# Patient Record
Sex: Female | Born: 1940 | Race: White | Hispanic: No | Smoking: Never smoker
Health system: Southern US, Community
[De-identification: ages and names within clinical notes are randomized; demographics above are authoritative.]

## PROBLEM LIST (undated history)

## (undated) DIAGNOSIS — M899 Disorder of bone, unspecified: Secondary | ICD-10-CM

## (undated) DIAGNOSIS — K219 Gastro-esophageal reflux disease without esophagitis: Secondary | ICD-10-CM

## (undated) DIAGNOSIS — A318 Other mycobacterial infections: Secondary | ICD-10-CM

## (undated) DIAGNOSIS — E039 Hypothyroidism, unspecified: Secondary | ICD-10-CM

## (undated) DIAGNOSIS — D131 Benign neoplasm of stomach: Secondary | ICD-10-CM

## (undated) DIAGNOSIS — M949 Disorder of cartilage, unspecified: Secondary | ICD-10-CM

## (undated) DIAGNOSIS — Z860100 Personal history of colon polyps, unspecified: Secondary | ICD-10-CM

## (undated) DIAGNOSIS — F411 Generalized anxiety disorder: Secondary | ICD-10-CM

## (undated) DIAGNOSIS — J479 Bronchiectasis, uncomplicated: Secondary | ICD-10-CM

## (undated) DIAGNOSIS — R51 Headache: Secondary | ICD-10-CM

## (undated) DIAGNOSIS — E785 Hyperlipidemia, unspecified: Secondary | ICD-10-CM

## (undated) DIAGNOSIS — M1990 Unspecified osteoarthritis, unspecified site: Secondary | ICD-10-CM

## (undated) DIAGNOSIS — M109 Gout, unspecified: Secondary | ICD-10-CM

## (undated) DIAGNOSIS — R519 Headache, unspecified: Secondary | ICD-10-CM

## (undated) DIAGNOSIS — J309 Allergic rhinitis, unspecified: Secondary | ICD-10-CM

## (undated) DIAGNOSIS — K589 Irritable bowel syndrome without diarrhea: Secondary | ICD-10-CM

## (undated) HISTORY — DX: Personal history of colonic polyps: Z86.010

## (undated) HISTORY — PX: UPPER GASTROINTESTINAL ENDOSCOPY: SHX188

## (undated) HISTORY — DX: Headache, unspecified: R51.9

## (undated) HISTORY — PX: CARPAL TUNNEL RELEASE: SHX101

## (undated) HISTORY — PX: COLONOSCOPY: SHX174

## (undated) HISTORY — DX: Gout, unspecified: M10.9

## (undated) HISTORY — DX: Headache: R51

## (undated) HISTORY — DX: Other mycobacterial infections: A31.8

## (undated) HISTORY — DX: Allergic rhinitis, unspecified: J30.9

## (undated) HISTORY — PX: BLADDER SUSPENSION: SHX72

## (undated) HISTORY — DX: Disorder of cartilage, unspecified: M94.9

## (undated) HISTORY — DX: Benign neoplasm of stomach: D13.1

## (undated) HISTORY — DX: Generalized anxiety disorder: F41.1

## (undated) HISTORY — DX: Irritable bowel syndrome, unspecified: K58.9

## (undated) HISTORY — PX: ABDOMINAL HYSTERECTOMY: SHX81

## (undated) HISTORY — DX: Hypothyroidism, unspecified: E03.9

## (undated) HISTORY — DX: Gastro-esophageal reflux disease without esophagitis: K21.9

## (undated) HISTORY — PX: THYROIDECTOMY: SHX17

## (undated) HISTORY — DX: Bronchiectasis, uncomplicated: J47.9

## (undated) HISTORY — DX: Personal history of colon polyps, unspecified: Z86.0100

## (undated) HISTORY — DX: Unspecified osteoarthritis, unspecified site: M19.90

## (undated) HISTORY — DX: Disorder of bone, unspecified: M89.9

## (undated) HISTORY — DX: Hyperlipidemia, unspecified: E78.5

---

## 2004-05-28 ENCOUNTER — Ambulatory Visit: Payer: Self-pay | Admitting: Pulmonary Disease

## 2004-07-02 ENCOUNTER — Ambulatory Visit: Payer: Self-pay | Admitting: Pulmonary Disease

## 2004-10-01 ENCOUNTER — Ambulatory Visit: Payer: Self-pay | Admitting: Pulmonary Disease

## 2004-10-10 ENCOUNTER — Ambulatory Visit: Payer: Self-pay | Admitting: Internal Medicine

## 2005-03-12 ENCOUNTER — Ambulatory Visit: Payer: Self-pay | Admitting: Critical Care Medicine

## 2005-04-07 ENCOUNTER — Ambulatory Visit: Payer: Self-pay | Admitting: Internal Medicine

## 2005-05-04 ENCOUNTER — Ambulatory Visit: Payer: Self-pay | Admitting: Gastroenterology

## 2005-05-21 ENCOUNTER — Ambulatory Visit: Payer: Self-pay | Admitting: Gastroenterology

## 2005-12-11 ENCOUNTER — Ambulatory Visit: Payer: Self-pay | Admitting: Internal Medicine

## 2006-01-08 ENCOUNTER — Ambulatory Visit: Payer: Self-pay | Admitting: Internal Medicine

## 2006-04-16 ENCOUNTER — Ambulatory Visit: Payer: Self-pay | Admitting: Internal Medicine

## 2006-04-16 LAB — CONVERTED CEMR LAB
AST: 35 units/L (ref 0–37)
VLDL: 13 mg/dL (ref 0–40)

## 2006-04-19 ENCOUNTER — Ambulatory Visit: Payer: Self-pay | Admitting: Internal Medicine

## 2006-07-15 ENCOUNTER — Ambulatory Visit: Payer: Self-pay | Admitting: Internal Medicine

## 2006-07-15 LAB — CONVERTED CEMR LAB
AST: 27 units/L (ref 0–37)
Albumin: 3.9 g/dL (ref 3.5–5.2)
Cholesterol: 196 mg/dL (ref 0–200)
Total CHOL/HDL Ratio: 3
VLDL: 15 mg/dL (ref 0–40)

## 2006-09-28 ENCOUNTER — Ambulatory Visit: Payer: Self-pay | Admitting: Internal Medicine

## 2007-07-11 ENCOUNTER — Encounter: Payer: Self-pay | Admitting: Internal Medicine

## 2007-07-11 DIAGNOSIS — M8580 Other specified disorders of bone density and structure, unspecified site: Secondary | ICD-10-CM

## 2007-07-11 DIAGNOSIS — Z860100 Personal history of colon polyps, unspecified: Secondary | ICD-10-CM | POA: Insufficient documentation

## 2007-07-11 DIAGNOSIS — E039 Hypothyroidism, unspecified: Secondary | ICD-10-CM

## 2007-07-11 DIAGNOSIS — E785 Hyperlipidemia, unspecified: Secondary | ICD-10-CM

## 2007-07-11 DIAGNOSIS — A318 Other mycobacterial infections: Secondary | ICD-10-CM | POA: Insufficient documentation

## 2007-09-22 ENCOUNTER — Encounter: Admission: RE | Admit: 2007-09-22 | Discharge: 2007-09-22 | Payer: Self-pay | Admitting: Family Medicine

## 2007-12-05 ENCOUNTER — Encounter: Payer: Self-pay | Admitting: Gastroenterology

## 2008-01-25 ENCOUNTER — Ambulatory Visit: Payer: Self-pay | Admitting: Internal Medicine

## 2008-01-25 ENCOUNTER — Telehealth: Payer: Self-pay | Admitting: Internal Medicine

## 2008-01-25 DIAGNOSIS — R042 Hemoptysis: Secondary | ICD-10-CM | POA: Insufficient documentation

## 2008-01-25 DIAGNOSIS — J309 Allergic rhinitis, unspecified: Secondary | ICD-10-CM | POA: Insufficient documentation

## 2008-01-25 LAB — CONVERTED CEMR LAB
BUN: 17 mg/dL (ref 6–23)
Basophils Relative: 0.4 % (ref 0.0–3.0)
CO2: 33 meq/L — ABNORMAL HIGH (ref 19–32)
Calcium: 9.6 mg/dL (ref 8.4–10.5)
Chloride: 107 meq/L (ref 96–112)
Creatinine, Ser: 0.7 mg/dL (ref 0.4–1.2)
Eosinophils Absolute: 0 10*3/uL (ref 0.0–0.7)
Eosinophils Relative: 0.7 % (ref 0.0–5.0)
GFR calc Af Amer: 107 mL/min
Glucose, Bld: 96 mg/dL (ref 70–99)
INR: 1 (ref 0.8–1.0)
MCV: 95.5 fL (ref 78.0–100.0)
Monocytes Absolute: 0.5 10*3/uL (ref 0.1–1.0)
Monocytes Relative: 7.4 % (ref 3.0–12.0)
Neutrophils Relative %: 58.5 % (ref 43.0–77.0)
Platelets: 180 10*3/uL (ref 150–400)
Prothrombin Time: 11.9 s (ref 10.9–13.3)
RBC: 4.18 M/uL (ref 3.87–5.11)
RDW: 12.4 % (ref 11.5–14.6)
WBC: 6.3 10*3/uL (ref 4.5–10.5)
aPTT: 29.3 s (ref 21.7–29.8)

## 2008-01-26 ENCOUNTER — Ambulatory Visit: Payer: Self-pay | Admitting: Cardiology

## 2008-02-16 ENCOUNTER — Ambulatory Visit: Payer: Self-pay | Admitting: Internal Medicine

## 2008-02-16 DIAGNOSIS — J479 Bronchiectasis, uncomplicated: Secondary | ICD-10-CM

## 2008-07-10 ENCOUNTER — Encounter: Payer: Self-pay | Admitting: Gastroenterology

## 2008-07-16 ENCOUNTER — Encounter: Payer: Self-pay | Admitting: Gastroenterology

## 2008-07-16 ENCOUNTER — Encounter: Admission: RE | Admit: 2008-07-16 | Discharge: 2008-07-16 | Payer: Self-pay | Admitting: Family Medicine

## 2008-07-23 ENCOUNTER — Telehealth: Payer: Self-pay | Admitting: Gastroenterology

## 2008-07-24 ENCOUNTER — Ambulatory Visit: Payer: Self-pay | Admitting: Gastroenterology

## 2008-07-24 DIAGNOSIS — M109 Gout, unspecified: Secondary | ICD-10-CM | POA: Insufficient documentation

## 2008-07-24 DIAGNOSIS — K648 Other hemorrhoids: Secondary | ICD-10-CM | POA: Insufficient documentation

## 2008-07-24 DIAGNOSIS — R1084 Generalized abdominal pain: Secondary | ICD-10-CM | POA: Insufficient documentation

## 2008-07-24 DIAGNOSIS — R109 Unspecified abdominal pain: Secondary | ICD-10-CM | POA: Insufficient documentation

## 2008-07-25 ENCOUNTER — Encounter: Payer: Self-pay | Admitting: Physician Assistant

## 2008-07-25 ENCOUNTER — Ambulatory Visit: Payer: Self-pay | Admitting: Cardiology

## 2008-07-25 LAB — CONVERTED CEMR LAB
ALT: 23 units/L (ref 0–35)
AST: 27 units/L (ref 0–37)
Alkaline Phosphatase: 86 units/L (ref 39–117)
BUN: 15 mg/dL (ref 6–23)
Basophils Absolute: 0 10*3/uL (ref 0.0–0.1)
CO2: 32 meq/L (ref 19–32)
Calcium: 9.9 mg/dL (ref 8.4–10.5)
Chloride: 103 meq/L (ref 96–112)
GFR calc Af Amer: 80 mL/min
GFR calc non Af Amer: 66 mL/min
Glucose, Bld: 101 mg/dL — ABNORMAL HIGH (ref 70–99)
HCT: 39.7 % (ref 36.0–46.0)
Hemoglobin: 13.5 g/dL (ref 12.0–15.0)
Leukocytes, UA: NEGATIVE
Monocytes Relative: 7 % (ref 3.0–12.0)
Mucus, UA: NEGATIVE
Potassium: 4.5 meq/L (ref 3.5–5.1)
RDW: 12.2 % (ref 11.5–14.6)
Sodium: 142 meq/L (ref 135–145)
Specific Gravity, Urine: 1.015 (ref 1.000–1.035)
Total Bilirubin: 0.8 mg/dL (ref 0.3–1.2)
Total Protein, Urine: NEGATIVE mg/dL

## 2008-07-26 ENCOUNTER — Telehealth: Payer: Self-pay | Admitting: Physician Assistant

## 2008-08-14 ENCOUNTER — Ambulatory Visit: Payer: Self-pay | Admitting: Internal Medicine

## 2008-08-15 ENCOUNTER — Ambulatory Visit: Payer: Self-pay | Admitting: Gastroenterology

## 2008-10-03 ENCOUNTER — Encounter: Admission: RE | Admit: 2008-10-03 | Discharge: 2008-10-03 | Payer: Self-pay | Admitting: Family Medicine

## 2008-10-03 ENCOUNTER — Telehealth (INDEPENDENT_AMBULATORY_CARE_PROVIDER_SITE_OTHER): Payer: Self-pay | Admitting: *Deleted

## 2008-10-04 ENCOUNTER — Ambulatory Visit: Payer: Self-pay | Admitting: Internal Medicine

## 2008-10-25 ENCOUNTER — Telehealth (INDEPENDENT_AMBULATORY_CARE_PROVIDER_SITE_OTHER): Payer: Self-pay | Admitting: *Deleted

## 2008-12-27 ENCOUNTER — Ambulatory Visit: Payer: Self-pay | Admitting: Internal Medicine

## 2008-12-29 ENCOUNTER — Encounter: Payer: Self-pay | Admitting: Internal Medicine

## 2009-01-07 ENCOUNTER — Telehealth (INDEPENDENT_AMBULATORY_CARE_PROVIDER_SITE_OTHER): Payer: Self-pay | Admitting: *Deleted

## 2009-01-09 ENCOUNTER — Ambulatory Visit: Payer: Self-pay | Admitting: Internal Medicine

## 2009-01-10 ENCOUNTER — Encounter: Payer: Self-pay | Admitting: Internal Medicine

## 2009-01-18 ENCOUNTER — Ambulatory Visit: Payer: Self-pay | Admitting: Internal Medicine

## 2009-01-30 ENCOUNTER — Telehealth: Payer: Self-pay | Admitting: Internal Medicine

## 2009-02-01 ENCOUNTER — Telehealth: Payer: Self-pay | Admitting: Internal Medicine

## 2009-02-20 ENCOUNTER — Telehealth: Payer: Self-pay | Admitting: Gastroenterology

## 2009-02-21 ENCOUNTER — Ambulatory Visit: Payer: Self-pay | Admitting: Internal Medicine

## 2009-02-21 DIAGNOSIS — R142 Eructation: Secondary | ICD-10-CM

## 2009-02-21 DIAGNOSIS — R143 Flatulence: Secondary | ICD-10-CM

## 2009-02-21 DIAGNOSIS — K219 Gastro-esophageal reflux disease without esophagitis: Secondary | ICD-10-CM

## 2009-02-21 DIAGNOSIS — R141 Gas pain: Secondary | ICD-10-CM

## 2009-02-21 DIAGNOSIS — R197 Diarrhea, unspecified: Secondary | ICD-10-CM

## 2009-02-22 ENCOUNTER — Encounter: Payer: Self-pay | Admitting: Physician Assistant

## 2009-02-26 ENCOUNTER — Telehealth (INDEPENDENT_AMBULATORY_CARE_PROVIDER_SITE_OTHER): Payer: Self-pay | Admitting: *Deleted

## 2009-03-01 ENCOUNTER — Telehealth (INDEPENDENT_AMBULATORY_CARE_PROVIDER_SITE_OTHER): Payer: Self-pay | Admitting: Physician Assistant

## 2009-03-01 ENCOUNTER — Encounter (INDEPENDENT_AMBULATORY_CARE_PROVIDER_SITE_OTHER): Payer: Self-pay | Admitting: *Deleted

## 2009-03-01 ENCOUNTER — Emergency Department (HOSPITAL_COMMUNITY): Admission: EM | Admit: 2009-03-01 | Discharge: 2009-03-01 | Payer: Self-pay | Admitting: Emergency Medicine

## 2009-03-04 ENCOUNTER — Ambulatory Visit: Payer: Self-pay | Admitting: Gastroenterology

## 2009-03-11 ENCOUNTER — Ambulatory Visit (HOSPITAL_COMMUNITY): Admission: RE | Admit: 2009-03-11 | Discharge: 2009-03-11 | Payer: Self-pay | Admitting: Gastroenterology

## 2009-03-14 ENCOUNTER — Ambulatory Visit: Payer: Self-pay | Admitting: Cardiology

## 2009-03-14 DIAGNOSIS — R072 Precordial pain: Secondary | ICD-10-CM | POA: Insufficient documentation

## 2009-03-20 ENCOUNTER — Telehealth (INDEPENDENT_AMBULATORY_CARE_PROVIDER_SITE_OTHER): Payer: Self-pay | Admitting: *Deleted

## 2009-03-21 ENCOUNTER — Encounter (HOSPITAL_COMMUNITY): Admission: RE | Admit: 2009-03-21 | Discharge: 2009-05-14 | Payer: Self-pay | Admitting: Cardiology

## 2009-03-21 ENCOUNTER — Ambulatory Visit: Payer: Self-pay | Admitting: Internal Medicine

## 2009-03-21 ENCOUNTER — Ambulatory Visit: Payer: Self-pay | Admitting: Cardiology

## 2009-03-21 ENCOUNTER — Ambulatory Visit: Payer: Self-pay

## 2009-03-27 ENCOUNTER — Ambulatory Visit: Payer: Self-pay | Admitting: Internal Medicine

## 2009-04-01 ENCOUNTER — Encounter: Payer: Self-pay | Admitting: Cardiology

## 2009-04-01 LAB — CONVERTED CEMR LAB
Albumin: 4 g/dL (ref 3.5–5.2)
Bilirubin, Direct: 0.1 mg/dL (ref 0.0–0.3)
Direct LDL: 150.8 mg/dL
Total Bilirubin: 0.7 mg/dL (ref 0.3–1.2)
Total CHOL/HDL Ratio: 4
Total Protein: 7.8 g/dL (ref 6.0–8.3)
VLDL: 16.4 mg/dL (ref 0.0–40.0)

## 2009-05-27 ENCOUNTER — Ambulatory Visit: Payer: Self-pay | Admitting: Cardiology

## 2009-06-04 LAB — CONVERTED CEMR LAB
Albumin: 3.7 g/dL (ref 3.5–5.2)
Alkaline Phosphatase: 84 units/L (ref 39–117)
Cholesterol: 149 mg/dL (ref 0–200)
HDL: 61.4 mg/dL (ref >39.00)
LDL Cholesterol: 77 mg/dL (ref 0–99)
VLDL: 10.6 mg/dL (ref 0.0–40.0)

## 2009-07-11 ENCOUNTER — Ambulatory Visit: Payer: Self-pay | Admitting: Gastroenterology

## 2009-08-02 ENCOUNTER — Ambulatory Visit: Payer: Self-pay | Admitting: Internal Medicine

## 2009-08-06 ENCOUNTER — Telehealth: Payer: Self-pay | Admitting: Physician Assistant

## 2009-08-07 ENCOUNTER — Ambulatory Visit: Payer: Self-pay | Admitting: Gastroenterology

## 2009-08-07 DIAGNOSIS — K589 Irritable bowel syndrome without diarrhea: Secondary | ICD-10-CM

## 2009-08-07 DIAGNOSIS — F411 Generalized anxiety disorder: Secondary | ICD-10-CM | POA: Insufficient documentation

## 2009-09-02 ENCOUNTER — Telehealth: Payer: Self-pay | Admitting: Internal Medicine

## 2009-09-03 ENCOUNTER — Ambulatory Visit: Payer: Self-pay | Admitting: Internal Medicine

## 2009-09-16 ENCOUNTER — Telehealth (INDEPENDENT_AMBULATORY_CARE_PROVIDER_SITE_OTHER): Payer: Self-pay | Admitting: *Deleted

## 2009-10-31 ENCOUNTER — Encounter: Admission: RE | Admit: 2009-10-31 | Discharge: 2009-10-31 | Payer: Self-pay | Admitting: Internal Medicine

## 2009-10-31 LAB — HM MAMMOGRAPHY: HM Mammogram: NEGATIVE

## 2009-12-17 ENCOUNTER — Ambulatory Visit: Payer: Self-pay | Admitting: Internal Medicine

## 2009-12-24 ENCOUNTER — Telehealth: Payer: Self-pay | Admitting: Internal Medicine

## 2009-12-30 ENCOUNTER — Telehealth (INDEPENDENT_AMBULATORY_CARE_PROVIDER_SITE_OTHER): Payer: Self-pay | Admitting: *Deleted

## 2010-01-14 ENCOUNTER — Ambulatory Visit: Payer: Self-pay | Admitting: Internal Medicine

## 2010-01-14 LAB — CONVERTED CEMR LAB: Cholesterol: 195 mg/dL (ref 0–200)

## 2010-01-21 ENCOUNTER — Telehealth (INDEPENDENT_AMBULATORY_CARE_PROVIDER_SITE_OTHER): Payer: Self-pay | Admitting: *Deleted

## 2010-01-21 ENCOUNTER — Ambulatory Visit: Payer: Self-pay | Admitting: Internal Medicine

## 2010-01-21 DIAGNOSIS — R042 Hemoptysis: Secondary | ICD-10-CM | POA: Insufficient documentation

## 2010-01-21 LAB — CONVERTED CEMR LAB
Basophils Relative: 0.3 % (ref 0.0–3.0)
Eosinophils Absolute: 0 10*3/uL (ref 0.0–0.7)
Eosinophils Relative: 0.5 % (ref 0.0–5.0)
Lymphocytes Relative: 22.1 % (ref 12.0–46.0)
MCV: 94.9 fL (ref 78.0–100.0)
Monocytes Absolute: 0.7 10*3/uL (ref 0.1–1.0)
Monocytes Relative: 7.4 % (ref 3.0–12.0)
Neutro Abs: 6.6 10*3/uL (ref 1.4–7.7)
Platelets: 177 10*3/uL (ref 150.0–400.0)
RBC: 3.92 M/uL (ref 3.87–5.11)
WBC: 9.5 10*3/uL (ref 4.5–10.5)

## 2010-02-03 ENCOUNTER — Ambulatory Visit: Payer: Self-pay | Admitting: Internal Medicine

## 2010-05-07 ENCOUNTER — Telehealth: Payer: Self-pay | Admitting: Internal Medicine

## 2010-05-21 ENCOUNTER — Ambulatory Visit
Admission: RE | Admit: 2010-05-21 | Discharge: 2010-05-21 | Payer: Self-pay | Source: Home / Self Care | Attending: Internal Medicine | Admitting: Internal Medicine

## 2010-05-21 ENCOUNTER — Telehealth: Payer: Self-pay | Admitting: Internal Medicine

## 2010-06-08 ENCOUNTER — Encounter: Payer: Self-pay | Admitting: Internal Medicine

## 2010-06-10 ENCOUNTER — Encounter: Payer: Self-pay | Admitting: Internal Medicine

## 2010-06-17 NOTE — Assessment & Plan Note (Signed)
Summary: 4 MO ROV /NWS   Vital Signs:  Patient profile:   70 year old female Height:      63 inches (160.02 cm) Weight:      117.4 pounds (53.36 kg) O2 Sat:      96 % on Room air Temp:     97.8 degrees F (36.56 degrees C) oral Pulse rate:   77 / minute BP sitting:   102 / 62  (left arm) Cuff size:   regular  Vitals Entered By: Orlan Leavens RMA (January 14, 2010 10:20 AM)  O2 Flow:  Room air CC: 4 month follow-up Is Patient Diabetic? No Pain Assessment Patient in pain? no        Primary Care Kamariyah Timberlake:  Newt Lukes MD  CC:  4 month follow-up.  History of Present Illness:  here for 4 month f/u  review chronic med issues and medications: GERD - follows with GI ([facility] for [facility]) - a/w generalized chroinc abd pain bentyl no longer used due to not being filled by insurance d/t "high risk" med category  hypothyroid -  reports compliance with ongoing medical treatment and no changes in medication dose or frequency. denies adverse side effects related to current therapy.  no weight or bowel changes  dyslipidemia - reports compliance with ongoing medical treatment and no changes in medication dose or frequency. denies adverse side effects related to current therapy.  no myalgias or new GI pain  chronic low BP - sbp 90-100 usual  anxiety - uses Valium on as needed basis   Preventive Screening-Counseling & Management  Alcohol-Tobacco     Smoking Status: never  Clinical Review Panels:  Prevention   Last Mammogram:  ASSESSMENT: Negative - BI-RADS 1^MM DIGITAL SCREENING (10/31/2009)   Last Colonoscopy:  Normal (05/21/2005)  Immunizations   Last Tetanus Booster:  Td (03/27/2009)   Last Flu Vaccine:  Historical (03/25/2009)   Last Pneumovax:  Pneumovax (08/14/2008)  Lipid Management   Cholesterol:  149 (05/27/2009)   LDL (bad choesterol):  77 (05/27/2009)   HDL (good cholesterol):  61.40 (05/27/2009)   Triglycerides:  67 (04/16/2006)  CBC   WBC:  5.6  (07/24/2008)   RBC:  4.20 (07/24/2008)   Hgb:  13.5 (07/24/2008)   Hct:  39.7 (07/24/2008)   Platelets:  175 (07/24/2008)   MCV  94.7 (07/24/2008)   MCHC  34.0 (07/24/2008)   RDW  12.2 (07/24/2008)   PMN:  65.7 (07/24/2008)   Lymphs:  26.3 (07/24/2008)   Monos:  7.0 (07/24/2008)   Eosinophils:  0.7 (07/24/2008)   Basophil:  0.3 (07/24/2008)  Complete Metabolic Panel   Glucose:  101 (07/24/2008)   Sodium:  142 (07/24/2008)   Potassium:  4.5 (07/24/2008)   Chloride:  103 (07/24/2008)   CO2:  32 (07/24/2008)   BUN:  15 (07/24/2008)   Creatinine:  0.9 (07/24/2008)   Albumin:  3.7 (05/27/2009)   Total Protein:  7.8 (05/27/2009)   Calcium:  9.9 (07/24/2008)   Total Bili:  0.8 (05/27/2009)   Alk Phos:  84 (05/27/2009)   SGPT (ALT):  18 (05/27/2009)   SGOT (AST):  27 (05/27/2009)   Current Medications (verified): 1)  Axid 150 Mg Caps (Nizatidine) .... Take One By Mouth Two Times A Day 2)  Simvastatin 40 Mg Tabs (Simvastatin) .... Take One Tablet By Mouth Daily At Bedtime 3)  Multivitamins   Tabs (Multiple Vitamin) .... Take 1 Tablet By Mouth Once A Day 4)  Omega-3 1000 Mg  Caps (Omega-3 Fatty  Acids) .... Take 1 Tablet By Mouth Once A Day 5)  Calcium 600 600 Mg  Tabs (Calcium Carbonate) .... Take 1 Tablet By Mouth Once A Day 6)  Synthroid 50 Mcg Tabs (Levothyroxine Sodium) .... Take 1 Tab Every Other Day (Alternating With On The Other Days) 7)  Robinul-Forte 2 Mg Tabs (Glycopyrrolate) .... Take 1 Daily For Spams and Abd Discomfort 8)  Benzonatate 100 Mg Caps (Benzonatate) .Marland Kitchen.. 1 or 2 Four Times A Day As Needed Cough 9)  Nitrogylcerin .... As Needed 10)  Gas-X Extra Strength 125 Mg Caps (Simethicone) .... Take One By Mouth Before Meals As Needed 11)  Synthroid 75 Mcg Tabs (Levothyroxine Sodium) .... Take 1 Tab Every Other Day (Alternating With On The Other Days) 12)  Triamcinolone Acetonide 0.1 % Crea (Triamcinolone Acetonide) .... Apply 1-2 Times A Day 13)  Diazepam 5  Mg Tabs (Diazepam) .... Take 1 By Mouth Once Daily As Needed  Allergies (verified): 1)  ! Avelox 2)  ! Ethambutol Hcl (Ethambutol Hcl)  Past History:  Past medical, surgical, family and social histories (including risk factors) reviewed, and no changes noted (except as noted below).  Past Medical History: Pneumonia 2004, 2009 - pneumovax done GERD colon polyps, 1999 /NORMAL COLON 2007 BRONCHIECTASIS- M.avium 2004, hemoptysis 2009 hypothyroid Allergic Rhinitis HYPERLIPIDEMIA  MD roster: [facility]  Past Surgical History: Reviewed history from 08/15/2008 and no changes required. Thyroidectomy for Hurthle Cell tumor Hysterectomy S/P BLADDER SUSPENSION Carpal Tunnel Release  Family History: Reviewed history from 03/14/2009 and no changes required. Mother - cervical cancer age 51 No FH of Colon Cancer: father died of a heart attack at age 47  Social History: Reviewed history from 07/24/2008 and no changes required. 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  Alcohol Use - no Daily Caffeine Use Illicit Drug Use - no  Review of Systems  The patient denies fever, chest pain, syncope, and peripheral edema.    Physical Exam  General:  alert, well-developed, well-nourished, and cooperative to examination.   nontoxic Lungs:  normal respiratory effort, no intercostal retractions or use of accessory muscles; normal breath sounds bilaterally - no crackles and no wheezes.    Heart:  normal rate, regular rhythm, no murmur, and no rub. BLE without edema.    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  Problem # 2:  HYPERLIPIDEMIA (ICD-272.4)  Her updated medication list for this problem includes:    Simvastatin 40 Mg Tabs (Simvastatin) .Marland Kitchen... Take one tablet by mouth daily at bedtime  Orders: TLB-Lipid Panel (80061-LIPID)  Labs Reviewed: SGOT: 27 (05/27/2009)   SGPT: 18 (05/27/2009)   HDL:61.40 (05/27/2009), 52.20 (03/21/2009)  LDL:77 (05/27/2009), 116 (95/62/1308)  Chol:149 (05/27/2009), 214 (03/21/2009)  Trig:53.0 (05/27/2009), 82.0 (03/21/2009)  Problem # 3:  HYPOTHYROIDISM (ICD-244.9)  Her updated medication list for this problem includes:    Synthroid 50 Mcg Tabs (Levothyroxine sodium) .Marland Kitchen... Take 1 tab every other day (alternating with on the other days)    Synthroid 75 Mcg Tabs (  Levothyroxine sodium) .Marland Kitchen... Take 1 tab every other day (alternating with on the other days)  Orders: TLB-TSH (Thyroid Stimulating Hormone) (84443-TSH)  Labs Reviewed: TSH: 1.57 (03/21/2009)    Chol: 149 (05/27/2009)   HDL: 61.40 (05/27/2009)   LDL: 77 (05/27/2009)   TG: 53.0 (05/27/2009)  Problem # 4:  GERD (ICD-530.81)  The following medications were removed from the medication list:    Bentyl 10 Mg Caps (Dicyclomine hcl) .Marland Kitchen... Take 1 tab in the evening Her updated medication list for this problem includes:    Axid 150 Mg Caps (Nizatidine) .Marland Kitchen... Take one by mouth two times a day    Robinul-forte 2 Mg Tabs (Glycopyrrolate) .Marland Kitchen... Take 1 daily for spams and abd discomfort  Labs Reviewed: Hgb: 13.5 (07/24/2008)   Hct: 39.7 (07/24/2008)  Problem # 5:  BRONCHIECTASIS (ICD-494.0)  recent exacerbation of bronchiectasis/ bronchitis. intol of biaxin - control cough symptoms as ongoing - afeb and nontox  Complete Medication List: 1)  Axid 150 Mg Caps (Nizatidine) .... Take one by mouth two times a day 2)  Simvastatin 40 Mg Tabs (Simvastatin) .... Take one tablet by mouth daily at  bedtime 3)  Multivitamins Tabs (Multiple vitamin) .... Take 1 tablet by mouth once a day 4)  Omega-3 1000 Mg Caps (Omega-3 fatty acids) .... Take 1 tablet by mouth once a day 5)  Calcium 600 600 Mg Tabs (Calcium carbonate) .... Take 1 tablet by mouth once a day 6)  Robinul-forte 2 Mg Tabs (Glycopyrrolate) .... Take 1 daily for spams and abd discomfort 7)  Benzonatate 100 Mg Caps (Benzonatate) .Marland Kitchen.. 1 or 2 four times a day as needed cough 8)  Nitrogylcerin  .... As needed 9)  Gas-x Extra Strength 125 Mg Caps (Simethicone) .... Take one by mouth before meals as needed 10)  Synthroid 50 Mcg Tabs (Levothyroxine sodium) .... Take 1 tab every other day (alternating with on the other days) 11)  Synthroid 75 Mcg Tabs (Levothyroxine sodium) .... Take 1 tab every other day (alternating with on the other days) 12)  Triamcinolone Acetonide 0.1 % Crea (Triamcinolone acetonide) .... Apply 1-2 times a day 13)  Diazepam 5 Mg Tabs (Diazepam) .... Take 1 by mouth once daily as needed  Patient Instructions: 1)  it was good to see you today.  2)  medications reviewed  - no substitution for bentyl needed at this time 3)  test(s) ordered today - your results will be posted on the phone tree for review in 48-72 hours from the time of test completion; call [phone] and enter your 9 digit MRN (listed above on this page, just below your name); if any changes need to be made or there are abnormal results, you will be contacted directly. 4)  Please schedule a follow-up appointment in 4-6 months, sooner if problems.   Appended Document: 4 MO ROV /NWS - refill Medications Added FLUCONAZOLE 100 MG TABS (FLUCONAZOLE) 1 by mouth once daily as needed for yeast symptoms          Clinical Lists Changes  Medications: Added new medication of FLUCONAZOLE 100 MG TABS (FLUCONAZOLE) 1 by mouth once daily as needed for yeast symptoms - Signed Rx of FLUCONAZOLE 100 MG TABS (FLUCONAZOLE) 1 by mouth once daily as  needed for yeast symptoms;  #3 x 1;  Signed;  Entered by: Newt Lukes MD;  Authorized by: Newt Lukes MD;  Method used: [facility] [phone]*, [address],  [place], [place]  [address], Ph: [phone] or [phone], Fax: [phone]    Prescriptions: FLUCONAZOLE 100 MG TABS (FLUCONAZOLE) 1 by mouth once daily as needed for yeast symptoms  #3 x 1   Entered and Authorized by:   Newt Lukes MD   Signed by:   Newt Lukes MD on 01/14/2010   Method used:   [facility]        [facility]  [place] [address] ([facility]ail[facility]       [address]       [place], [place]  [address]       Ph: [phone] or [phone]       Fax: [phone]   RxID:   [id number]

## 2010-06-17 NOTE — Progress Notes (Signed)
Summary: medication issues  Phone Note Call from Patient Call back at Home Phone [phone]   Caller: Patient Call For: Meily Glowacki Summary of Call: Pt states that she can't tolerate the clarithromycin, it's causing her to have leg and stomach cramps since 8/7, also she says she no longer taking dicyclomine, states it's too high risk.//[facility] Initial call taken by: Darletta Moll,  December 24, 2009 1:40 PM  Follow-up for Phone Call        Pt c/o stomach cramps, left leg and left arm cramping, with some diarrhea starting Sunday. Pt denies nausea and vomiting. Pt states cough is less with small amounts of light yellow to clear mucus. Pt wants to know if CY wants her to d/c the Clarithromycin? Pt has not taken today's dose. Please advise. Thanks. Zackery Barefoot CMA  December 24, 2009 2:04 PM    Allergies (verified):  1)  ! Avelox 2)  ! Ethambutol Hcl (Ethambutol Hcl)   Additional Follow-up for Phone Call Additional follow up Details #1::        Per CDY- ok to stop clarithromycin.Reynaldo Minium CMA  December 24, 2009 4:56 PM   Pt informed of above recs. Zackery Barefoot CMA  December 24, 2009 5:00 PM

## 2010-06-17 NOTE — Progress Notes (Signed)
Summary: sick/cough  Phone Note Call from Patient Call back at Home Phone [phone]   Caller: Marilyn Blake Call For: Marilyn Blake Reason for Call: Talk to Nurse Complaint: Cough/Sore throat, Nausea/Vomiting/Diarrhea Summary of Call: coughing up a lot of plegm(brownish), not feeling well, temp 103, going on a few days, taking tylenol, not keeping it away.  Please respond.  Feeling nauseated, no vomiting yet. Initial call taken by: Eugene Gavia,  Sep 16, 2009 11:52 AM  Follow-up for Phone Call        pt c/o productive cough with green phlegm, fever of 103, fever with tylenol is 101.8. she also c/o nausea. she states she was around her grandson this weekend and he had similar symptoms. Pt has had symptoms x 2 days.  Pt has some tessalon perles for the cough at home, but is req. abx or whatever CY recs. [facility]. Pelase advise. Carron Curie CMA  Sep 16, 2009 12:33 PM allergies: avelox, ethambutol hcl   Additional Follow-up for Phone Call Additional follow up Details #1::        Offer Z pak  Additional Follow-up by: Waymon Budge MD,  Sep 16, 2009 1:07 PM    Additional Follow-up for Phone Call Additional follow up Details #2::    Spoke with pt and advised we will send rx for zpack. [facility]. Follow-up by: Vernie Murders,  Sep 16, 2009 2:01 PM  New/Updated Medications: ZITHROMAX Z-PAK 250 MG TABS (AZITHROMYCIN) take as directed Prescriptions: ZITHROMAX Z-PAK 250 MG TABS (AZITHROMYCIN) take as directed  #1 x 0   Entered by:   Vernie Murders   Authorized by:   Waymon Budge MD   Signed by:   Vernie Murders on 09/16/2009   Method used:   [facility]        [facility]  [place] [address] [facility]       [address]       [place], [place]  [address]       Ph: [phone] or [phone]       Fax: [phone]   RxID:   [id number]

## 2010-06-17 NOTE — Progress Notes (Signed)
Summary: Rx refill request/ diazepam  Phone Note Call from Patient Call back at Home Phone [phone]   Caller: Patient Summary of Call: pt  called requesting refills of Diazepam. Pt says that she has been getting refills from previous MD. please advise Initial call taken by: Margaret Pyle, CMA,  September 02, 2009 11:08 AM  Follow-up for Phone Call        [facility]. refill diazepam 5 mg take 1 once daily as needed # 30. Last filled 05/31/09. Is this ok to refill? Follow-up by: Orlan Leavens,  September 02, 2009 11:14 AM  Additional Follow-up for Phone Call Additional follow up Details #1::        ok to fill as requested, #30, 1 refill - thanks Additional Follow-up by: Newt Lukes MD,  September 02, 2009 12:06 PM    Additional Follow-up for Phone Call Additional follow up Details #2::    [facility] Kim/pharmacist x's 2.  Follow-up by: Orlan Leavens,  September 02, 2009 12:52 PM  New/Updated Medications: DIAZEPAM 5 MG TABS (DIAZEPAM) take 1 by mouth once daily as needed Prescriptions: DIAZEPAM 5 MG TABS (DIAZEPAM) take 1 by mouth once daily as needed  #30 x 1   Entered by:   Orlan Leavens   Authorized by:   Newt Lukes MD   Signed by:   Orlan Leavens on 09/02/2009   Method used:   Telephoned to ...       [facility]  [place] [address] ([facility]ail[facility]       [address]       [place], [place]  [address]       Ph: [phone] or [phone]       Fax: [phone]   RxID:   [id number]

## 2010-06-17 NOTE — Progress Notes (Signed)
Summary: Talk to [facility]   Phone Note Call from Patient Call back at Home Phone [phone]   Call For: Mike Gip, [place] Summary of Call: Still having stomach issues-wanders what tests she had done. Initial call taken by: Leanor Kail [facility],  August 06, 2009 4:22 PM  Follow-up for Phone Call        I explained to the pt when she saw Havyn Ramo PA in Oct 2010 she ordered an [place] and it was normal.  Pt is having burning and discomfort.  I mentioned she can use the Hyoscyamine that Dr. Arlyce Dice perscribed in Feb 2011.   She asked to see Roanne Haye. Made her an appt for tom 08-07-09 at 11AM. Follow-up by: Joselyn Glassman,  August 06, 2009 4:47 PM

## 2010-06-17 NOTE — Assessment & Plan Note (Signed)
Summary: MED REFILL-NEED TO BE SEEN PER PT BEFORE REFILL--STC   Vital Signs:  Patient profile:   70 year old female Height:      63 inches (160.02 cm) Weight:      120.12 pounds (54.60 kg) O2 Sat:      98 % on Room air Temp:     98.1 degrees F (36.72 degrees C) oral Pulse rate:   73 / minute BP sitting:   112 / 64  (left arm) Cuff size:   regular  Vitals Entered By: Orlan Leavens (September 03, 2009 11:05 AM)  O2 Flow:  Room air CC: follow-up visit Is Patient Diabetic? No Pain Assessment Patient in pain? no        Primary Care Provider:  Newt Lukes MD  CC:  follow-up visit.  History of Present Illness: here for 4 month f/u wants to review medications  GERD - follows with GI ([facility] for [facility]) - a/w generalized chroinc abd pain  hypothyroid -  reports compliance with ongoing medical treatment and no changes in medication dose or frequency. denies adverse side effects related to current therapy.   dyslipidemia - reports compliance with ongoing medical treatment and no changes in medication dose or frequency. denies adverse side effects related to current therapy.   chronic low BP - sbp 90-100 usual  anxiety - uses Valium on as needed basis  Clinical Review Panels:  Immunizations   Last Tetanus Booster:  Td (03/27/2009)   Last Flu Vaccine:  Historical (03/25/2009)   Last Pneumovax:  Pneumovax (08/14/2008)  Lipid Management   Cholesterol:  149 (05/27/2009)   LDL (bad choesterol):  77 (05/27/2009)   HDL (good cholesterol):  61.40 (05/27/2009)   Triglycerides:  67 (04/16/2006)  CBC   WBC:  5.6 (07/24/2008)   RBC:  4.20 (07/24/2008)   Hgb:  13.5 (07/24/2008)   Hct:  39.7 (07/24/2008)   Platelets:  175 (07/24/2008)   MCV  94.7 (07/24/2008)   MCHC  34.0 (07/24/2008)   RDW  12.2 (07/24/2008)   PMN:  65.7 (07/24/2008)   Lymphs:  26.3 (07/24/2008)   Monos:  7.0 (07/24/2008)   Eosinophils:  0.7 (07/24/2008)   Basophil:  0.3 (07/24/2008)  Complete  Metabolic Panel   Glucose:  101 (07/24/2008)   Sodium:  142 (07/24/2008)   Potassium:  4.5 (07/24/2008)   Chloride:  103 (07/24/2008)   CO2:  32 (07/24/2008)   BUN:  15 (07/24/2008)   Creatinine:  0.9 (07/24/2008)   Albumin:  3.7 (05/27/2009)   Total Protein:  7.8 (05/27/2009)   Calcium:  9.9 (07/24/2008)   Total Bili:  0.8 (05/27/2009)   Alk Phos:  84 (05/27/2009)   SGPT (ALT):  18 (05/27/2009)   SGOT (AST):  27 (05/27/2009)   Current Medications (verified): 1)  Axid 150 Mg Caps (Nizatidine) .... Take One By Mouth Two Times A Day 2)  Simvastatin 40 Mg Tabs (Simvastatin) .... Take One Tablet By Mouth Daily At Bedtime 3)  Multivitamins   Tabs (Multiple Vitamin) .... Take 1 Tablet By Mouth Once A Day 4)  Omega-3 1000 Mg  Caps (Omega-3 Fatty Acids) .... Take 1 Tablet By Mouth Once A Day 5)  Calcium 600 600 Mg  Tabs (Calcium Carbonate) .... Take 1 Tablet By Mouth Once A Day 6)  Synthroid 50 Mcg Tabs (Levothyroxine Sodium) .... Take 1 Tab Every Other Day (Alternating With On The Other Days) 7)  Robinul-Forte 2 Mg Tabs (Glycopyrrolate) .... Take 1 Daily For Spams and  Abd Discomfort 8)  Benzonatate 100 Mg Caps (Benzonatate) .Marland Kitchen.. 1 or 2 Four Times A Day As Needed Cough 9)  Nitrogylcerin .... As Needed 10)  Gas-X Extra Strength 125 Mg Caps (Simethicone) .... Take One By Mouth Before Meals As Needed 11)  Synthroid 75 Mcg Tabs (Levothyroxine Sodium) .... Take 1 Tab Every Other Day (Alternating With On The Other Days) 12)  Triamcinolone Acetonide 0.1 % Crea (Triamcinolone Acetonide) .... Apply 1-2 Times A Day 13)  Florastor 250 Mg Caps (Saccharomyces Boulardii) .... Take 1 Tab Twice Daily X 30 Days. 14)  Bentyl 10 Mg Caps (Dicyclomine Hcl) .... Take 1 Tab in The Evening 15)  Miralax  Powd (Polyethylene Glycol 3350) .... Use Daily As Needed For Constipation 16)  Diazepam 5 Mg Tabs (Diazepam) .... Take 1 By Mouth Once Daily As Needed  Allergies (verified): 1)  ! Avelox 2)  !  Ethambutol Hcl (Ethambutol Hcl)  Past History:  Past Medical History: Pneumonia 2004, 2009 - pneumovax done GERD colon polyps, 1999 /NORMAL COLON 2007 BRONCHIECTASIS- M.avium 2004, hemoptysis 2009 hypothyroidism Allergic Rhinitis HYPERLIPIDEMIA  MD rooster: [facility]  Review of Systems  The patient denies anorexia, fever, weight loss, chest pain, and syncope.    Physical Exam  General:  alert, well-developed, well-nourished, and cooperative to examination.    Lungs:  normal respiratory effort, no intercostal retractions or use of accessory muscles; normal breath sounds bilaterally - no crackles and no wheezes.    Heart:  normal rate, regular rhythm, no murmur, and no rub. BLE without edema.  Abdomen:  soft, non-tender, normal bowel sounds, no distention; no masses and no appreciable hepatomegaly or splenomegaly.     Impression & Recommendations:  Problem # 1:  HYPOTHYROIDISM (ICD-244.9)  Her updated medication list for this problem includes:    Synthroid 50 Mcg Tabs (Levothyroxine sodium) .Marland Kitchen... Take 1 tab every other day (alternating with on the other days)    Synthroid 75 Mcg Tabs (Levothyroxine sodium) .Marland Kitchen... Take 1 tab every other day (alternating with on the other days)  Labs Reviewed: TSH: 1.57 (03/21/2009)    Chol: 149 (05/27/2009)   HDL: 61.40 (05/27/2009)   LDL: 77 (05/27/2009)   TG: 53.0 (05/27/2009)  Problem # 2:  HYPERLIPIDEMIA (ICD-272.4)  Her updated medication list for this problem includes:    Simvastatin 40 Mg Tabs (Simvastatin) .Marland Kitchen... 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  Problem # 4:  ANXIETY (ICD-300.00) will cont valium as needed - The following medications were removed from the medication list:    Valium 5 Mg Tabs (Diazepam) .Marland Kitchen... Take 1/2  tablet by mouth once  every 6 hours as needed Her updated medication list for this problem includes:    Diazepam 5 Mg Tabs (Diazepam) .Marland Kitchen... Take 1 by mouth once daily as needed  Complete Medication List: 1)  Axid  150 Mg Caps (Nizatidine) .... Take one by mouth two times a day 2)  Simvastatin 40 Mg Tabs (Simvastatin) .... Take one tablet by mouth daily at bedtime 3)  Multivitamins Tabs (Multiple vitamin) .... Take 1 tablet by mouth once a day 4)  Omega-3 1000 Mg Caps (Omega-3 fatty acids) .... Take 1 tablet by mouth once a day 5)  Calcium 600 600 Mg Tabs (Calcium carbonate) .... Take 1 tablet by mouth once a day 6)  Synthroid 50 Mcg Tabs (Levothyroxine sodium) .... Take 1 tab every other day (alternating with on the other days) 7)  Robinul-forte 2 Mg Tabs (Glycopyrrolate) .... Take 1 daily for spams and abd discomfort 8)  Benzonatate 100 Mg Caps (Benzonatate) .Marland Kitchen.. 1 or 2 four times a day as needed cough 9)  Nitrogylcerin  .... As needed 10)  Gas-x Extra Strength 125 Mg Caps (Simethicone) .... Take one by mouth before meals as needed 11)  Synthroid 75 Mcg Tabs (Levothyroxine sodium) .... Take 1 tab every other day (alternating with on the other days) 12)  Triamcinolone Acetonide 0.1 % Crea (Triamcinolone acetonide) .... Apply 1-2 times a day 13)  Florastor 250 Mg Caps (Saccharomyces boulardii) .... Take 1 tab twice daily x 30 days. 14)  Bentyl 10 Mg Caps (Dicyclomine hcl) .... Take 1 tab in  the evening 15)  Miralax Powd (Polyethylene glycol 3350) .... Use daily as needed for constipation 16)  Diazepam 5 Mg Tabs (Diazepam) .... Take 1 by mouth once daily as needed  Patient Instructions: 1)  it was good to see you today.  2)  medications reviewed and printed out copy of medications with my name prescribed have been given to you 3)  Please schedule a follow-up appointment in 3-4 months to recheck cholesterol and thyroid labs, sooner if problems.  come in AM fasting for labwork Prescriptions: SYNTHROID 75 MCG TABS (LEVOTHYROXINE SODIUM) take 1 tab every other day (alternating with on the other days)  #30 x 5   Entered and Authorized by:   Newt Lukes MD   Signed by:   Newt Lukes MD on 09/03/2009   Method used:   Print then Give to Patient   RxID:   [id number] SYNTHROID 50 MCG TABS (LEVOTHYROXINE SODIUM) take 1 tab every other day (alternating with on the other days)  #30 x 5   Entered and Authorized by:   Newt Lukes MD   Signed by:   Newt Lukes MD on 09/03/2009   Method used:   Print then Give to Patient   RxID:   [id number] SIMVASTATIN 40 MG TABS (SIMVASTATIN) Take one tablet by mouth daily at bedtime  #30 x 6   Entered and Authorized by:   Newt Lukes MD   Signed by:   Newt Lukes MD on 09/03/2009   Method used:   Print then Give to Patient   RxID:   [id number]

## 2010-06-17 NOTE — Progress Notes (Signed)
Summary: hemoptysis- labs and cxr ordered   Phone Note Call from Patient Call back at Home Phone [phone]   Caller: Patient Call For: young Summary of Call: pt states that she coughed up blood yesterday. 1st time it was a brownish color- second and 3rd times- bright red- about the size of a quarter ea. time. this am she could up "only a speck". please advise. denies fever. no chills- no N or V. says she has been coughing since may/ blood only began yesterday.  Initial call taken by: Tivis Ringer, CNA,  January 21, 2010 10:32 AM  Follow-up for Phone Call        called spoke with patient, who states that she began having hemoptysis yesterday x3, the 1st with brownish mucus and the subsequent 2nd and 3rd times a bright red.  pt states that today she is coughing up some bright red blood mixed w/ green mucus, and wheezing.  pt denies SOB, f/c/s.  [facility] [place]  ALLERGIES: avelox, ethambutol.  last ov w/ CDY 8.2.11 Follow-up by: Boone Master CNA/MA,  January 21, 2010 12:23 PM  Additional Follow-up for Phone Call Additional follow up Details #1::        She has an appointment in a week- keep that unless she gets worse and needs to call sooner. I would like to get a CXR(786.30) now if she can get in, along with CBC(786.30). I have put in the orders.  Please Send Rx for augmentin 875mg , 1 two times a day ,  # 14  Additional Follow-up by: Waymon Budge MD,  January 21, 2010 1:27 PM  New Problems: HEMOPTYSIS UNSPECIFIED (ICD-786.30)   Additional Follow-up for Phone Call Additional follow up Details #2::    Spoke with pt and advised of recs per CDY.  She will come today for labs and cxr and will keep ov for 01/29/10 art 10:15 am.  She will call sooner if needed.  Rx for abx sent to pharm Follow-up by: Vernie Murders,  January 21, 2010 1:54 PM  New Problems: HEMOPTYSIS UNSPECIFIED (ICD-786.30) New/Updated Medications: AUGMENTIN 875-125 MG TABS (AMOXICILLIN-POT  CLAVULANATE) 1 by mouth two times a day until gone Prescriptions: AUGMENTIN 875-125 MG TABS (AMOXICILLIN-POT CLAVULANATE) 1 by mouth two times a day until gone  #14 x 0   Entered by:   Vernie Murders   Authorized by:   Waymon Budge MD   Signed by:   Vernie Murders on 01/21/2010   Method used:   [facility]        [facility]  [place] [address] [facility]       [address]       [place], [place]  [address]       Ph: [phone] or [phone]       Fax: [phone]   RxID:   [id number]

## 2010-06-17 NOTE — Assessment & Plan Note (Signed)
Summary: PERSISTANT COUGH///kp   Copy to:  n/a Primary Provider/Referring Provider:  Newt Lukes MD  CC:  Persistant cough-green phlegm; 2-3 times a day and worse at night..  History of Present Illness: 01/18/09- MAIC bronchitis (dx'd 2004), allergic rhinitis Has felt very well. Not out in heat much,with no recent exacerbations. Has not coughed much at all, not even morning cough .. Benzonatate helped. We gave omnicef then switched to Cipro which did well. CXR- last showed old scarring with suspicious infiltrate in R midlung. That was when she was acurely ill with productive cough. Sputm was Smear NEG for MAIC/AFB and POS for H.flu. PFT- minimal slowing small airways. She exercises on Nordic trac and denies getting dyspneic.  August 02, 2009- MAIC bronchitis hx, allergic rhinitis Over the last month has had increased postnasl drip, starting with an interval of bloody nasal discharge.No significant headache or pressure discomfort. Denies a flu syndrome or current significant cough or wheeze. We reviewed her last CT in 2009 showing bronchiectasis and nodules of MAIC then. CXR 10/1/ showed only RUL scarring..She continues exercising on Nordic track. Denies fever, sweats, nodes.  December 17, 2009- MAIC bronchitis hx, allergic rhinitis At first of May she had rapid onset of a febrile illness that responded nicely to Z pak. Now for several weeks she has noted a cough with green sputum, hoarseness.No fever, but some headache which is unusual. Tessalon helps some. Denies headache, sneeze or discolored nasal discharge.   Preventive Screening-Counseling & Management  Alcohol-Tobacco     Smoking Status: never  Current Medications (verified): 1)  Axid 150 Mg Caps (Nizatidine) .... Take One By Mouth Two Times A Day 2)  Simvastatin 40 Mg Tabs (Simvastatin) .... Take One Tablet By Mouth Daily At Bedtime 3)  Multivitamins   Tabs (Multiple Vitamin) .... Take 1 Tablet By Mouth Once A Day 4)  Omega-3  1000 Mg  Caps (Omega-3 Fatty Acids) .... Take 1 Tablet By Mouth Once A Day 5)  Calcium 600 600 Mg  Tabs (Calcium Carbonate) .... Take 1 Tablet By Mouth Once A Day 6)  Synthroid 50 Mcg Tabs (Levothyroxine Sodium) .... Take 1 Tab Every Other Day (Alternating With On The Other Days) 7)  Robinul-Forte 2 Mg Tabs (Glycopyrrolate) .... Take 1 Daily For Spams and Abd Discomfort 8)  Benzonatate 100 Mg Caps (Benzonatate) .Marland Kitchen.. 1 or 2 Four Times A Day As Needed Cough 9)  Nitrogylcerin .... As Needed 10)  Gas-X Extra Strength 125 Mg Caps (Simethicone) .... Take One By Mouth Before Meals As Needed 11)  Synthroid 75 Mcg Tabs (Levothyroxine Sodium) .... Take 1 Tab Every Other Day (Alternating With On The Other Days) 12)  Triamcinolone Acetonide 0.1 % Crea (Triamcinolone Acetonide) .... Apply 1-2 Times A Day 13)  Bentyl 10 Mg Caps (Dicyclomine Hcl) .... Take 1 Tab in The Evening 14)  Diazepam 5 Mg Tabs (Diazepam) .... Take 1 By Mouth Once Daily As Needed  Allergies (verified): 1)  ! Avelox 2)  ! Ethambutol Hcl (Ethambutol Hcl)  Past History:  Past Medical History: Last updated: 09/03/2009 Pneumonia 2004, 2009 - pneumovax done GERD colon polyps, 1999 /NORMAL COLON 2007 BRONCHIECTASIS- M.avium 2004, hemoptysis 2009 hypothyroidism Allergic Rhinitis HYPERLIPIDEMIA  MD rooster: [facility]  Past Surgical History: Last updated: 08/15/2008 Thyroidectomy for Hurthle Cell tumor Hysterectomy S/P BLADDER SUSPENSION Carpal Tunnel Release  Family History: Last updated: 03/14/2009 Mother - cervical cancer age 48 No FH of Colon Cancer: father died of a heart attack  at age 36  Social History: Last updated: 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  Alcohol Use - no Daily Caffeine Use Illicit Drug Use - no  Risk Factors: Smoking Status: never (12/17/2009)  Review of Systems      See HPI       The patient complains of productive cough,  non-productive cough, and nasal congestion/difficulty breathing through nose.  The patient denies shortness of breath with activity, shortness of breath at rest, coughing up blood, chest pain, irregular heartbeats, acid heartburn, indigestion, loss of appetite, weight change, abdominal pain, difficulty swallowing, sore throat, tooth/dental problems, headaches, and sneezing.    Vital Signs:  Patient profile:   70 year old female Height:      63 inches Weight:      117.38 pounds BMI:     20.87 O2 Sat:      94 % on Room air Pulse rate:   81 / minute BP sitting:   100 / 62  (left arm) Cuff size:   regular  Vitals Entered By: Reynaldo Minium CMA (December 17, 2009 2:13 PM)  O2 Flow:  Room air CC: Persistant cough-green phlegm; 2-3 times a day and worse at night.   Physical Exam  Additional Exam:  General: A/Ox3; pleasant and cooperative, NAD, slender, comfortable appearing SKIN: no rash, lesions NODES: no lymphadenopathy HEENT: Herreid/AT, EOM- WNL, Conjuctivae- clear, PERRLA, TM-WNL, Nose- clear, Throat- clear and wnl, no erythema or drainage, Mallampati  II NECK: Supple w/ fair ROM, JVD- none, normal carotid impulses w/o bruits Thyroid- , no stridor. CHEST:Very slightly coarse breath sounds, recurrent dry cough HEART: RRR, no m/g/r heard ABDOMEN: Soft  ZOX:WRUE, nl pulses, no edema  NEURO: Grossly intact to observation      Impression & Recommendations:  Problem # 1:  ALLERGIC RHINITIS (ICD-477.9) She is having a bronchits, but not describing much upper repiratory discomfort now. Watch for change as we move into Fall.  Problem # 2:  BRONCHIECTASIS (ICD-494.0) Exacerbation of bronchiectasis/ bronchitis. We will cover suspected bacterial component this time with light dose biaxin. There has not been evidence of recurrent MAIC since she was treated in 2004.  Medications Added to Medication List This Visit: 1)  Clarithromycin 500 Mg Tabs (Clarithromycin) .Marland Kitchen.. 1 two times a day after  meals  Other Orders: Prescription Created Electronically [phone]) Est. Patient Level IV (81191)  Patient Instructions: 1)  Please schedule a follow-up appointment in 4 months. 2)  Scripts for generic biaxin and for benzonatate sent to your drug store Prescriptions: BENZONATATE 100 MG CAPS (BENZONATATE) 1 or 2 four times a day as needed cough  #30 x prn   Entered and Authorized by:   Waymon Budge MD   Signed by:   Waymon Budge MD on 12/17/2009   Method used:   [facility]  [place] [address] [facility]       [address]       [place], [place]  [address]       Ph: [phone] or [phone]       Fax: [phone]   RxID:   [id number] CLARITHROMYCIN 500 MG TABS (CLARITHROMYCIN) 1 two times a day after meals  #20 x 0   Entered and Authorized by:   Waymon Budge MD   Signed by:   Waymon Budge MD on 12/17/2009   Method used:   [facility]        [facility]  [place] [address] (retail)       [address]       [place], [place]  [address]       Ph: [phone] or [phone]       Fax: [phone]   RxID:   [id number]

## 2010-06-17 NOTE — Assessment & Plan Note (Signed)
Summary: rov 6 months///kp   Copy to:  n/a Primary Provider/Referring Provider:  Newt Lukes MD  CC:  6 month follow up  and finished augmentin RX on 01/26/10 for blood tinged sputum.  History of Present Illness:  August 02, 2009- MAIC bronchitis hx, allergic rhinitis Over the last month has had increased postnasl drip, starting with an interval of bloody nasal discharge.No significant headache or pressure discomfort. Denies a flu syndrome or current significant cough or wheeze. We reviewed her last CT in 2009 showing bronchiectasis and nodules of MAIC then. CXR 10/1/ showed only RUL scarring..She continues exercising on Nordic track. Denies fever, sweats, nodes.  December 17, 2009- MAIC bronchitis hx, allergic rhinitis At first of May she had rapid onset of a febrile illness that responded nicely to Z pak. Now for several weeks she has noted a cough with green sputum, hoarseness.No fever, but some headache which is unusual. Tessalon helps some. Denies headache, sneeze or discolored nasal discharge.  February 03, 2010- Hx MAIC bronchitis, bronchiectasis, allergic rhinitis We gave biaxin in August but she stopped that because of GI upset. Then she called with some hemoptysis September 1. CXR showed patch infiltrates. We sent augmentin on Sept 6 when WBC was 9500. Chest now feels fine with no more blood. Denies night sweat or fever and feeling well. She had been having some recent headache that she treated successfully with zyrtec.      Preventive Screening-Counseling & Management  Alcohol-Tobacco     Smoking Status: never  Current Medications (verified): 1)  Axid 150 Mg Caps (Nizatidine) .... Take One By Mouth Two Times A Day 2)  Simvastatin 40 Mg Tabs (Simvastatin) .... Take One Tablet By Mouth Daily At Bedtime 3)  Multivitamins   Tabs (Multiple Vitamin) .... Take 1 Tablet By Mouth Once A Day 4)  Omega-3 1000 Mg  Caps (Omega-3 Fatty Acids) .... Take 1 Tablet By Mouth Once A  Day 5)  Calcium 600 600 Mg  Tabs (Calcium Carbonate) .... Take 1 Tablet By Mouth Once A Day 6)  Benzonatate 100 Mg Caps (Benzonatate) .Marland Kitchen.. 1 or 2 Four Times A Day As Needed Cough 7)  Nitrogylcerin .... As Needed 8)  Gas-X Extra Strength 125 Mg Caps (Simethicone) .... Take One By Mouth Before Meals As Needed 9)  Synthroid 50 Mcg Tabs (Levothyroxine Sodium) .... Take 1 Tab Every Other Day (Alternating With On The Other Days) 10)  Synthroid 75 Mcg Tabs (Levothyroxine Sodium) .... Take 1 Tab Every Other Day (Alternating With On The Other Days) 11)  Triamcinolone Acetonide 0.1 % Crea (Triamcinolone Acetonide) .... Apply 1-2 Times A Day 12)  Diazepam 5 Mg Tabs (Diazepam) .... Take 1 By Mouth Once Daily As Needed 13)  Fluconazole 100 Mg Tabs (Fluconazole) .Marland Kitchen.. 1 By Mouth Once Daily As Needed For Yeast Symptoms  Allergies (verified): 1)  ! Avelox 2)  ! Ethambutol Hcl (Ethambutol Hcl)  Past History:  Past Medical History: Last updated: 01/14/2010 Pneumonia 2004, 2009 - pneumovax done GERD colon polyps, 1999 /NORMAL COLON 2007 BRONCHIECTASIS- M.avium 2004, hemoptysis 2009 hypothyroid Allergic Rhinitis HYPERLIPIDEMIA  MD roster: pulm-[facility] cards-brodie  Past Surgical History: Last updated: 08/15/2008 Thyroidectomy for Hurthle Cell tumor Hysterectomy S/P BLADDER SUSPENSION Carpal Tunnel Release  Family History: Last updated: 03/14/2009 Mother - cervical cancer age 85 No FH of Colon Cancer: father died of a heart attack at age 86  Social History: Last updated: 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  Alcohol Use -  no Daily Caffeine Use Illicit Drug Use - no  Risk Factors: Smoking Status: never (02/03/2010)  Review of Systems      See HPI       The patient complains of headaches.  The patient denies shortness of breath with activity, shortness of breath at rest, productive cough, non-productive cough, coughing up blood, chest  pain, irregular heartbeats, acid heartburn, indigestion, loss of appetite, weight change, abdominal pain, difficulty swallowing, sore throat, tooth/dental problems, nasal congestion/difficulty breathing through nose, and sneezing.    Vital Signs:  Patient profile:   70 year old female Height:      63 inches Weight:      120 pounds BMI:     21.33 O2 Sat:      95 % on Room air Pulse rate:   77 / minute BP sitting:   110 / 78  (left arm)  Vitals Entered By: Renold Genta RCP, LPN (February 03, 2010 3:41 PM)  O2 Flow:  Room air CC: 6 month follow up , finished augmentin RX on 01/26/10 for blood tinged sputum Comments Medications reviewed with patient Renold Genta RCP, LPN  February 03, 2010 3:43 PM    Physical Exam  Additional Exam:  General: A/Ox3; pleasant and cooperative, NAD, slender, comfortable appearing SKIN: no rash, lesions NODES: no lymphadenopathy HEENT: [place]/AT, EOM- WNL, Conjuctivae- clear, PERRLA, TM-WNL, Nose- clear, Throat- clear and wnl, no erythema or drainage, Mallampati  II NECK: Supple w/ fair ROM, JVD- none, normal carotid impulses w/o bruits Thyroid- , no stridor. CHEST:clear to P&A HEART: RRR, no m/g/r heard ABDOMEN: Soft  ZOX:WRUE, nl pulses, no edema  NEURO: Grossly intact to observation      CXR  Procedure date:  9/  Findings:      DG CHEST 2 VIEW - [id number]   Clinical Data: History of coughing, chest pain, and hemoptysis.   CHEST - 2 VIEW   Comparison: 03/01/2009 12/27/2008.  CT 01/26/2008.   Findings: Patchy infiltrative densities are seen within the right middle lobe and lingula.  They were not evident on the previous study of 03/01/2009 but similar opacities are seen on previous study of 12/27/2008. On previous CT bronchiectatic and nodular changes were seen in the right middle lobe and in the lingula.   No pleural effusion is seen.  There is chronic apical pleural thickening right greater than left.  Cardiac density is  normal size.  There is an overall mild hyperinflation configuration. There is osteopenic appearance of the bones.  Changes of degenerative disc disease and degenerative spondylosis are seen.   IMPRESSION: Patchy infiltrative densities are present within the right middle lobe and lingula.  They had cleared on the study of 03/01/2009 but the current appearance is similar to that of the study of 12/27/2008.   Previous CT has demonstrated bronchiectasis and infiltrates in this area.  There is certainly atelectasis on the current study in the areas of the lingula and right middle lobe with infiltrate but infection cannot be excluded.  CT of the chest would better characterize the pulmonary findings and could be performed if clinically indicated.   Read By:  Crawford Givens,  M.D.   Impression & Recommendations:  Problem # 1:  HEMOPTYSIS UNSPECIFIED (ICD-786.30)  Hemoptysis was associated with infiltrates- possibly blood. This resolved with augmentin, indicating a probable hemorrhagic bronchits. We are repeating a CXR to f/u now on the infiltrates seen before. She has had previous hemoptysis due to her bronchiectasis. The following medications were removed from the  medication list:    Augmentin 875-125 Mg Tabs (Amoxicillin-pot clavulanate) .Marland Kitchen... 1 by mouth two times a day until gone Her updated medication list for this problem includes:    Multivitamins Tabs (Multiple vitamin) .Marland Kitchen... Take 1 tablet by mouth once a day    Calcium 600 600 Mg Tabs (Calcium carbonate) .Marland Kitchen... Take 1 tablet by mouth once a day  Problem # 2:  ALLERGIC RHINITIS (ICD-477.9)  Discussed options and I will suggest a nonsedating antihistamine.  Other Orders: Est. Patient Level IV (65784) T-2 View CXR (71020TC) Flu Vaccine 37yrs + MEDICARE PATIENTS (O9629) Administration Flu vaccine - MCR (B2841)  Patient Instructions: 1)  Please schedule a follow-up appointment in 4 months. 2)  A chest x-ray has been recommended.   Your imaging study may require preauthorization.  3)  Flu vax 4)  Consider a less sedating antihistamine like allegra or claritin if needed for allergy   CXR  Procedure date:  9/  Findings:      DG CHEST 2 VIEW - [id number]   Clinical Data: History of coughing, chest pain, and hemoptysis.   CHEST - 2 VIEW   Comparison: 03/01/2009 12/27/2008.  CT 01/26/2008.   Findings: Patchy infiltrative densities are seen within the right middle lobe and lingula.  They were not evident on the previous study of 03/01/2009 but similar opacities are seen on previous study of 12/27/2008. On previous CT bronchiectatic and nodular changes were seen in the right middle lobe and in the lingula.   No pleural effusion is seen.  There is chronic apical pleural thickening right greater than left.  Cardiac density is normal size.  There is an overall mild hyperinflation configuration. There is osteopenic appearance of the bones.  Changes of degenerative disc disease and degenerative spondylosis are seen.   IMPRESSION: Patchy infiltrative densities are present within the right middle lobe and lingula.  They had cleared on the study of 03/01/2009 but the current appearance is similar to that of the study of 12/27/2008.   Previous CT has demonstrated bronchiectasis and infiltrates in this area.  There is certainly atelectasis on the current study in the areas of the lingula and right middle lobe with infiltrate but infection cannot be excluded.  CT of the chest would better characterize the pulmonary findings and could be performed if clinically indicated.   Read By:  Crawford Givens,  M.D.    Flu Vaccine Consent Questions     Do you have a history of severe allergic reactions to this vaccine? no    Any prior history of allergic reactions to egg and/or gelatin? no    Do you have a sensitivity to the preservative Thimersol? no    Do you have a past history of Guillan-Barre Syndrome? no    Do you  currently have an acute febrile illness? no    Have you ever had a severe reaction to latex? no    Vaccine information given and explained to patient? yes    Are you currently pregnant? no    Lot Number:[id number]   Exp Date:11/15/2010   Site Given  Left Deltoid IMedflu  Boone Master CNA/MA  February 03, 2010 5:19 PM

## 2010-06-17 NOTE — Assessment & Plan Note (Signed)
Summary: FOLLOW UP/YF    History of Present Illness Visit Type: Follow-up Visit Primary GI MD: Melvia Heaps MD Primary Provider: Newt Lukes MD Requesting Provider: n/a Chief Complaint: Cramping and burning in lower abdomen mostly at night History of Present Illness:   Ms. Hassinger has returned for followup of her abdominal complaints.  At this point she is complaining of mild crampy lower abdominal pain which occurs especially meetings.  She moves her bowels every 2-3 days.  She takes omeprazole on a daily and occasionally has upper chest burning.  This is relieved with antacids.   GI Review of Systems    Reports abdominal pain, acid reflux, belching, bloating, and  dysphagia with solids.     Location of  Abdominal pain: lower abdomen.    Denies chest pain, dysphagia with liquids, heartburn, loss of appetite, nausea, vomiting, vomiting blood, weight loss, and  weight gain.      Reports change in bowel habits and  constipation.     Denies anal fissure, black tarry stools, diarrhea, diverticulosis, fecal incontinence, heme positive stool, hemorrhoids, irritable bowel syndrome, jaundice, light color stool, liver problems, rectal bleeding, and  rectal pain.    Current Medications (verified): 1)  Axid 150 Mg Caps (Nizatidine) .... Take One By Mouth Two Times A Day 2)  Simvastatin 40 Mg Tabs (Simvastatin) .... Take One Tablet By Mouth Daily At Bedtime 3)  Multivitamins   Tabs (Multiple Vitamin) .... Take 1 Tablet By Mouth Once A Day 4)  Omega-3 1000 Mg  Caps (Omega-3 Fatty Acids) .... Take 1 Tablet By Mouth Once A Day 5)  Calcium 600 600 Mg  Tabs (Calcium Carbonate) .... Take 1 Tablet By Mouth Once A Day 6)  Valium 5 Mg  Tabs (Diazepam) .... Take 1/2  Tablet By Mouth Once  Every 6 Hours As Needed 7)  Synthroid 50 Mcg Tabs (Levothyroxine Sodium) .... Take 1 Tab Every Other Day (Alternating With On The Other Days) 8)  Robinul-Forte 2 Mg Tabs (Glycopyrrolate) .... Take 1 Daily For  Spams and Abd Discomfort 9)  Benzonatate 100 Mg Caps (Benzonatate) .Marland Kitchen.. 1 or 2 Four Times A Day As Needed Cough 10)  Omeprazole 20 Mg Cpdr (Omeprazole) .Marland Kitchen.. 1 Tab Once Daily 11)  Align  Caps (Probiotic Product) .Marland Kitchen.. 1 By Mouth Once Daily 12)  Nitrogylcerin .... As Needed 13)  Gas-X Extra Strength 125 Mg Caps (Simethicone) .... Take One By Mouth Before Meals As Needed 14)  Synthroid 75 Mcg Tabs (Levothyroxine Sodium) .... Take 1 Tab Every Other Day (Alternating With On The Other Days) 15)  Triamcinolone Acetonide 0.1 % Crea (Triamcinolone Acetonide) .... Apply 1-2 Times A Day  Allergies (verified): 1)  ! Avelox 2)  ! Ethambutol Hcl (Ethambutol Hcl)  Past History:  Past Medical History: Reviewed history from 03/27/2009 and no changes required. Pneumonia 2004, 2009 - pneumovax + GERD colon polyps, 1999 /NORMAL COLON 2007 BRONCHIECTASIS- M.avium 2004, hemoptysis 2009 hypothyroidism Allergic Rhinitis HYPERLIPIDEMIA  MD rooster: pulm-[facility] cards-brodie  Past Surgical History: Reviewed history from 08/15/2008 and no changes required. Thyroidectomy for Hurthle Cell tumor Hysterectomy S/P BLADDER SUSPENSION Carpal Tunnel Release  Family History: Reviewed history from 03/14/2009 and no changes required. Mother - cervical cancer age 45 No FH of Colon Cancer: father died of a heart attack at age 66  Social History: Reviewed history from 07/24/2008 and no changes required. 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  Alcohol Use - no Daily Caffeine Use Illicit Drug Use -  no  Review of Systems       The patient complains of allergy/sinus, arthritis/joint pain, back pain, cough, fatigue, sleeping problems, and urination - excessive.  The patient denies anxiety-new, blood in urine, breast changes/lumps, change in vision, confusion, coughing up blood, depression-new, fainting, fever, headaches-new, hearing problems, heart murmur, heart rhythm changes,  itching, menstrual pain, muscle pains/cramps, night sweats, nosebleeds, pregnancy symptoms, shortness of breath, skin rash, sore throat, swelling of feet/legs, swollen lymph glands, thirst - excessive , urination - excessive , urination changes/pain, urine leakage, vision changes, and voice change.    Vital Signs:  Patient profile:   70 year old female Height:      63 inches Weight:      121.13 pounds BMI:     21.53 Pulse rate:   80 / minute Pulse rhythm:   regular BP sitting:   98 / 60  (left arm) Cuff size:   regular  Vitals Entered By: June McMurray CMA Duncan Dull) (July 11, 2009 11:51 AM)   Impression & Recommendations:  Problem # 1:  ABDOMINAL PAIN, GENERALIZED (ICD-789.07) Pain is nonspecific.  Recommendations #1 I instructed the patient to take hyoscyamine in the evenings and to be more aggressive and using this medicine she has discomfort  Problem # 2:  GERD (ICD-530.81) Plan to continue omeprazole

## 2010-06-17 NOTE — Progress Notes (Signed)
Summary: yeast infection  Phone Note Call from Patient   Caller: Patient Call For: young Summary of Call: pt have yeast infection from antibiotic Initial call taken by: Rickard Patience,  December 30, 2009 3:32 PM  Follow-up for Phone Call        Called, spoke with pt.  Pt states she was on abx lsat week but it was d/c'd d/t side effects.  Now c/o yeast infection.  Using cream with little relief.  [facility].  Dr. Maple Hudson is off this week--Dr. Vassie Loll "doc of the day" pls advise.  Thanks! Follow-up by: Gweneth Dimitri RN,  December 30, 2009 3:43 PM  Additional Follow-up for Phone Call Additional follow up Details #1::        diflucan 100 mg by mouth x 1 Additional Follow-up by: Comer Locket. Vassie Loll MD,  December 30, 2009 4:12 PM    Additional Follow-up for Phone Call Additional follow up Details #2::    Called, spoke with pt.  Pt informed to take diflucan po x 1 per RA.  Aware diflucan rx sent to [facility].  She verbalized understanding of instructions.  Gweneth Dimitri RN  December 30, 2009 4:17 PM   New/Updated Medications: DIFLUCAN 100 MG TABS (FLUCONAZOLE) take one tablet by mouth x1 dose Prescriptions: DIFLUCAN 100 MG TABS (FLUCONAZOLE) take one tablet by mouth x1 dose  #1 x 0   Entered by:   Gweneth Dimitri RN   Authorized by:   Comer Locket. Vassie Loll MD   Signed by:   Gweneth Dimitri RN on 12/30/2009   Method used:   [facility]        [facility]  [place] [address] [facility]ail[facility]       [address]       [place], [place]  [address]       Ph: [phone] or [phone]       Fax: [phone]   RxID:   [id number]

## 2010-06-17 NOTE — Assessment & Plan Note (Signed)
Summary: 6 months/apc   Copy to:  n/a Primary Provider/Referring Provider:  Newt Lukes MD  CC:  6 month follow up visit-Slight SOB/wheezing past week.Marilyn Blake  History of Present Illness: Oct 04, 2008-Presents for an acute office visit. Complains of prod cough with green phlegm, nausea with vomiting x1 with green mucus, sore throat, weakness. OTC not helping. Denies chest pain,  orthopnea, hemoptysis, fever, n/v/d, edema, headache,recent travel Feels low energy, decreaed appetiite.   12/27/08- MAIC bronchiectasis ( dx'd 2004) Still a lot of cough, after meals and in church. She took [place] in May. Sputum most days is discolored, but not bloody. Not toxic now, but cough is frustrating. Denies dyspnea doing exercycle daily. Denies head congestion and doesn't notice active reflux.  01/18/09- MAIC bron chitis (dx'd 2004), allergic rhinitis Has felt very well. Not out in heat much,with no recent exacerbations. Has not coughed much at all, not even morning cough .. Benzonatate helped. We gave omnicef then switched to Cipro which did well. CXR- last showed old scarring with suspicious infiltrate in R midlung. That was when she was acurely ill with productive cough. Sputm was Smear NEG for MAIC/AFB and POS for H.flu. PFT- minimal slowing small airways. She exercises on Nordic trac and denies getting dyspneic.  August 02, 2009- MAIC bronchitis hx, allergic rhinitis Over the last month has had increased postnasl drip, starting with an interval of bloody nasal discharge.No significant headache or pressure discomfort. Denies a flu syndrome or current significant cough or wheeze. We reviewed her last CT in 2009 showing bronchiectasis and nodules of MAIC then. CXR 10/1/ showed only RUL scarring..She continues exercising on Nordic track. Denies fever, sweats, nodes.   Current Medications (verified): 1)  Axid 150 Mg Caps (Nizatidine) .... Take One By Mouth Two Times A Day 2)  Simvastatin 40 Mg Tabs  (Simvastatin) .... Take One Tablet By Mouth Daily At Bedtime 3)  Multivitamins   Tabs (Multiple Vitamin) .... Take 1 Tablet By Mouth Once A Day 4)  Omega-3 1000 Mg  Caps (Omega-3 Fatty Acids) .... Take 1 Tablet By Mouth Once A Day 5)  Calcium 600 600 Mg  Tabs (Calcium Carbonate) .... Take 1 Tablet By Mouth Once A Day 6)  Valium 5 Mg  Tabs (Diazepam) .... Take 1/2  Tablet By Mouth Once  Every 6 Hours As Needed 7)  Synthroid 50 Mcg Tabs (Levothyroxine Sodium) .... Take 1 Tab Every Other Day (Alternating With On The Other Days) 8)  Robinul-Forte 2 Mg Tabs (Glycopyrrolate) .... Take 1 Daily For Spams and Abd Discomfort 9)  Benzonatate 100 Mg Caps (Benzonatate) .Marilyn Blake.. 1 or 2 Four Times A Day As Needed Cough 10)  Phillips Colon Health  Caps (Probiotic Product) .... Take 1 By Mouth Once Daily 11)  Nitrogylcerin .... As Needed 12)  Gas-X Extra Strength 125 Mg Caps (Simethicone) .... Take One By Mouth Before Meals As Needed 13)  Synthroid 75 Mcg Tabs (Levothyroxine Sodium) .... Take 1 Tab Every Other Day (Alternating With On The Other Days) 14)  Triamcinolone Acetonide 0.1 % Crea (Triamcinolone Acetonide) .... Apply 1-2 Times A Day  Allergies (verified): 1)  ! Avelox 2)  ! Ethambutol Hcl (Ethambutol Hcl)  Past History:  Past Medical History: Last updated: 03/27/2009 Pneumonia 2004, 2009 - pneumovax + GERD colon polyps, 1999 /NORMAL COLON 2007 BRONCHIECTASIS- M.avium 2004, hemoptysis 2009 hypothyroidism Allergic Rhinitis HYPERLIPIDEMIA  MD rooster: pulm-[facility] cards-[facility]die  Past Surgical History: Last updated: 08/15/2008 Thyroidectomy for Hurthle Cell tumor Hysterectomy  S/P BLADDER SUSPENSION Carpal Tunnel Release  Family History: Last updated: 03/14/2009 Mother - cervical cancer age 54 No FH of Colon Cancer: father died of a heart attack at age 60  Social History: Last updated: 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  Alcohol Use  - no Daily Caffeine Use Illicit Drug Use - no  Risk Factors: Smoking Status: never (07/24/2008)  Review of Systems      See HPI  The patient denies anorexia, fever, weight loss, weight gain, vision loss, decreased hearing, hoarseness, chest pain, syncope, dyspnea on exertion, peripheral edema, prolonged cough, headaches, hemoptysis, and severe indigestion/heartburn.    Vital Signs:  Patient profile:   70 year old female Height:      63 inches Weight:      122.13 pounds BMI:     21.71 O2 Sat:      97 % on Room air Pulse rate:   80 / minute BP sitting:   106 / 62  (right arm) Cuff size:   regular  Vitals Entered By: Reynaldo Minium CMA (August 02, 2009 10:51 AM)  O2 Flow:  Room air  Physical Exam  Additional Exam:  General: A/Ox3; pleasant and cooperative, NAD, slender, comfortable appearing SKIN: no rash, lesions NODES: no lymphadenopathy HEENT: [place]/AT, EOM- WNL, Conjuctivae- clear, PERRLA, TM-WNL, Nose- clear, Throat- clear and wnl, no erythema or drainage NECK: Supple w/ fair ROM, JVD- none, normal carotid impulses w/o bruits Thyroid- , no stridor. CHEST:Very slightly coarse breath sounds,  but without cough, rhonchi or wheeze HEART: RRR, no m/g/r heard ABDOMEN: Soft  ZOX:WRUE, nl pulses, no edema  NEURO: Grossly intact to observation      Impression & Recommendations:  Problem # 1:  BRONCHIECTASIS (ICD-494.0) Radiology has suggested MAIC. She is very asymptomatic. we will follow conservatively.  Problem # 2:  ALLERGIC RHINITIS (ICD-477.9)  Possible rhinosinusitis now lingering after a sinusitis this winter. She has had some GI upset but is willing to try amoxacillin.  Medications Added to Medication List This Visit: 1)  Phillips Colon Health Caps (Probiotic product) .... Take 1 by mouth once daily 2)  Amoxicillin 500 Mg Caps (Amoxicillin) .Marilyn Blake.. 1 three times a day x 7 days  Other Orders: Est. Patient Level III ([address])  Patient Instructions: 1)  Please  schedule a follow-up appointment in 6 months. 2)  try amoxacillin to help clear possible sinusitis. Script sent to your drug store. 3)  Continue nasal rinsing with saline while you are having trouble. Prescriptions: AMOXICILLIN 500 MG CAPS (AMOXICILLIN) 1 three times a day x 7 days  #21 x 0   Entered and Authorized by:   Waymon Budge MD   Signed by:   Waymon Budge MD on 08/02/2009   Method used:   [facility]  [place] [address]       [address]       [place], [place]  [address]       Ph: [phone] or [phone]       Fax: [phone]   RxID:   [id number]

## 2010-06-17 NOTE — Assessment & Plan Note (Signed)
Summary: F/U Cramping, burning ABD discomfort    History of Present Illness Visit Type: Follow-up Visit Primary GI MD: Melvia Heaps MD Primary Provider: Newt Lukes MD Requesting Provider: n/a Chief Complaint: burning & cramping in abdomen History of Present Illness:   70 YO FEMALE KNOWN TO DR. KAPLAN WITH HX OF IBS,GERD, BRONCHIECTASIS,,COLON POLYPS. SHE COMES IN WITH C/O  NIGHTIME CRAMPING,LOWER ABDOMINAL BURNING,BLOATING. HAS FELT BAD FOR THE PAST 3 DAYS, UNABLE TO SLEEP DUE TO DISCOMFORT.ALWAYS FEELS CONSTIPATED. SHE HAS BEEN ON ROBINUL,GAS X ,PEPTO AND ACTIVIA. NO FEVER, CHILLS, APPETITE OK,NO NAUSEA.SAYS ALL OF HER GI SXS AFTER TAKING PROLONGED COURSE OF ABX ABOUT 2 YEARS AGO-WAS ON X 14 MONTHS FOR PULMONARY PROBLEM. CUURENTLY ON AMOXICILLIN,FOR BRONCHITIS.   GI Review of Systems    Reports abdominal pain, acid reflux, and  bloating.     Location of  Abdominal pain: epigastric area.    Denies belching, chest pain, dysphagia with liquids, dysphagia with solids, heartburn, loss of appetite, nausea, vomiting, vomiting blood, weight loss, and  weight gain.      Reports constipation.     Denies anal fissure, black tarry stools, change in bowel habit, diarrhea, diverticulosis, fecal incontinence, heme positive stool, hemorrhoids, irritable bowel syndrome, jaundice, light color stool, liver problems, rectal bleeding, and  rectal pain.    Current Medications (verified): 1)  Axid 150 Mg Caps (Nizatidine) .... Take One By Mouth Two Times A Day 2)  Simvastatin 40 Mg Tabs (Simvastatin) .... Take One Tablet By Mouth Daily At Bedtime 3)  Multivitamins   Tabs (Multiple Vitamin) .... Take 1 Tablet By Mouth Once A Day 4)  Omega-3 1000 Mg  Caps (Omega-3 Fatty Acids) .... Take 1 Tablet By Mouth Once A Day 5)  Calcium 600 600 Mg  Tabs (Calcium Carbonate) .... Take 1 Tablet By Mouth Once A Day 6)  Valium 5 Mg  Tabs (Diazepam) .... Take 1/2  Tablet By Mouth Once  Every 6 Hours As Needed 7)   Synthroid 50 Mcg Tabs (Levothyroxine Sodium) .... Take 1 Tab Every Other Day (Alternating With On The Other Days) 8)  Robinul-Forte 2 Mg Tabs (Glycopyrrolate) .... Take 1 Daily For Spams and Abd Discomfort 9)  Benzonatate 100 Mg Caps (Benzonatate) .Marland Kitchen.. 1 or 2 Four Times A Day As Needed Cough 10)  Phillips Colon Health  Caps (Probiotic Product) .... Take 1 By Mouth Once Daily 11)  Nitrogylcerin .... As Needed 12)  Gas-X Extra Strength 125 Mg Caps (Simethicone) .... Take One By Mouth Before Meals As Needed 13)  Synthroid 75 Mcg Tabs (Levothyroxine Sodium) .... Take 1 Tab Every Other Day (Alternating With On The Other Days) 14)  Triamcinolone Acetonide 0.1 % Crea (Triamcinolone Acetonide) .... Apply 1-2 Times A Day 15)  Amoxicillin 500 Mg Caps (Amoxicillin) .Marland Kitchen.. 1 Three Times A Day X 7 Days  Allergies (verified): 1)  ! Avelox 2)  ! Ethambutol Hcl (Ethambutol Hcl)  Past History:  Past Medical History: Reviewed history from 03/27/2009 and no changes required. Pneumonia 2004, 2009 - pneumovax + GERD colon polyps, 1999 /NORMAL COLON 2007 BRONCHIECTASIS- M.avium 2004, hemoptysis 2009 hypothyroidism Allergic Rhinitis HYPERLIPIDEMIA  MD rooster: [facility]  Past Surgical History: Reviewed history from 08/15/2008 and no changes required. Thyroidectomy for Hurthle Cell tumor Hysterectomy S/P BLADDER SUSPENSION Carpal Tunnel Release  Family History: Reviewed history from 03/14/2009 and no changes required. Mother - cervical cancer age 7 No FH of Colon Cancer: father died of a heart attack at age  38  Social History: Reviewed history from 07/24/2008 and no changes required. 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  Alcohol Use - no Daily Caffeine Use Illicit Drug Use - no  Review of Systems       The patient complains of allergy/sinus, cough, fatigue, sleeping problems, and sore throat.  The patient denies anemia, anxiety-new,  arthritis/joint pain, back pain, blood in urine, breast changes/lumps, change in vision, confusion, coughing up blood, depression-new, fainting, fever, headaches-new, hearing problems, heart murmur, heart rhythm changes, itching, menstrual pain, muscle pains/cramps, night sweats, nosebleeds, pregnancy symptoms, shortness of breath, skin rash, swelling of feet/legs, swollen lymph glands, thirst - excessive , urination - excessive , urination changes/pain, urine leakage, vision changes, and voice change.         OTHERWISE AS IN HPI  Vital Signs:  Patient profile:   70 year old female Height:      63 inches Weight:      120.38 pounds BMI:     21.40 Pulse rate:   84 / minute Pulse rhythm:   regular BP sitting:   106 / 62  (left arm) Cuff size:   regular  Vitals Entered By: June McMurray CMA Duncan Dull) (August 07, 2009 11:17 AM)  Physical Exam  General:  Well developed, well nourished, no acute distress. Head:  Normocephalic and atraumatic. Eyes:  PERRLA, no icterus. Lungs:  Clear throughout to auscultation. Heart:  Regular rate and rhythm; no murmurs, rubs,  or bruits. Abdomen:  SOFT, NO FOCAL TENDERNESS, NO MASS OR HSM,BS+ Rectal:  NOT DONE Extremities:  No clubbing, cyanosis, edema or deformities noted. Neurologic:  Alert and  oriented x4;  grossly normal neurologically. Psych:  Alert and cooperative. Normal mood and affect.anxious.     Impression & Recommendations:  Problem # 1:  IRRITABLE BOWEL SYNDROME (ICD-564.1) Assessment Deteriorated 70 YO FEMALE WITH IBS WITH EXACERBATION OF SXS ON ANTIBIOTICS FOR BRONCHITIS  CONTINUE ROBINUL FORTE TWICE DAILY-MAY SWITCH TO BENTYL 10 MG 2-3 X DAILY WHEN RX RUNS OUT AS MAY BEE LESS EXPENSIVE ADD FLORASTOR TWICE DAILY X ONE MONTH ADVISED MIRALAX,4 DOSES TO PURGE BOWEL THEN AS NEEDED DAILY FOLLOW UP SR KAPLAN AS NEEDED.  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  2)  We have given you samples of FLorastor and Miralax. 3)  We want you to use Miralax 4-5 doses in several hours to purge the colon. You can put it in water or Gatorade. 4)  Then use Miralax 1 dose daily in water. 5)  Follow up with Dr. Arlyce Dice as needed.  6)  Please call us with a progress report. 7)  Copy sent to : Otho Najjar, MD Prescriptions: BENTYL 10 MG CAPS (DICYCLOMINE HCL) Take 1 tab in the evening  #30 x 4   Entered by:   Lowry Ram NCMA   Authorized by:   Sammuel Cooper PA-c   Signed by:   Lowry Ram NCMA on 08/07/2009   Method used:   [facility]        [facility] [address]       [address]       [place], [place]  [address]       Ph: [phone] or [phone]  Fax: [phone]   RxID:   [id number] FLORASTOR 250 MG CAPS (SACCHAROMYCES BOULARDII) Take 1 tab twice daily x 30 days.  #60 x 1   Entered by:   Lowry Ram NCMA   Authorized by:   Sammuel Cooper PA-c   Signed by:   Lowry Ram NCMA on 08/07/2009   Method used:   [facility]        [facility] [address] [facility]ail[facility]       [address]       [place], [place]  [address]       Ph: [phone] or [phone]       Fax: [phone]   RxID:   [id number]

## 2010-06-19 NOTE — Progress Notes (Signed)
Summary: [facility]  Phone Note From Pharmacy   Caller: [facility] [phone] Reason for Call: Needs renewal Summary of Call: Md recieved [facility] need refills on levothyroxine 50mg  & 75mg , also simvastatin. Md sign ok # 90 with 3 addtional refills. Faxed back to [facility] [phone]. Updated EMR Initial call taken by: Orlan Leavens RMA,  May 21, 2010 9:31 AM    Prescriptions: SIMVASTATIN 40 MG TABS (SIMVASTATIN) Take one tablet by mouth daily at bedtime  #90 x 3   Entered by:   Orlan Leavens RMA   Authorized by:   Newt Lukes MD   Signed by:   Orlan Leavens RMA on 05/21/2010   Method used:   Historical   RxID:   [id number] SYNTHROID 75 MCG TABS (LEVOTHYROXINE SODIUM) take 1 tab every other day (alternating with on the other days)  #90 x 3   Entered by:   Orlan Leavens RMA   Authorized by:   Newt Lukes MD   Signed by:   Orlan Leavens RMA on 05/21/2010   Method used:   Historical   RxID:   [id number] SYNTHROID 50 MCG TABS (LEVOTHYROXINE SODIUM) take 1 tab every other day (alternating with on the other days)  #90 x 3   Entered by:   Orlan Leavens RMA   Authorized by:   Newt Lukes MD   Signed by:   Orlan Leavens RMA on 05/21/2010   Method used:   Historical   RxID:   [id number]

## 2010-06-19 NOTE — Progress Notes (Signed)
Summary: rx refill req  Phone Note Refill Request Message from:  Fax from Pharmacy on May 07, 2010 11:09 AM  Refills Requested: Medication #1:  DIAZEPAM 5 MG TABS take 1 by mouth once daily as needed # 30   Last Refilled: [facility]/2009 [facility] Last ov 01/14/10 Is this ok to refill?  Next Appointment Scheduled: 05/21/10 Initial call taken by: Orlan Leavens RMA,  May 07, 2010 11:12 AM Caller: Patient Reason for Call: Refill Medication Summary of Call: Pt is requesting refill of Diazepam-please advise Initial call taken by: Brenton Grills CMA Duncan Dull),  May 07, 2010 10:43 AM  Follow-up for Phone Call        yes - ok to fill as prev rx'd - thanks Follow-up by: Newt Lukes MD,  May 07, 2010 12:01 PM  Additional Follow-up for Phone Call Additional follow up Details #1::        Rx called to [facility] pt informed via VM Additional Follow-up by: Brenton Grills CMA Duncan Dull),  May 07, 2010 1:49 PM    Prescriptions: DIAZEPAM 5 MG TABS (DIAZEPAM) take 1 by mouth once daily as needed  #30 x 1   Entered by:   Brenton Grills CMA (AAMA)   Authorized by:   Newt Lukes MD   Signed by:   Brenton Grills CMA (AAMA) on 05/07/2010   Method used:   Telephoned to ...       [facility] [address] ([facility]ail)       [address]       [place], [place]  [address]       Ph: [phone] or [phone]       Fax: [phone]   RxID:   [id number]

## 2010-06-19 NOTE — Assessment & Plan Note (Signed)
Summary: PER PT [name]   Vital Signs:  Patient profile:   70 year old female Height:      63 inches (160.02 cm) Weight:      117.4 pounds (53.36 kg) O2 Sat:      95 % on Room air Temp:     97.6 degrees F (36.44 degrees C) oral Pulse rate:   78 / minute BP sitting:   110 / 64  (left arm) Cuff size:   regular  Vitals Entered By: Marilyn Blake RMA (May 21, 2010 1:12 PM)  O2 Flow:  Room air CC: follow-up visit Is Patient Diabetic? No Pain Assessment Patient in pain? no        Primary Care Provider:  Newt Lukes MD  CC:  follow-up visit.  History of Present Illness: here for 4 month f/u  review chronic med issues and medications:  bronchiectasis - follows with pulm for same - reviewed flare fall 2011 - currently, breathing close to baseline and no hemopytsis - no SOB or fever, scant AM clear sputum and nasal discharge  GERD - follows with GI ([facility] for same) - a/w generalized chroinc abd pain bentyl stopped 12/2009 due to not being filled by insurance d/t "high risk" med category  hypothyroid -  reports compliance with ongoing medical treatment and no changes in medication dose or frequency. denies adverse side effects related to current therapy.  no weight or bowel changes  dyslipidemia - reports compliance with ongoing medical treatment and no changes in medication dose or frequency. denies adverse side effects related to current therapy.  no myalgias or new GI pain  chronic low BP - sbp 90-100 usual per pt - no presync/sync, no CP  anxiety - uses Valium on as needed basis   Clinical Review Panels:  Immunizations   Last Tetanus Booster:  Td (03/27/2009)   Last Flu Vaccine:  Fluvax 3+ (02/03/2010)   Last Pneumovax:  Pneumovax (08/14/2008)  Lipid Management   Cholesterol:  195 (01/14/2010)   LDL (bad choesterol):  109 (01/14/2010)   HDL (good cholesterol):  61.50 (01/14/2010)   Triglycerides:  67 (04/16/2006)  CBC   WBC:  9.5  (01/21/2010)   RBC:  3.92 (01/21/2010)   Hgb:  12.6 (01/21/2010)   Hct:  37.2 (01/21/2010)   Platelets:  177.0 (01/21/2010)   MCV  94.9 (01/21/2010)   MCHC  33.9 (01/21/2010)   RDW  13.9 (01/21/2010)   PMN:  69.7 (01/21/2010)   Lymphs:  22.1 (01/21/2010)   Monos:  7.4 (01/21/2010)   Eosinophils:  0.5 (01/21/2010)   Basophil:  0.3 (01/21/2010)  Complete Metabolic Panel   Glucose:  101 (07/24/2008)   Sodium:  142 (07/24/2008)   Potassium:  4.5 (07/24/2008)   Chloride:  103 (07/24/2008)   CO2:  32 (07/24/2008)   BUN:  15 (07/24/2008)   Creatinine:  0.9 (07/24/2008)   Albumin:  3.7 (05/27/2009)   Total Protein:  7.8 (05/27/2009)   Calcium:  9.9 (07/24/2008)   Total Bili:  0.8 (05/27/2009)   Alk Phos:  84 (05/27/2009)   SGPT (ALT):  18 (05/27/2009)   SGOT (AST):  27 (05/27/2009)   Current Medications (verified): 1)  Axid 150 Mg Caps (Nizatidine) .... Take One By Mouth Two Times A Day 2)  Simvastatin 40 Mg Tabs (Simvastatin) .... Take One Tablet By Mouth Daily At Bedtime 3)  Multivitamins   Tabs (Multiple Vitamin) .... Take 1 Tablet By Mouth Once A Day 4)  Omega-3 1000 Mg  Caps (Omega-3 Fatty Acids) .... Take 1 Tablet By Mouth Once A Day 5)  Calcium 600 600 Mg  Tabs (Calcium Carbonate) .... Take 1 Tablet By Mouth Once A Day 6)  Benzonatate 100 Mg Caps (Benzonatate) .Marland Kitchen.. 1 or 2 Four Times A Day As Needed Cough 7)  Nitrogylcerin .... As Needed 8)  Gas-X Extra Strength 125 Mg Caps (Simethicone) .... Take One By Mouth Before Meals As Needed 9)  Synthroid 50 Mcg Tabs (Levothyroxine Sodium) .... Take 1 Tab Every Other Day (Alternating With On The Other Days) 10)  Synthroid 75 Mcg Tabs (Levothyroxine Sodium) .... Take 1 Tab Every Other Day (Alternating With On The Other Days) 11)  Triamcinolone Acetonide 0.1 % Crea (Triamcinolone Acetonide) .... Apply 1-2 Times A Day 12)  Diazepam 5 Mg Tabs (Diazepam) .... Take 1 By Mouth Once Daily As Needed 13)  Fluconazole 100 Mg Tabs  (Fluconazole) .Marland Kitchen.. 1 By Mouth Once Daily As Needed For Yeast Symptoms  Allergies (verified): 1)  ! Avelox 2)  ! Ethambutol Hcl (Ethambutol Hcl)  Past History:  Past Medical History: Pneumonia 2004, 2009 - pneumovax done GERD  colon polyps, 1999 /NORMAL COLON 2007 BRONCHIECTASIS- M.avium 2004, hemoptysis 2009 hypothyroid Allergic Rhinitis HYPERLIPIDEMIA   MD roster: [facility]  Review of Systems  The patient denies weight gain, chest pain, abdominal pain, and melena.    Physical Exam  General:  alert, well-developed, well-nourished, and cooperative to examination.   Lungs:  normal respiratory effort, no intercostal retractions or use of accessory muscles; normal breath sounds bilaterally - no crackles and no wheezes.    Heart:  normal rate, regular rhythm, no murmur, and no rub. BLE without edema.  Psych:  Oriented X3, memory intact for recent and remote, normally interactive, good eye contact, not anxious appearing, not depressed appearing, and not agitated.      Impression & Recommendations:  Problem # 1:  HYPERLIPIDEMIA (ICD-272.4)  Her updated medication list for this problem includes:    Simvastatin 40 Mg Tabs (Simvastatin) .Marland Kitchen... Take one tablet by mouth daily at bedtime  Labs Reviewed: SGOT: 27 (05/27/2009)   SGPT: 18 (05/27/2009)   HDL:61.50 (01/14/2010), 61.40 (05/27/2009)  LDL:109 (01/14/2010), 77 (16/02/9603)  Chol:195 (01/14/2010), 149 (05/27/2009)  Trig:123.0 (01/14/2010), 53.0 (05/27/2009)  Problem # 2:  HYPOTHYROIDISM (ICD-244.9)  Her updated medication list for this problem includes:    Synthroid 50 Mcg Tabs (Levothyroxine sodium) .Marland Kitchen... Take 1 tab every other day (alternating with on the other days)    Synthroid 75 Mcg Tabs (Levothyroxine sodium) .Marland Kitchen... Take 1 tab every other day (alternating with on the other days)  Labs Reviewed: TSH: 1.50 (01/14/2010)    Chol: 195 (01/14/2010)   HDL: 61.50 (01/14/2010)    LDL: 109 (01/14/2010)   TG: 123.0 (01/14/2010)  Problem # 3:  IRRITABLE BOWEL SYNDROME (ICD-564.1) followup with GI as needed - considering change to [facility] or different md  mgmt as per GI - currently symptoms stable - prior OV reviewed: CONTINUE ROBINUL FORTE TWICE DAILY-MAY SWITCH TO BENTYL  ADD FLORASTOR TWICE DAILY X ONE MONTH ADVISED MIRALAX,4 DOSES TO PURGE BOWEL THEN AS NEEDED DAILY FOLLOW UP SR KAPLAN AS NEEDED.  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 Take one by mouth two times a day 2)  Simvastatin 40 Mg Tabs (Simvastatin) .... Take one tablet by mouth daily at bedtime 3)  Multivitamins Tabs (Multiple vitamin) .... Take 1 tablet by mouth once a day 4)  Omega-3 1000 Mg Caps (Omega-3 fatty acids) .... Take 1 tablet by mouth once a day 5)  Calcium 600 600 Mg Tabs (Calcium carbonate) .... Take 1 tablet by mouth once a day 6)  Benzonatate 100 Mg Caps (Benzonatate) .Marland Kitchen.. 1 or 2 four times a day as needed cough 7)  Nitrogylcerin  .... As needed 8)  Gas-x Extra Strength 125 Mg Caps (Simethicone) .... Take one by mouth before meals as needed 9)  Synthroid 50 Mcg Tabs (Levothyroxine sodium) .... Take 1 tab every other day (alternating with on the other days) 10)  Synthroid 75 Mcg Tabs (Levothyroxine sodium) .... Take 1 tab every other day (alternating with on the other days) 11)  Triamcinolone Acetonide 0.1 % Crea (Triamcinolone acetonide) .... Apply 1-2 times a day 12)  Diazepam 5 Mg Tabs (Diazepam) .... Take 1 by mouth once daily as needed 13)  Fluconazole 100 Mg Tabs (Fluconazole) .Marland Kitchen.. 1 by mouth once daily as needed for yeast symptoms  Patient Instructions: 1)  it was good to see you today.  2)  medications  reviewed  - no changes at this time 3)  Please schedule a follow-up appointment in 4-6 months to check cholesterol and thyroid, call sooner if problems.  4)  If you would like to change to different GI group ([facility]) or different GI doctor, call and let me know or talk to amy as we discussed 5)  refills to local pharm done today Prescriptions: TRIAMCINOLONE ACETONIDE 0.1 % CREA (TRIAMCINOLONE ACETONIDE) apply 1-2 times a day  #1 x 2   Entered and Authorized by:   Newt Lukes MD   Signed by:   Newt Lukes MD on 05/21/2010   Method used:   [facility]        [facility] [address] [facility]       [address]       [place], [place]  [address]       Ph: [phone] or [phone]       Fax: [phone]   RxID:   [id number] AXID 150 MG CAPS (NIZATIDINE) take one by mouth two times a day  #60 x 2   Entered and Authorized by:   Newt Lukes MD   Signed by:   Newt Lukes MD on 05/21/2010   Method used:   [facility]        [facility]  [place] [address]       [address]       [place], [place]  [address]       Ph: [phone] or [phone]       Fax: [phone]   RxID:   [id number]    Orders Added: 1)  Est. Patient Level IV [76160]

## 2010-06-25 NOTE — Letter (Signed)
Summary: [facility]   Imported By: Sherian Rein 06/18/2010 14:17:24  _____________________________________________________________________  External Attachment:    Type:   Image     Comment:   External Document

## 2010-07-23 ENCOUNTER — Encounter: Payer: Self-pay | Admitting: Internal Medicine

## 2010-07-29 NOTE — Letter (Addendum)
Summary: [facility]  [facility]   Imported By: Lennie Odor 07/25/2010 10:51:03  _____________________________________________________________________  External Attachment:    Type:   Image     Comment:   External Document

## 2010-08-04 ENCOUNTER — Encounter: Payer: Self-pay | Admitting: Internal Medicine

## 2010-08-04 ENCOUNTER — Other Ambulatory Visit: Payer: Self-pay | Admitting: Internal Medicine

## 2010-08-04 ENCOUNTER — Ambulatory Visit (INDEPENDENT_AMBULATORY_CARE_PROVIDER_SITE_OTHER)
Admission: RE | Admit: 2010-08-04 | Discharge: 2010-08-04 | Disposition: A | Discharge status: Home / Self Care | Payer: Medicare Other | Source: Ambulatory Visit | Attending: Internal Medicine | Admitting: Internal Medicine

## 2010-08-04 ENCOUNTER — Ambulatory Visit (INDEPENDENT_AMBULATORY_CARE_PROVIDER_SITE_OTHER): Payer: Medicare Other | Admitting: Internal Medicine

## 2010-08-04 DIAGNOSIS — A318 Other mycobacterial infections: Secondary | ICD-10-CM

## 2010-08-04 DIAGNOSIS — J479 Bronchiectasis, uncomplicated: Secondary | ICD-10-CM

## 2010-08-04 DIAGNOSIS — H109 Unspecified conjunctivitis: Secondary | ICD-10-CM

## 2010-08-04 DIAGNOSIS — J309 Allergic rhinitis, unspecified: Secondary | ICD-10-CM

## 2010-08-14 NOTE — Assessment & Plan Note (Signed)
Summary: ROV//SH   Copy to:  n/a Primary Provider/Referring Provider:  Newt Lukes MD  CC:  Follow up visit-increased allergies x 2 months; red eyes hurts to use Zisine eye drops..  History of Present Illness: December 17, 2009- MAIC bronchitis hx, allergic rhinitis At first of May she had rapid onset of a febrile illness that responded nicely to Z pak. Now for several weeks she has noted a cough with green sputum, hoarseness.No fever, but some headache which is unusual. Tessalon helps some. Denies headache, sneeze or discolored nasal discharge.  February 03, 2010- Hx MAIC bronchitis, bronchiectasis, allergic rhinitis We gave biaxin in August but she stopped that because of GI upset. Then she called with some hemoptysis September 1. CXR showed patch infiltrates. We sent augmentin on Sept 6 when WBC was 9500. Chest now feels fine with no more blood. Denies night sweat or fever and feeling well. She had been having some recent headache that she treated successfully with zyrtec.  August 04, 2010- Hx MAIC bronchitis, bronchiectasis, allergic rhinitis Nurse-CC: Follow up visit-increased allergies x 2 months; red eyes hurts to use Visine eye drops. Has used saline nasal rinse to abort sinus infection twice.  New c/o red, irritated eyes burning. Has tried several antihistamines and allergy eye drops. Routine opth exam ok in September. She asks if I would give script Tobradex eye drops that helped something similar years ago- brings old bottle.. Some cough,not bad, scant. having night some sweats. Denies chest pain, phlegm, nodes, blood. CXR- September, 2011 bilateral scarring, bronchiectasis mid zones bilaterally      Preventive Screening-Counseling & Management  Alcohol-Tobacco     Smoking Status: never  Current Medications (verified): 1)  Prilosec 20 Mg Cpdr (Omeprazole) .... Take 1 By Mouth Once Daily 2)  Simvastatin 40 Mg Tabs (Simvastatin) .... Take One Tablet By Mouth Daily At  Bedtime 3)  Multivitamins   Tabs (Multiple Vitamin) .... Take 1 Tablet By Mouth Once A Day 4)  Omega-3 1000 Mg  Caps (Omega-3 Fatty Acids) .... Take 1 Tablet By Mouth Once A Day 5)  Calcium 600 600 Mg  Tabs (Calcium Carbonate) .... Take 1 Tablet By Mouth Once A Day 6)  Benzonatate 100 Mg Caps (Benzonatate) .Marland Kitchen.. 1 or 2 Four Times A Day As Needed Cough 7)  Nitrogylcerin .... As Needed 8)  Gas-X Extra Strength 125 Mg Caps (Simethicone) .... Take One By Mouth Before Meals As Needed 9)  Synthroid 50 Mcg Tabs (Levothyroxine Sodium) .... Take 1 Tab Every Other Day (Alternating With On The Other Days) 10)  Synthroid 75 Mcg Tabs (Levothyroxine Sodium) .... Take 1 Tab Every Other Day (Alternating With On The Other Days) 11)  Triamcinolone Acetonide 0.1 % Crea (Triamcinolone Acetonide) .... Apply 1-2 Times A Day 12)  Diazepam 5 Mg Tabs (Diazepam) .... Take 1 By Mouth Once Daily As Needed 13)  Fluconazole 100 Mg Tabs (Fluconazole) .Marland Kitchen.. 1 By Mouth Once Daily As Needed For Yeast Symptoms 14)  Zyrtec Hives Relief 10 Mg Tabs (Cetirizine Hcl) .... Take 1 By Mouth Once Daily 15)  Benadryl 25 Mg Tabs (Diphenhydramine Hcl) .... Take 1 By Mouth Once Daily As Needed  Allergies (verified): 1)  ! Avelox 2)  ! Ethambutol Hcl (Ethambutol Hcl)  Past History:  Past Medical History: Last updated: 05/21/2010 Pneumonia 2004, 2009 - pneumovax done GERD  colon polyps, 1999 /NORMAL COLON 2007 BRONCHIECTASIS- M.avium 2004, hemoptysis 2009 hypothyroid Allergic Rhinitis HYPERLIPIDEMIA   MD roster: pulm-[facility]  Past Surgical History: Last updated: 08/15/2008 Thyroidectomy for Hurthle Cell tumor Hysterectomy S/P BLADDER SUSPENSION Carpal Tunnel Release  Family History: Last updated: 03/14/2009 Mother - cervical cancer age 21 No FH of Colon Cancer: father died of a heart attack at age 14  Social History: Last updated: 07/24/2008 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  Alcohol Use - no Daily Caffeine Use Illicit Drug Use - no  Risk Factors: Smoking Status: never (08/04/2010)  Review of Systems      See HPI       The patient complains of productive cough and nasal congestion/difficulty breathing through nose.  The patient denies shortness of breath with activity, shortness of breath at rest, non-productive cough, coughing up blood, chest pain, irregular heartbeats, acid heartburn, indigestion, loss of appetite, weight change, abdominal pain, difficulty swallowing, sore throat, tooth/dental problems, headaches, sneezing, itching, ear ache, anxiety, depression, hand/feet swelling, and rash.         night sweats, worse  Vital Signs:  Patient profile:   70 year old female Height:      63 inches Weight:      121.13 pounds BMI:     21.53 O2 Sat:      97 % on Room air Pulse rate:   69 / minute BP sitting:   110 / 58  (left arm) Cuff size:   regular  Vitals Entered By: Reynaldo Minium CMA (August 04, 2010 10:20 AM)  O2 Flow:  Room air CC: Follow up visit-increased allergies x 2 months; red eyes hurts to use Zisine eye drops.   Physical Exam  Additional Exam:  General: A/Ox3; pleasant and cooperative, NAD, slender, comfortable appearing SKIN: no rash, lesions NODES: no lymphadenopathy HEENT: [place]/AT, EOM- WNL, Conjuctivae- clear, minimally injected, PERRLA, TM-WNL, Nose- clear, Throat- clear and wnl, no erythema or drainage, Mallampati  II NECK: Supple w/ fair ROM, JVD- none, normal carotid impulses w/o bruits Thyroid- , no stridor. CHEST:clear to P&A HEART: RRR, no m/g/r heard ABDOMEN: Soft  EXB:MWUX, nl pulses, no edema  NEURO: Grossly intact to observation      Impression & Recommendations:  Problem # 1:  ALLERGIC RHINITIS (ICD-477.9) Rhinoconjunctivitis. we will give tobradex as she asks, but steroid component may be the therapeutically usefull drug.  Her updated medication list for this problem includes:    Zyrtec Hives  Relief 10 Mg Tabs (Cetirizine hcl) .Marland Kitchen... Take 1 by mouth once daily    Benadryl 25 Mg Tabs (Diphenhydramine hcl) .Marland Kitchen... Take 1 by mouth once daily as needed  Problem # 2:  BRONCHIECTASIS (ICD-494.0) Increased sweats. I don't hear much on exam but will update CXR for possible progression.   Problem # 3:  CONJUNCTIVITIS (ICD-372.30)  I don't see much, and this may be pollen induced. Since she as been trying approtiate meds, Iagreed to let her try the Tobradex she thinks worked before.   Orders: Est. Patient Level III (32440)  Medications Added to Medication List This Visit: 1)  Prilosec 20 Mg Cpdr (Omeprazole) .... Take 1 by mouth once daily 2)  Zyrtec Hives Relief 10 Mg Tabs (Cetirizine hcl) .... Take 1 by mouth once daily 3)  Benadryl 25 Mg Tabs (Diphenhydramine hcl) .... Take 1 by mouth once daily as needed 4)  Tobradex 0.3-0.1 % Susp (Tobramycin-dexamethasone) .Marland Kitchen.. 1 drop right eye, four times a day  Other Orders: T-2 View CXR ([id number])  Patient Instructions: 1)  Please schedule a follow-up appointment in 6 months. Please call sooner as needed. 2)  A  chest x-ray has been recommended.  Your imaging study may require preauthorization.  3)  script sent for tobradex eye drop Prescriptions: TOBRADEX 0.3-0.1 % SUSP (TOBRAMYCIN-DEXAMETHASONE) 1 drop right eye, four times a day  #1 vial x 0   Entered and Authorized by:   Waymon Budge MD   Signed by:   Waymon Budge MD on 08/04/2010   Method used:   [facility]        [facility] [address]       [address]       [place], [place]  [address]       Ph: [phone] or [phone]       Fax: [phone]   RxID:   [id number]

## 2010-08-21 LAB — COMPREHENSIVE METABOLIC PANEL
Albumin: 3.9 g/dL (ref 3.5–5.2)
BUN: 11 mg/dL (ref 6–23)
Calcium: 9.3 mg/dL (ref 8.4–10.5)
Chloride: 104 mEq/L (ref 96–112)
Creatinine, Ser: 0.7 mg/dL (ref 0.4–1.2)
Total Bilirubin: 0.3 mg/dL (ref 0.3–1.2)

## 2010-08-21 LAB — D-DIMER, QUANTITATIVE: D-Dimer, Quant: 0.25 ug/mL-FEU (ref 0.00–0.48)

## 2010-08-21 LAB — POCT CARDIAC MARKERS
CKMB, poc: 1.4 ng/mL (ref 1.0–8.0)
Myoglobin, poc: 29.9 ng/mL (ref 12–200)

## 2010-08-21 LAB — CBC
HCT: 39.1 % (ref 36.0–46.0)
Hemoglobin: 13.1 g/dL (ref 12.0–15.0)
Platelets: 200 10*3/uL (ref 150–400)
RDW: 13.6 % (ref 11.5–15.5)
WBC: 6.8 10*3/uL (ref 4.0–10.5)

## 2010-08-21 LAB — DIFFERENTIAL
Basophils Absolute: 0 10*3/uL (ref 0.0–0.1)
Lymphocytes Relative: 25 % (ref 12–46)
Monocytes Absolute: 0.6 10*3/uL (ref 0.1–1.0)
Neutro Abs: 4.5 10*3/uL (ref 1.7–7.7)

## 2010-09-25 ENCOUNTER — Encounter: Payer: Self-pay | Admitting: Internal Medicine

## 2010-09-30 ENCOUNTER — Encounter: Payer: Self-pay | Admitting: Internal Medicine

## 2010-11-11 ENCOUNTER — Encounter: Payer: Self-pay | Admitting: Internal Medicine

## 2010-11-14 ENCOUNTER — Other Ambulatory Visit: Payer: Self-pay | Admitting: Internal Medicine

## 2010-11-14 DIAGNOSIS — Z1231 Encounter for screening mammogram for malignant neoplasm of breast: Secondary | ICD-10-CM

## 2010-11-21 ENCOUNTER — Ambulatory Visit
Admission: RE | Admit: 2010-11-21 | Discharge: 2010-11-21 | Disposition: A | Discharge status: Home / Self Care | Payer: Medicare Other | Source: Ambulatory Visit | Attending: Internal Medicine | Admitting: Internal Medicine

## 2010-11-21 DIAGNOSIS — Z1231 Encounter for screening mammogram for malignant neoplasm of breast: Secondary | ICD-10-CM

## 2011-01-30 ENCOUNTER — Ambulatory Visit: Payer: Medicare Other | Admitting: Internal Medicine

## 2011-02-03 ENCOUNTER — Ambulatory Visit (INDEPENDENT_AMBULATORY_CARE_PROVIDER_SITE_OTHER): Payer: Medicare Other | Admitting: Internal Medicine

## 2011-02-03 ENCOUNTER — Other Ambulatory Visit: Payer: Self-pay | Admitting: Internal Medicine

## 2011-02-03 ENCOUNTER — Encounter: Payer: Self-pay | Admitting: Internal Medicine

## 2011-02-03 DIAGNOSIS — J479 Bronchiectasis, uncomplicated: Secondary | ICD-10-CM

## 2011-02-03 DIAGNOSIS — Z23 Encounter for immunization: Secondary | ICD-10-CM

## 2011-02-03 MED ORDER — DOXYCYCLINE HYCLATE 100 MG PO TABS: ORAL_TABLET | ORAL | Status: DC

## 2011-02-03 NOTE — Patient Instructions (Signed)
Antibiotic script for doxycycline sent   Flu vax  Extra fluids and rest next few days- please call as needed

## 2011-02-07 ENCOUNTER — Encounter: Payer: Self-pay | Admitting: Internal Medicine

## 2011-02-07 NOTE — Progress Notes (Signed)
  Subjective:    Patient ID: Marilyn Blake, female    DOB: [birth year], 70 y.o.   MRN: [id number]  HPI 02/03/2011-70 year old female never smoker followed for bronchiectasis, history of MAIC bronchitis, allergic rhinitis. Last here 08/04/2010 Chest x-ray in September of 2011 had shown bilateral scarring, bronchiectasis in the mid zones bilaterally CXR 08/04/2010-stable bronchiectasis, scarring, micro-nodularities in the small area of cavitary change in the right upper lobe consistent with chronic Mycobacterium infection. Acute visit-2 days of chills or lays temperature 100.8 yesterday, sore throat and nasal congestion. Cough is productive of some yellow sputum. No GI complaint.  Review of Systems Constitutional:   No-   weight loss, night sweats, , chills, fatigue, lassitude. HEENT:   No-  headaches, difficulty swallowing, tooth/dental problems,+ sore throat,       No-  sneezing, itching, ear ache,+ nasal congestion, post nasal drip,  CV:  No-   chest pain, orthopnea, PND, swelling in lower extremities, anasarca,dizziness, palpitations Resp: No-   shortness of breath with exertion or at rest.              No-   productive cough,  No non-productive cough,  No-  coughing up of blood.              + change in color of mucus.  No- wheezing.   Skin: No-   rash or lesions. GI:  No-   heartburn, indigestion, abdominal pain, nausea, vomiting, diarrhea,                 change in bowel habits, loss of appetite GU: No-   dysuria, change in color of urine, no urgency or frequency.  No- flank pain. MS:  No-   joint pain or swelling.  No- decreased range of motion.  No- back pain. Neuro- grossly normal to observation, Or:  Psych:  No- change in mood or affect. No depression or anxiety.  No memory loss.      Objective:   Physical Exam General- Alert, Oriented, Affect-appropriate, Distress- none acute Skin- rash-none, lesions- none, excoriation- none Lymphadenopathy- none Head- atraumatic  Eyes- Gross vision intact, PERRLA, conjunctivae clear secretions            Ears- Hearing, canals- normal            Nose- Clear, No-Septal dev, mucus, polyps, erosion, perforation             Throat- Mallampati II , mucosa clear- not red , drainage- none, tonsils- atrophic,+mild hoarseness Neck- flexible , trachea midline, no stridor , thyroid nl, carotid no bruit Chest - symmetrical excursion , unlabored           Heart/CV- RRR , no murmur , no gallop  , no rub, nl s1 s2                           - JVD- none , edema- none, stasis changes- none, varices- none           Lung- minimal rhonchi, wheeze- none, cough- none , dullness-none, rub- none           Chest wall-  Abd- tender-no, distended-no, bowel sounds-present, HSM- no Br/ Gen/ Rectal- Not done, not indicated Extrem- cyanosis- none, clubbing, none, atrophy- none, strength- nl Neuro- grossly intact to observation         Assessment & Plan:

## 2011-02-07 NOTE — Assessment & Plan Note (Addendum)
Current episode is an acute bronchitis, likely a URI/bronchitis syndrome. Plan doxycycline and fluids, flu vaccine

## 2011-03-11 ENCOUNTER — Ambulatory Visit (INDEPENDENT_AMBULATORY_CARE_PROVIDER_SITE_OTHER): Payer: Medicare Other

## 2011-03-11 ENCOUNTER — Inpatient Hospital Stay (INDEPENDENT_AMBULATORY_CARE_PROVIDER_SITE_OTHER)
Admission: RE | Admit: 2011-03-11 | Discharge: 2011-03-11 | Disposition: A | Discharge status: Home / Self Care | Payer: Medicare Other | Source: Ambulatory Visit | Attending: Family Medicine | Admitting: Family Medicine

## 2011-03-11 DIAGNOSIS — IMO0002 Reserved for concepts with insufficient information to code with codable children: Secondary | ICD-10-CM

## 2011-03-12 ENCOUNTER — Inpatient Hospital Stay (INDEPENDENT_AMBULATORY_CARE_PROVIDER_SITE_OTHER)
Admission: RE | Admit: 2011-03-12 | Discharge: 2011-03-12 | Disposition: A | Discharge status: Home / Self Care | Payer: Medicare Other | Source: Ambulatory Visit | Attending: Family Medicine | Admitting: Family Medicine

## 2011-03-12 DIAGNOSIS — IMO0002 Reserved for concepts with insufficient information to code with codable children: Secondary | ICD-10-CM

## 2011-06-09 ENCOUNTER — Other Ambulatory Visit: Payer: Self-pay | Admitting: *Deleted

## 2011-06-09 MED ORDER — DIAZEPAM 5 MG PO TABS: 5.0000 mg | ORAL_TABLET | Freq: Every day | ORAL | Status: DC | PRN

## 2011-06-09 NOTE — Telephone Encounter (Signed)
Rx faxed to [facility].  

## 2011-06-09 NOTE — Telephone Encounter (Signed)
R'cd fax from [facility] for refill of Diazepam-last written 05/07/2010 #30 with 1 refill-VAL pt-please advise  Last OV-05/2010

## 2011-06-10 MED ORDER — DIAZEPAM 5 MG PO TABS: 5.0000 mg | ORAL_TABLET | Freq: Every day | ORAL | Status: DC | PRN

## 2011-06-10 NOTE — Telephone Encounter (Signed)
[facility] @ [phone]....06/10/11@10 :07am/LMB

## 2011-06-16 ENCOUNTER — Encounter: Payer: Self-pay | Admitting: Internal Medicine

## 2011-06-16 ENCOUNTER — Ambulatory Visit (INDEPENDENT_AMBULATORY_CARE_PROVIDER_SITE_OTHER)
Admission: RE | Admit: 2011-06-16 | Discharge: 2011-06-16 | Disposition: A | Discharge status: Home / Self Care | Payer: Medicare Other | Source: Ambulatory Visit | Attending: Internal Medicine | Admitting: Internal Medicine

## 2011-06-16 ENCOUNTER — Ambulatory Visit (INDEPENDENT_AMBULATORY_CARE_PROVIDER_SITE_OTHER): Payer: Medicare Other | Admitting: Internal Medicine

## 2011-06-16 DIAGNOSIS — F5105 Insomnia due to other mental disorder: Secondary | ICD-10-CM | POA: Diagnosis not present

## 2011-06-16 DIAGNOSIS — F419 Anxiety disorder, unspecified: Secondary | ICD-10-CM

## 2011-06-16 DIAGNOSIS — J479 Bronchiectasis, uncomplicated: Secondary | ICD-10-CM | POA: Diagnosis not present

## 2011-06-16 DIAGNOSIS — R042 Hemoptysis: Secondary | ICD-10-CM | POA: Diagnosis not present

## 2011-06-16 DIAGNOSIS — F411 Generalized anxiety disorder: Secondary | ICD-10-CM | POA: Diagnosis not present

## 2011-06-16 DIAGNOSIS — R079 Chest pain, unspecified: Secondary | ICD-10-CM | POA: Diagnosis not present

## 2011-06-16 DIAGNOSIS — R918 Other nonspecific abnormal finding of lung field: Secondary | ICD-10-CM | POA: Diagnosis not present

## 2011-06-16 DIAGNOSIS — F489 Nonpsychotic mental disorder, unspecified: Secondary | ICD-10-CM | POA: Diagnosis not present

## 2011-06-16 DIAGNOSIS — J984 Other disorders of lung: Secondary | ICD-10-CM | POA: Diagnosis not present

## 2011-06-16 MED ORDER — ZALEPLON 5 MG PO CAPS: ORAL_CAPSULE | ORAL | Status: DC

## 2011-06-16 NOTE — Assessment & Plan Note (Signed)
Plan-chest x-ray so we can evaluate the status of her MAIC.

## 2011-06-16 NOTE — Assessment & Plan Note (Signed)
Chronic intermittent insomnia, multifactorial. She admits a "busy brain" pattern worse if she is under stress. There is ache some pains, dental work and arthritis pains also contribute. We discussed the use of a short half-life medication such as Sonata when needed.

## 2011-06-16 NOTE — Assessment & Plan Note (Signed)
She keeps some cough but has not been bringing up any more blood.

## 2011-06-16 NOTE — Patient Instructions (Signed)
Order- CXR  - dx bronchiectasis/ hx MAIC  Script for Sonata/ zalpelon - use for sleep if needed.

## 2011-06-16 NOTE — Progress Notes (Signed)
Patient ID: Marilyn Blake, female    DOB: [birth year], 71 y.o.   MRN: [id number]  HPI 02/03/2011-71 year old female never smoker followed for bronchiectasis, history of MAIC bronchitis, allergic rhinitis. Last here 08/04/2010 Chest x-ray in September of 2011 had shown bilateral scarring, bronchiectasis in the mid zones bilaterally CXR 08/04/2010-stable bronchiectasis, scarring, micro-nodularities in the small area of cavitary change in the right upper lobe consistent with chronic Mycobacterium infection. Acute visit-2 days of chills or lays temperature 100.8 yesterday, sore throat and nasal congestion. Cough is productive of some yellow sputum. No GI complaint.  06/16/11- 71 year old female never smoker followed for bronchiectasis, history of MAIC bronchitis, allergic rhinitis, insomnia. Her breathing has been comfortable. Not much daily cough. More easily winded with activities of daily living over the last 2 weeks but nothing very specific. A few vague twinge pains high in the left upper chest without palpitation or swelling. She has had to have a lot of dental work this winter and has often been uncomfortable from that and her age-related arthritis complaints. As a result she is not sleeping as well. She has taken occasional diazepam, usually one half tablet. We discussed alternatives including a short half-life medication that would be out of her system in the morning.  Review of Systems Constitutional:   No-   weight loss, night sweats, , chills, fatigue, lassitude. HEENT:   No-  headaches, difficulty swallowing, tooth/dental problems, sore throat,       No-  sneezing, itching, ear ache,+ nasal congestion, post nasal drip,  CV:  No-   chest pain, orthopnea, PND, swelling in lower extremities, anasarca,dizziness, palpitations Resp: + shortness of breath with exertion or at rest.              No-   productive cough,  No non-productive cough,  No-  coughing up of blood.              No- change  in color of mucus.  No- wheezing.   Skin: No-   rash or lesions. GI:  No-   heartburn, indigestion, abdominal pain, nausea, vomiting, diarrhea,                 change in bowel habits, loss of appetite GU:  MS:  No-   joint pain or swelling.  No- decreased range of motion.  No- back pain. Neuro- grossly normal to observation,   Psych:  No- change in mood or affect. No depression or anxiety.  No memory loss.      Objective:   Physical Exam General- Alert, Oriented, Affect-appropriate, Distress- none acute, petite lady Skin- rash-none, lesions- none, excoriation- none Lymphadenopathy- none Head- atraumatic            Eyes- Gross vision intact, PERRLA, conjunctivae clear secretions            Ears- Hearing, canals- normal            Nose- Clear, No-Septal dev, mucus, polyps, erosion, perforation             Throat- Mallampati II , mucosa clear- not red , drainage- none, tonsils- atrophic,+mild hoarseness Neck- flexible , trachea midline, no stridor , thyroid nl, carotid no bruit Chest - symmetrical excursion , unlabored           Heart/CV- RRR , no murmur , no gallop  , no rub, nl s1 s2                           -  JVD- none , edema- none, stasis changes- none, varices- none           Lung- minimal rhonchi, unlabored, wheeze- none, cough- none , dullness-none, rub- none           Chest wall-  Abd- Br/ Gen/ Rectal- Not done, not indicated Extrem- cyanosis- none, clubbing, none, atrophy- none, strength- nl Neuro- grossly intact to observation

## 2011-06-17 NOTE — Progress Notes (Signed)
Quick Note:  LMTCB ______ 

## 2011-06-18 ENCOUNTER — Ambulatory Visit (INDEPENDENT_AMBULATORY_CARE_PROVIDER_SITE_OTHER): Payer: Medicare Other | Admitting: Internal Medicine

## 2011-06-18 ENCOUNTER — Other Ambulatory Visit (INDEPENDENT_AMBULATORY_CARE_PROVIDER_SITE_OTHER): Payer: Medicare Other

## 2011-06-18 ENCOUNTER — Other Ambulatory Visit: Payer: Self-pay | Admitting: *Deleted

## 2011-06-18 ENCOUNTER — Encounter: Payer: Self-pay | Admitting: Internal Medicine

## 2011-06-18 VITALS — BP 110/72 | HR 69 | Temp 97.0°F | Wt 116.8 lb

## 2011-06-18 DIAGNOSIS — R5381 Other malaise: Secondary | ICD-10-CM

## 2011-06-18 DIAGNOSIS — Z79899 Other long term (current) drug therapy: Secondary | ICD-10-CM

## 2011-06-18 DIAGNOSIS — E039 Hypothyroidism, unspecified: Secondary | ICD-10-CM

## 2011-06-18 DIAGNOSIS — R5383 Other fatigue: Secondary | ICD-10-CM

## 2011-06-18 DIAGNOSIS — Z0000 Encounter for general adult medical examination without abnormal findings: Secondary | ICD-10-CM | POA: Diagnosis not present

## 2011-06-18 DIAGNOSIS — E785 Hyperlipidemia, unspecified: Secondary | ICD-10-CM

## 2011-06-18 DIAGNOSIS — Z13820 Encounter for screening for osteoporosis: Secondary | ICD-10-CM

## 2011-06-18 DIAGNOSIS — Z124 Encounter for screening for malignant neoplasm of cervix: Secondary | ICD-10-CM

## 2011-06-18 LAB — CBC WITH DIFFERENTIAL/PLATELET
Basophils Relative: 0.3 % (ref 0.0–3.0)
Eosinophils Absolute: 0.1 10*3/uL (ref 0.0–0.7)
HCT: 40.2 % (ref 36.0–46.0)
Hemoglobin: 13.7 g/dL (ref 12.0–15.0)
MCHC: 34 g/dL (ref 30.0–36.0)
MCV: 95.8 fl (ref 78.0–100.0)
Monocytes Absolute: 0.5 10*3/uL (ref 0.1–1.0)
Neutro Abs: 2.4 10*3/uL (ref 1.4–7.7)
RBC: 4.19 Mil/uL (ref 3.87–5.11)

## 2011-06-18 LAB — BASIC METABOLIC PANEL
CO2: 33 mEq/L — ABNORMAL HIGH (ref 19–32)
Chloride: 104 mEq/L (ref 96–112)
GFR: 90.4 mL/min (ref >60.00)
Glucose, Bld: 86 mg/dL (ref 70–99)
Potassium: 5.1 mEq/L (ref 3.5–5.1)
Sodium: 141 mEq/L (ref 135–145)

## 2011-06-18 LAB — LIPID PANEL
HDL: 53.6 mg/dL (ref >39.00)
VLDL: 18.6 mg/dL (ref 0.0–40.0)

## 2011-06-18 LAB — HEPATIC FUNCTION PANEL
Bilirubin, Direct: 0 mg/dL (ref 0.0–0.3)
Total Bilirubin: 0.5 mg/dL (ref 0.3–1.2)

## 2011-06-18 MED ORDER — LEVOTHYROXINE SODIUM 50 MCG PO TABS: 50.0000 ug | ORAL_TABLET | ORAL | Status: DC

## 2011-06-18 MED ORDER — LEVOTHYROXINE SODIUM 75 MCG PO TABS: 75.0000 ug | ORAL_TABLET | ORAL | Status: DC

## 2011-06-18 NOTE — Progress Notes (Signed)
Subjective:    Patient ID: Marilyn Blake, female    DOB: 1940-11-26, 71 y.o.   MRN: [id number]  HPI  Here for medicare wellness  Diet: heart healthy  Physical activity: sedentary Depression/mood screen: negative Hearing: intact to whispered voice Visual acuity: grossly normal, performs annual eye exam  ADLs: capable Fall risk: none Home safety: good Cognitive evaluation: intact to orientation, naming, recall and repetition EOL planning: adv directives, full code/ I agree  I have personally reviewed and have noted 1. The patient's medical and social history 2. Their use of alcohol, tobacco or illicit drugs 3. Their current medications and supplements 4. The patient's functional ability including ADL's, fall risks, home safety risks and hearing or visual impairment. 5. Diet and physical activities 6. Evidence for depression or mood disorders  Also reviewed chronic medical issues -  bronchiectasis - follows with pulm for same - reviewed flare fall 2011 - currently, breathing close to baseline and no hemopytsis - no SOB or fever, scant AM clear sputum and nasal discharge   GERD - follows with GI ([facility] for same) -  associated with generalized chroinc abd pain  bentyl stopped 12/2009 due to not being filled by insurance > "high risk" med category   hypothyroid - reports compliance with ongoing medical treatment and no changes in medication dose or frequency. denies adverse side effects related to current therapy. no weight or bowel changes   dyslipidemia - reports compliance with ongoing medical treatment and no changes in medication dose or frequency. denies adverse side effects related to current therapy. no myalgias or new GI pain   chronic low BP - sbp 90-100 usual per pt - no presync/sync, no chest pain    anxiety - uses Valium on as needed basis    Past Medical History  Diagnosis Date  . ALLERGIC RHINITIS   . ANXIETY   . BACTEREMIA, MYCOBACTERIUM AVIUM COMPLEX     . BRONCHIECTASIS   . COLONIC POLYPS, HX OF   . GERD   . GOUT   . HYPERLIPIDEMIA   . HYPOTHYROIDISM   . Irritable bowel syndrome   . OSTEOPENIA    Family History  Problem Relation Age of Onset  . Cancer Mother     cervical cancer @ 63   History  Substance Use Topics  . Smoking status: Never Smoker   . Smokeless tobacco: Not on file   Comment: Married, Housewife  . Alcohol Use:      Review of Systems  Constitutional: Positive for fatigue. Negative for fever and unexpected weight change.  Respiratory: Positive for shortness of breath (mild, working with pulm). Negative for cough and wheezing.   Cardiovascular: Negative for chest pain, palpitations and leg swelling.  No other specific complaints in a complete review of systems (except as listed in HPI above).      Objective:   Physical Exam BP 110/72  Pulse 69  Temp(Src) 97 F (36.1 C) (Oral)  Wt 116 lb 12.8 oz (52.98 kg)  SpO2 97% Wt Readings from Last 3 Encounters:  06/18/11 116 lb 12.8 oz (52.98 kg)  06/16/11 116 lb 9.6 oz (52.889 kg)  02/03/11 118 lb 12.8 oz (53.887 kg)   Constitutional: She appears well-developed and well-nourished. No distress.  Cardiovascular: Normal rate, regular rhythm and normal heart sounds.  No murmur heard. No BLE edema. Pulmonary/Chest: Effort normal and breath sounds normal. No respiratory distress. She has no wheezes.  CV: RRR, no BLE edema Psychiatric: She has a normal mood and affect.  Her behavior is normal. Judgment and thought content normal.   Lab Results  Component Value Date   WBC 9.5 01/21/2010   HGB 12.6 01/21/2010   HCT 37.2 01/21/2010   PLT 177.0 01/21/2010   GLUCOSE 100* 03/01/2009   CHOL 195 01/14/2010   TRIG 123.0 01/14/2010   HDL 61.50 01/14/2010   LDLDIRECT 150.8 03/21/2009   LDLCALC 109* 01/14/2010   ALT 18 05/27/2009   AST 27 05/27/2009   NA 139 03/01/2009   K 3.5 03/01/2009   CL 104 03/01/2009   CREATININE 0.70 03/01/2009   BUN 11 03/01/2009   CO2 28 03/01/2009    TSH 1.50 01/14/2010   INR 1.0 RATIO 01/25/2008   Dg Chest 2 View  06/16/2011  *RADIOLOGY REPORT*  Clinical Data: Chest pain and shortness of breath.  History of atypical mycobacterial infection.  CHEST - 2 VIEW  Comparison: 08/04/2010 and 02/03/2010 radiographs.  Findings: The heart size and mediastinal contours are stable. There is stable chronic lung disease with scattered nodularity, biapical scarring and linear opacities in the middle lobe and lingula.  There is probable associated bronchiectasis.  No superimposed airspace disease, dominant mass or significant pleural effusion is seen.  The osseous structures appear unchanged.  IMPRESSION: Stable chronic lung disease.  No acute cardiopulmonary process.  Original Report Authenticated By: Gerrianne Scale, M.D.        Assessment & Plan:   AWV - v70.0 -Today patient counseled on age appropriate routine health concerns for screening and prevention, each reviewed and up to date or declined. Immunizations reviewed and up to date or declined. Labsordered & to be reviewed. Risk factors for depression reviewed and negative. Hearing function and visual acuity are intact. ADLs screened and addressed as needed. Functional ability and level of safety reviewed and appropriate. Education, counseling and referrals performed based on assessed risks today. Patient provided with a copy of personalized plan for preventive services.  Fatigue - mild dyspnea but recent CXR ok and O2/HD ok Check screening labs and follow up pulm as planned

## 2011-06-18 NOTE — Assessment & Plan Note (Signed)
Alt 50 qod with 75 qod S/p thyroidectomy in 1998 Check tsh now and adjust as needed Lab Results  Component Value Date   TSH 1.50 01/14/2010

## 2011-06-18 NOTE — Assessment & Plan Note (Signed)
On simva Check lipids

## 2011-06-18 NOTE — Patient Instructions (Signed)
It was good to see you today. Health Maintenance reviewed - refer to gynecology and for bone density. Test(s) ordered today. Your results will be called to you after review (48-72hours after test completion). If any changes need to be made, you will be notified at that time. Medications reviewed, no changes at this time. Please schedule followup in 6 months for thyroid check, call sooner if problems.

## 2011-06-18 NOTE — Telephone Encounter (Signed)
Inform pt of lab results. Pt states she is needing thyroid meds to go to [facility]. Let pt know will send to mail service.Marland KitchenMarland Kitchen1/31/13@3 :41pm/LMB

## 2011-06-19 ENCOUNTER — Telehealth: Payer: Self-pay | Admitting: *Deleted

## 2011-06-19 DIAGNOSIS — E039 Hypothyroidism, unspecified: Secondary | ICD-10-CM

## 2011-06-19 MED ORDER — LEVOTHYROXINE SODIUM 50 MCG PO TABS: 50.0000 ug | ORAL_TABLET | ORAL | Status: DC

## 2011-06-19 MED ORDER — LEVOTHYROXINE SODIUM 75 MCG PO TABS: 75.0000 ug | ORAL_TABLET | ORAL | Status: DC

## 2011-06-19 NOTE — Telephone Encounter (Signed)
Pt called and stated [facility] said it would be 2 weeks before they can ship thyroid medication. Pt is requesting med to be sent to [facility]. Inform pt ill send to local pharmacy.... 06/19/11@2 :44pm/LMB

## 2011-06-23 ENCOUNTER — Telehealth: Payer: Self-pay | Admitting: Internal Medicine

## 2011-06-23 NOTE — Telephone Encounter (Signed)
Notes Recorded by Waymon Budge, MD on 06/17/2011 at 8:06 AM CXR- no change in old, stable chronic scarring. No new process.   I spoke with patient about results and she verbalized understanding and had no questions

## 2011-07-08 DIAGNOSIS — R350 Frequency of micturition: Secondary | ICD-10-CM | POA: Diagnosis not present

## 2011-07-08 DIAGNOSIS — Z9189 Other specified personal risk factors, not elsewhere classified: Secondary | ICD-10-CM | POA: Diagnosis not present

## 2011-07-08 DIAGNOSIS — Z1151 Encounter for screening for human papillomavirus (HPV): Secondary | ICD-10-CM | POA: Diagnosis not present

## 2011-07-08 DIAGNOSIS — Z01419 Encounter for gynecological examination (general) (routine) without abnormal findings: Secondary | ICD-10-CM | POA: Diagnosis not present

## 2011-07-28 ENCOUNTER — Other Ambulatory Visit: Payer: Medicare Other

## 2011-08-03 ENCOUNTER — Inpatient Hospital Stay: Admission: RE | Admit: 2011-08-03 | Payer: Medicare Other | Source: Ambulatory Visit

## 2011-08-03 ENCOUNTER — Ambulatory Visit: Payer: Medicare Other | Admitting: Internal Medicine

## 2011-10-13 ENCOUNTER — Ambulatory Visit (INDEPENDENT_AMBULATORY_CARE_PROVIDER_SITE_OTHER): Payer: Medicare Other | Admitting: Internal Medicine

## 2011-10-13 ENCOUNTER — Ambulatory Visit (INDEPENDENT_AMBULATORY_CARE_PROVIDER_SITE_OTHER)
Admission: RE | Admit: 2011-10-13 | Discharge: 2011-10-13 | Disposition: A | Discharge status: Home / Self Care | Payer: Medicare Other | Source: Ambulatory Visit | Attending: Internal Medicine | Admitting: Internal Medicine

## 2011-10-13 ENCOUNTER — Encounter: Payer: Self-pay | Admitting: Internal Medicine

## 2011-10-13 ENCOUNTER — Other Ambulatory Visit (INDEPENDENT_AMBULATORY_CARE_PROVIDER_SITE_OTHER): Payer: Medicare Other

## 2011-10-13 VITALS — BP 110/68 | HR 79 | Ht 63.0 in | Wt 120.2 lb

## 2011-10-13 DIAGNOSIS — R042 Hemoptysis: Secondary | ICD-10-CM

## 2011-10-13 DIAGNOSIS — J984 Other disorders of lung: Secondary | ICD-10-CM | POA: Diagnosis not present

## 2011-10-13 DIAGNOSIS — J471 Bronchiectasis with (acute) exacerbation: Secondary | ICD-10-CM | POA: Diagnosis not present

## 2011-10-13 DIAGNOSIS — J479 Bronchiectasis, uncomplicated: Secondary | ICD-10-CM | POA: Diagnosis not present

## 2011-10-13 LAB — CBC WITH DIFFERENTIAL/PLATELET
Basophils Relative: 0.5 % (ref 0.0–3.0)
Eosinophils Absolute: 0.1 10*3/uL (ref 0.0–0.7)
Hemoglobin: 13.2 g/dL (ref 12.0–15.0)
Lymphocytes Relative: 50.1 % — ABNORMAL HIGH (ref 12.0–46.0)
MCHC: 33 g/dL (ref 30.0–36.0)
Neutro Abs: 3.4 10*3/uL (ref 1.4–7.7)
RBC: 4.19 Mil/uL (ref 3.87–5.11)

## 2011-10-13 NOTE — Progress Notes (Signed)
Patient ID: Marilyn Blake, female    DOB: 12-22-1940, 71 y.o.   MRN: [id number]  HPI 02/03/2011-71 year old female never smoker followed for bronchiectasis, history of MAIC bronchitis, allergic rhinitis. Last here 08/04/2010 Chest x-ray in September of 2011 had shown bilateral scarring, bronchiectasis in the mid zones bilaterally CXR 08/04/2010-stable bronchiectasis, scarring, micro-nodularities in the small area of cavitary change in the right upper lobe consistent with chronic Mycobacterium infection. Acute visit-2 days of chills or lays temperature 100.8 yesterday, sore throat and nasal congestion. Cough is productive of some yellow sputum. No GI complaint.  06/16/11- 71 year old female never smoker followed for bronchiectasis, history of MAIC bronchitis, allergic rhinitis, insomnia. Her breathing has been comfortable. Not much daily cough. More easily winded with activities of daily living over the last 2 weeks but nothing very specific. A few vague twinge pains high in the left upper chest without palpitation or swelling. She has had to have a lot of dental work this winter and has often been uncomfortable from that and her age-related arthritis complaints. As a result she is not sleeping as well. She has taken occasional diazepam, usually one half tablet. We discussed alternatives including a short half-life medication that would be out of her system in the morning.  10/13/11- 71 year old female never smoker followed for bronchiectasis, history of MAIC bronchitis, allergic rhinitis, insomnia. Gets more winded than usual with activity-states she is very active overall. Last weekend had episode of coughing up blood(rich red blood) and then stopped-no more episodes since(Had SOB with it as well)  CXR 06/23/11- 71 year old female never smoker followed for bronchiectasis, history of MAIC bronchitis, allergic rhinitis, insomnia. Gradually more short of breath without increased cough. Notices it   especially after a day of yard work Did cough up some red blood mixed in clear phlegm,  one time, a week or more ago. Some pain left upper anterior chest sounds pleuritic. Few night sweats. CXR- 07/13/11- reviewed images with her IMPRESSION:  Stable chronic lung disease. No acute cardiopulmonary process.  Original Report Authenticated By: Gerrianne Scale, M.D.   Review of Systems Constitutional:   No-   weight loss, night sweats, , chills, +fatigue, lassitude. HEENT:   No-  headaches, difficulty swallowing, tooth/dental problems, sore throat,       No-  sneezing, itching, ear ache, nasal congestion, post nasal drip,  CV:  No-   chest pain, orthopnea, PND, swelling in lower extremities, anasarca,dizziness, palpitations Resp: + shortness of breath with exertion or at rest.              No-   productive cough,  No non-productive cough,  + coughing up of blood.              No- change in color of mucus.  No- wheezing.   Skin: No-   rash or lesions. GI:  No-   heartburn, indigestion, abdominal pain, nausea, vomiting,  GU:  MS:  No-   joint pain or swelling. . Neuro- grossly normal to observation,   Psych:  No- change in mood or affect. No depression or anxiety.  No memory loss.  Objective:   Physical Exam General- Alert, Oriented, Affect-appropriate, Distress- none acute, petite lady Skin- rash-none, lesions- none, excoriation- none Lymphadenopathy- none Head- atraumatic            Eyes- Gross vision intact, PERRLA, conjunctivae clear secretions            Ears- Hearing, canals- normal  Nose- Clear, No-Septal dev, mucus, polyps, erosion, perforation             Throat- Mallampati II , mucosa clear- not red , drainage- none, tonsils- atrophic,+mild hoarseness Neck- flexible , trachea midline, no stridor , thyroid nl, carotid no bruit Chest - symmetrical excursion , unlabored           Heart/CV- RRR , no murmur , no gallop  , no rub, nl s1 s2                           - JVD-  none , edema- none, stasis changes- none, varices- none           Lung- quiet and clear, unlabored, wheeze- none, cough- none , dullness-none, rub- none           Chest wall-  Abd- Br/ Gen/ Rectal- Not done, not indicated Extrem- cyanosis- none, clubbing, none, atrophy- none, strength- nl Neuro- grossly intact to observation

## 2011-10-13 NOTE — Patient Instructions (Signed)
Order- CXR  Dx bronchiectasis, Hemoptysis NOS              CBC w/ diff

## 2011-10-14 DIAGNOSIS — R262 Difficulty in walking, not elsewhere classified: Secondary | ICD-10-CM | POA: Diagnosis not present

## 2011-10-14 DIAGNOSIS — M722 Plantar fascial fibromatosis: Secondary | ICD-10-CM | POA: Diagnosis not present

## 2011-10-14 DIAGNOSIS — M7989 Other specified soft tissue disorders: Secondary | ICD-10-CM | POA: Diagnosis not present

## 2011-10-17 NOTE — Assessment & Plan Note (Signed)
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

## 2011-10-17 NOTE — Assessment & Plan Note (Signed)
Hemoptysis probably relates to known bronchiectasis/MAIC.  Plan-chest x-ray

## 2011-11-23 DIAGNOSIS — H2510 Age-related nuclear cataract, unspecified eye: Secondary | ICD-10-CM | POA: Diagnosis not present

## 2011-11-23 DIAGNOSIS — H52229 Regular astigmatism, unspecified eye: Secondary | ICD-10-CM | POA: Diagnosis not present

## 2011-11-23 DIAGNOSIS — H5200 Hypermetropia, unspecified eye: Secondary | ICD-10-CM | POA: Diagnosis not present

## 2011-12-15 ENCOUNTER — Ambulatory Visit: Payer: Medicare Other | Admitting: Internal Medicine

## 2011-12-21 ENCOUNTER — Ambulatory Visit: Payer: Medicare Other | Admitting: Internal Medicine

## 2012-01-05 ENCOUNTER — Other Ambulatory Visit: Payer: Self-pay | Admitting: Internal Medicine

## 2012-01-05 DIAGNOSIS — Z1231 Encounter for screening mammogram for malignant neoplasm of breast: Secondary | ICD-10-CM

## 2012-01-12 ENCOUNTER — Ambulatory Visit (INDEPENDENT_AMBULATORY_CARE_PROVIDER_SITE_OTHER): Payer: Medicare Other | Admitting: Internal Medicine

## 2012-01-12 ENCOUNTER — Encounter: Payer: Self-pay | Admitting: Internal Medicine

## 2012-01-12 ENCOUNTER — Other Ambulatory Visit (INDEPENDENT_AMBULATORY_CARE_PROVIDER_SITE_OTHER): Payer: Medicare Other

## 2012-01-12 VITALS — BP 118/62 | HR 72 | Temp 97.0°F | Ht 63.0 in | Wt 120.0 lb

## 2012-01-12 DIAGNOSIS — E039 Hypothyroidism, unspecified: Secondary | ICD-10-CM | POA: Diagnosis not present

## 2012-01-12 DIAGNOSIS — E785 Hyperlipidemia, unspecified: Secondary | ICD-10-CM

## 2012-01-12 DIAGNOSIS — F411 Generalized anxiety disorder: Secondary | ICD-10-CM

## 2012-01-12 DIAGNOSIS — J479 Bronchiectasis, uncomplicated: Secondary | ICD-10-CM

## 2012-01-12 LAB — TSH: TSH: 5.94 u[IU]/mL — ABNORMAL HIGH (ref 0.35–5.50)

## 2012-01-12 MED ORDER — DIAZEPAM 5 MG PO TABS: 5.0000 mg | ORAL_TABLET | Freq: Every day | ORAL | Status: DC | PRN

## 2012-01-12 MED ORDER — SIMVASTATIN 40 MG PO TABS: 20.0000 mg | ORAL_TABLET | Freq: Every day | ORAL | Status: DC

## 2012-01-12 NOTE — Assessment & Plan Note (Signed)
known chronic bronchiectasis/MAC - no recent flares Follows with pulm for same The current medical regimen is effective;  continue present plan and medications.

## 2012-01-12 NOTE — Assessment & Plan Note (Signed)
On simva - last lipids reviewed The current medical regimen is effective;  continue present plan and medications.

## 2012-01-12 NOTE — Patient Instructions (Signed)
It was good to see you today. Test(s) ordered today. Your results will be called to you after review (48-72hours after test completion). If any changes need to be made, you will be notified at that time. Medications reviewed, no changes at this time. Please schedule followup in 6 months for annually wellness visit, call sooner if problems.

## 2012-01-12 NOTE — Progress Notes (Signed)
  Subjective:    Patient ID: Marilyn Blake, female    DOB: 09-13-1940, 71 y.o.   MRN: [id number]  HPI  Here for follow up - reviewed chronic medical issues -  bronchiectasis - follows with pulm for same - currently, breathing at baseline and no hemopytsis - no shortness of breath or fever, scant AM clear sputum and nasal discharge   GERD - follows with GI ([facility] for same) -  associated with generalized chroinc abdominal pain -  bentyl stopped 12/2009 due to not being filled by insurance > "high risk" med category   hypothyroid - reports compliance with ongoing medical treatment and no changes in medication dose or frequency. denies adverse side effects related to current therapy. no weight or bowel changes   dyslipidemia - reports compliance with ongoing medical treatment and no changes in medication dose or frequency. denies adverse side effects related to current therapy. no myalgias or new GI pain   chronic low BP - sbp 90-100 usual per pt - no presync/syncope, no chest pain    anxiety - uses Valium on as needed basis    Past Medical History  Diagnosis Date  . ALLERGIC RHINITIS   . ANXIETY   . BACTEREMIA, MYCOBACTERIUM AVIUM COMPLEX   . BRONCHIECTASIS   . COLONIC POLYPS, HX OF   . GERD   . GOUT   . HYPERLIPIDEMIA   . HYPOTHYROIDISM   . Irritable bowel syndrome   . OSTEOPENIA      Review of Systems  Constitutional: Positive for fatigue. Negative for fever and unexpected weight change.  Respiratory: Positive for shortness of breath (mild, working with pulm). Negative for cough and wheezing.   Cardiovascular: Negative for chest pain, palpitations and leg swelling.       Objective:   Physical Exam  BP 118/62  Pulse 72  Temp 97 F (36.1 C) (Oral)  Ht 5\' 3"  (1.6 m)  Wt 120 lb (54.432 kg)  BMI 21.26 kg/m2  SpO2 95% Wt Readings from Last 3 Encounters:  01/12/12 120 lb (54.432 kg)  10/13/11 120 lb 3.2 oz (54.522 kg)  06/18/11 116 lb 12.8 oz (52.98 kg)    Constitutional: She appears well-developed and well-nourished. No distress.  Cardiovascular: Normal rate, regular rhythm and normal heart sounds.  No murmur heard. No BLE edema. Pulmonary/Chest: Effort normal and breath sounds normal. No respiratory distress. She has no wheezes.  Psychiatric: She has a normal mood and affect. Her behavior is normal. Judgment and thought content normal.   Lab Results  Component Value Date   WBC 7.9 10/13/2011   HGB 13.2 10/13/2011   HCT 39.9 10/13/2011   PLT 178.0 10/13/2011   GLUCOSE 86 06/18/2011   CHOL 208* 06/18/2011   TRIG 93.0 06/18/2011   HDL 53.60 06/18/2011   LDLDIRECT 137.6 06/18/2011   LDLCALC 109* 01/14/2010   ALT 24 06/18/2011   AST 31 06/18/2011   NA 141 06/18/2011   K 5.1 06/18/2011   CL 104 06/18/2011   CREATININE 0.7 06/18/2011   BUN 19 06/18/2011   CO2 33* 06/18/2011   TSH 2.39 06/18/2011   INR 1.0 RATIO 01/25/2008        Assessment & Plan:  See problem list. Medications and labs reviewed today.

## 2012-01-12 NOTE — Assessment & Plan Note (Signed)
Alt 50 qod with 75 qod S/p thyroidectomy in 1998 Check tsh now and adjust as needed Lab Results  Component Value Date   TSH 2.39 06/18/2011

## 2012-01-12 NOTE — Assessment & Plan Note (Signed)
Chronic BZs - stable sx - refill today

## 2012-01-19 ENCOUNTER — Ambulatory Visit: Payer: Medicare Other

## 2012-02-11 ENCOUNTER — Ambulatory Visit
Admission: RE | Admit: 2012-02-11 | Discharge: 2012-02-11 | Disposition: A | Discharge status: Home / Self Care | Payer: Medicare Other | Source: Ambulatory Visit | Attending: Internal Medicine | Admitting: Internal Medicine

## 2012-02-11 DIAGNOSIS — Z1231 Encounter for screening mammogram for malignant neoplasm of breast: Secondary | ICD-10-CM | POA: Diagnosis not present

## 2012-02-15 ENCOUNTER — Encounter: Payer: Self-pay | Admitting: Internal Medicine

## 2012-02-15 ENCOUNTER — Ambulatory Visit (INDEPENDENT_AMBULATORY_CARE_PROVIDER_SITE_OTHER): Payer: Medicare Other | Admitting: Internal Medicine

## 2012-02-15 VITALS — BP 116/72 | HR 76 | Ht 63.0 in | Wt 119.8 lb

## 2012-02-15 DIAGNOSIS — J309 Allergic rhinitis, unspecified: Secondary | ICD-10-CM

## 2012-02-15 DIAGNOSIS — J479 Bronchiectasis, uncomplicated: Secondary | ICD-10-CM | POA: Diagnosis not present

## 2012-02-15 DIAGNOSIS — Z23 Encounter for immunization: Secondary | ICD-10-CM

## 2012-02-15 NOTE — Progress Notes (Signed)
Patient ID: Marilyn Blake, female    DOB: September 01, 1940, 71 y.o.   MRN: [id number]  HPI 02/03/2011-71 year old female never smoker followed for bronchiectasis, history of MAIC bronchitis, allergic rhinitis. Last here 08/04/2010 Chest x-ray in September of 2011 had shown bilateral scarring, bronchiectasis in the mid zones bilaterally CXR 08/04/2010-stable bronchiectasis, scarring, micro-nodularities in the small area of cavitary change in the right upper lobe consistent with chronic Mycobacterium infection. Acute visit-2 days of chills or lays temperature 100.8 yesterday, sore throat and nasal congestion. Cough is productive of some yellow sputum. No GI complaint.  06/16/11- 71 year old female never smoker followed for bronchiectasis, history of MAIC bronchitis, allergic rhinitis, insomnia. Her breathing has been comfortable. Not much daily cough. More easily winded with activities of daily living over the last 2 weeks but nothing very specific. A few vague twinge pains high in the left upper chest without palpitation or swelling. She has had to have a lot of dental work this winter and has often been uncomfortable from that and her age-related arthritis complaints. As a result she is not sleeping as well. She has taken occasional diazepam, usually one half tablet. We discussed alternatives including a short half-life medication that would be out of her system in the morning.  10/13/11- 71 year old female never smoker followed for bronchiectasis, history of MAIC bronchitis, allergic rhinitis, insomnia. Gets more winded than usual with activity-states she is very active overall. Last weekend had episode of coughing up blood(rich red blood) and then stopped-no more episodes since(Had SOB with it as well)  CXR 06/23/11- 71 year old female never smoker followed for bronchiectasis, history of MAIC bronchitis, allergic rhinitis, insomnia. Gradually more short of breath without increased cough. Notices it   especially after a day of yard work Did cough up some red blood mixed in clear phlegm,  one time, a week or more ago. Some pain left upper anterior chest sounds pleuritic. Few night sweats. CXR- 07/13/11- reviewed images with her IMPRESSION:  Stable chronic lung disease. No acute cardiopulmonary process.  Original Report Authenticated By: Gerrianne Scale, M.D.   02/15/12-  71 year old female never smoker followed for bronchiectasis, history of MAIC bronchitis, allergic rhinitis, insomnia.  Pt states breathing has improved since last visit. She has had no more hemoptysis since last visit. Cough is prod with minimal light green sputum.  Good summer. No blood since last year. Seasonal sneezing and blowing. Husband being treated for colon cancer.  Review of Systems- see HPI Constitutional:   No-   weight loss, night sweats, , chills, +fatigue, lassitude. HEENT:   No-  headaches, difficulty swallowing, tooth/dental problems, sore throat,       No-  sneezing, itching, ear ache, +nasal congestion, post nasal drip,  CV:  No-   chest pain, orthopnea, PND, swelling in lower extremities, anasarca,dizziness, palpitations Resp: + shortness of breath with exertion or at rest.              No-   productive cough,  No non-productive cough,  no- coughing up of blood.              No- change in color of mucus.  No- wheezing.   Skin: No-   rash or lesions. GI:  No-   heartburn, indigestion, abdominal pain, nausea, vomiting,  GU:  MS:  No-   joint pain or swelling. . Neuro- grossly normal to observation,   Psych:  No- change in mood or affect. No depression or anxiety.  No memory loss.  Objective:  Physical Exam General- Alert, Oriented, Affect-appropriate, Distress- none acute, petite lady Skin- rash-none, lesions- none, excoriation- none Lymphadenopathy- none Head- atraumatic            Eyes- Gross vision intact, PERRLA, conjunctivae clear secretions            Ears- Hearing, canals- normal             Nose- Clear, No-Septal dev, mucus, polyps, erosion, perforation             Throat- Mallampati II , mucosa clear- not red , drainage- none, tonsils- atrophic,+mild hoarseness Neck- flexible , trachea midline, no stridor , thyroid nl, carotid no bruit Chest - symmetrical excursion , unlabored           Heart/CV- RRR , no murmur , no gallop  , no rub, nl s1 s2                           - JVD- none , edema- none, stasis changes- none, varices- none           Lung- + few crackles upper right, unlabored, wheeze- none, cough- none , dullness-none, rub- none           Chest wall-  Abd- Br/ Gen/ Rectal- Not done, not indicated Extrem- cyanosis- none, clubbing, none, atrophy- none, strength- nl Neuro- grossly intact to observation

## 2012-02-15 NOTE — Patient Instructions (Addendum)
Flu vax  Please call if needed 

## 2012-02-23 ENCOUNTER — Encounter: Payer: Self-pay | Admitting: Internal Medicine

## 2012-02-23 NOTE — Assessment & Plan Note (Signed)
Good control now. Had hemoptysis associated with chronic bronchitis/bronchiectasis, now better controlled. Plan-flu vaccine. Followup chest x-ray in 6 months.

## 2012-03-09 DIAGNOSIS — H905 Unspecified sensorineural hearing loss: Secondary | ICD-10-CM | POA: Diagnosis not present

## 2012-03-09 DIAGNOSIS — H9209 Otalgia, unspecified ear: Secondary | ICD-10-CM | POA: Diagnosis not present

## 2012-03-16 ENCOUNTER — Telehealth: Payer: Self-pay | Admitting: Internal Medicine

## 2012-03-16 DIAGNOSIS — J309 Allergic rhinitis, unspecified: Secondary | ICD-10-CM

## 2012-03-16 NOTE — Telephone Encounter (Signed)
Called, spoke with pt.  States approx 2 weekends ago, she started having problems with her ears.  States last week she was seen by Dr. Emeline Darling at [facility].  States he said she had no fluid and did a hearing test.  Other than this, he didn't say much.  Reports he was "very vague."  Pt states yesterday her ears started to hurt again and feels like it is a "motion" problem. Reports it feels like there is "liquid in my ear" and feels "stopped like when climbing a mountain."  Pt states she called Dr. Ellyn Hack office, and he called in an ear drop.  She will go pick this up.  She would like to know if Dr. Maple Hudson thinks flonase will work as she has been given it in the past for ear problems.  Also, she would like to know his recs on another ENT dr.  She is aware he is out of office today and ok with this.  Dr. Maple Hudson, pls advise.  Thank you.  Allergies  Allergen Reactions  . Ethambutol Hcl     REACTION: stomach pain  . Moxifloxacin     REACTION: mild rash - causal??     Last OV with Dr. Maple Hudson 02/15/2012

## 2012-03-17 NOTE — Telephone Encounter (Signed)
Per CY-would try Flonase in nose 1-2 sprays in each nostril bid for eustachian tubes and suggest second opinion ENT from Dr Haroldine Laws.

## 2012-03-17 NOTE — Telephone Encounter (Signed)
Called and spoke with patient, informed her that Dr. Maple Hudson suggests trying flonase. Patient stated she picked up a generic flonase yersterday and will stick what she has.  Also informed her that Dr. Maple Hudson suggest ENT consult with Dr. Brion Aliment, understands that this referral has been placed and they will contact her to set up appt.  Nothing further needed at this time.

## 2012-03-24 DIAGNOSIS — J0100 Acute maxillary sinusitis, unspecified: Secondary | ICD-10-CM | POA: Diagnosis not present

## 2012-06-08 ENCOUNTER — Encounter: Payer: Self-pay | Admitting: Internal Medicine

## 2012-06-08 ENCOUNTER — Ambulatory Visit (INDEPENDENT_AMBULATORY_CARE_PROVIDER_SITE_OTHER): Payer: Medicare Other | Admitting: Internal Medicine

## 2012-06-08 ENCOUNTER — Ambulatory Visit (INDEPENDENT_AMBULATORY_CARE_PROVIDER_SITE_OTHER)
Admission: RE | Admit: 2012-06-08 | Discharge: 2012-06-08 | Disposition: A | Discharge status: Home / Self Care | Payer: No Typology Code available for payment source | Source: Ambulatory Visit | Attending: Internal Medicine | Admitting: Internal Medicine

## 2012-06-08 ENCOUNTER — Other Ambulatory Visit (INDEPENDENT_AMBULATORY_CARE_PROVIDER_SITE_OTHER): Payer: Medicare Other

## 2012-06-08 VITALS — BP 118/70 | HR 74 | Temp 98.2°F | Wt 118.0 lb

## 2012-06-08 DIAGNOSIS — R9431 Abnormal electrocardiogram [ECG] [EKG]: Secondary | ICD-10-CM | POA: Diagnosis not present

## 2012-06-08 DIAGNOSIS — R5381 Other malaise: Secondary | ICD-10-CM

## 2012-06-08 DIAGNOSIS — M549 Dorsalgia, unspecified: Secondary | ICD-10-CM | POA: Diagnosis not present

## 2012-06-08 DIAGNOSIS — IMO0002 Reserved for concepts with insufficient information to code with codable children: Secondary | ICD-10-CM | POA: Diagnosis not present

## 2012-06-08 DIAGNOSIS — R55 Syncope and collapse: Secondary | ICD-10-CM

## 2012-06-08 DIAGNOSIS — R5383 Other fatigue: Secondary | ICD-10-CM

## 2012-06-08 LAB — URINALYSIS, ROUTINE W REFLEX MICROSCOPIC
Bilirubin Urine: NEGATIVE
Hgb urine dipstick: NEGATIVE
Ketones, ur: NEGATIVE
Total Protein, Urine: NEGATIVE
Urine Glucose: NEGATIVE
Urobilinogen, UA: 0.2 (ref 0.0–1.0)

## 2012-06-08 LAB — BASIC METABOLIC PANEL
BUN: 18 mg/dL (ref 6–23)
CO2: 31 mEq/L (ref 19–32)
Calcium: 9.2 mg/dL (ref 8.4–10.5)
Chloride: 103 mEq/L (ref 96–112)
Creatinine, Ser: 0.8 mg/dL (ref 0.4–1.2)
Glucose, Bld: 95 mg/dL (ref 70–99)

## 2012-06-08 LAB — HEPATIC FUNCTION PANEL
AST: 27 U/L (ref 0–37)
Albumin: 3.9 g/dL (ref 3.5–5.2)
Alkaline Phosphatase: 81 U/L (ref 39–117)
Bilirubin, Direct: 0 mg/dL (ref 0.0–0.3)
Total Bilirubin: 0.5 mg/dL (ref 0.3–1.2)

## 2012-06-08 LAB — CBC WITH DIFFERENTIAL/PLATELET
Basophils Absolute: 0 10*3/uL (ref 0.0–0.1)
Basophils Relative: 0.4 % (ref 0.0–3.0)
Eosinophils Absolute: 0.1 10*3/uL (ref 0.0–0.7)
MCHC: 33.6 g/dL (ref 30.0–36.0)
MCV: 94.8 fl (ref 78.0–100.0)
Monocytes Absolute: 0.5 10*3/uL (ref 0.1–1.0)
Neutrophils Relative %: 46.5 % (ref 43.0–77.0)
RBC: 4.06 Mil/uL (ref 3.87–5.11)
RDW: 13.1 % (ref 11.5–14.6)

## 2012-06-08 LAB — TSH: TSH: 4.99 u[IU]/mL (ref 0.35–5.50)

## 2012-06-08 MED ORDER — DIAZEPAM 5 MG PO TABS: 5.0000 mg | ORAL_TABLET | Freq: Every day | ORAL | Status: DC | PRN

## 2012-06-08 NOTE — Patient Instructions (Signed)
It was good to see you today. We have reviewed your prior records including labs and tests today ECG today: no heart problems evident Test(s) ordered today -labs and xray. Your results will be released to MyChart (or called to you) after review, usually within 72hours after test completion. If any changes need to be made, you will be notified at that same time. we'll make referral for ultrasound of heart. Our office will contact you regarding appointment(s) once made. Medications reviewed and updated, no changes recommended at this time.  Please schedule followup in 6 weeks to review symptoms and results, call sooner if problems.

## 2012-06-08 NOTE — Progress Notes (Signed)
  Subjective:    Patient ID: Marilyn Blake, female    DOB: 1940-07-25, 72 y.o.   MRN: [id number]  HPI  See CC and PA student note Feeling fatigued and extreme exhaustion Reports unprovoked syncope x 2 events last week - "fell out" on floor and helped up by spouse Denies LOC or presyncope -  No chest pain or palpitations or evidence for dehydration denies med changes  Review of Systems     Objective:   Physical Exam BP 118/70  Pulse 74  Temp 98.2 F (36.8 C) (Oral)  Wt 118 lb (53.524 kg)  SpO2 95% Wt Readings from Last 3 Encounters:  06/08/12 118 lb (53.524 kg)  02/15/12 119 lb 12.8 oz (54.341 kg)  01/12/12 120 lb (54.432 kg)   Constitutional: She appears well-developed and well-nourished. No distress.  HENT: Head: Normocephalic and atraumatic. Ears: B TMs ok, no erythema or effusion; Nose: Nose normal. Mouth/Throat: Oropharynx is clear and moist. No oropharyngeal exudate.  Eyes: Conjunctivae and EOM are normal. Pupils are equal, round, and reactive to light. No scleral icterus.  Neck: Normal range of motion. Neck supple. No JVD present. No thyromegaly present.  Cardiovascular: Normal rate, regular rhythm and normal heart sounds.  No murmur heard. No BLE edema. Pulmonary/Chest: Effort normal and breath sounds normal. No respiratory distress. She has no wheezes.  Abdominal: Soft. Bowel sounds are normal. She exhibits no distension. There is no tenderness. no masses Musculoskeletal: Normal range of motion, no joint effusions. No gross deformities Neurological: She is alert and oriented to person, place, and time. No cranial nerve deficit. Coordination, speech, gait normal. Neg Romberg, normal rapid alt movements and F>N and H>S Skin: Skin is warm and dry. No rash noted. No erythema.  Psychiatric: She has a normal mood and affect. Her behavior is normal. Judgment and thought content normal.   Lab Results  Component Value Date   WBC 7.9 10/13/2011   HGB 13.2 10/13/2011   HCT 39.9  10/13/2011   PLT 178.0 10/13/2011   GLUCOSE 86 06/18/2011   CHOL 208* 06/18/2011   TRIG 93.0 06/18/2011   HDL 53.60 06/18/2011   LDLDIRECT 137.6 06/18/2011   LDLCALC 109* 01/14/2010   ALT 24 06/18/2011   AST 31 06/18/2011   NA 141 06/18/2011   K 5.1 06/18/2011   CL 104 06/18/2011   CREATININE 0.7 06/18/2011   BUN 19 06/18/2011   CO2 33* 06/18/2011   TSH 5.94* 01/12/2012   INR 1.0 RATIO 01/25/2008   ECG: anterior Q and anterior TWI, ?new since 03/14/09 (difficult to compare due to poor copy quality scanned into EMR)    Assessment & Plan:  Fatigue, extreme x 2 weeks Syncope x 2 last week - Back pain, intermittent since fall abnormal ECG  Plan:  check screening labs rule out metabolic problems HD stable, reviewed new and prior ECG: abnormal but no acute changes Refer for stress Echo Check DG to rule out compression fracture   Support offered as pt is managing husband at home, who ill with surgical abscess and met cancer, home IV abx ongoing. Consider emotional "stress" as cause of symptoms if other evaluation unremarkable - Pt agrees and will follow up with [place] on same, will call if worse or recurrent syncope before that time

## 2012-06-14 ENCOUNTER — Encounter: Payer: Self-pay | Admitting: Internal Medicine

## 2012-06-14 ENCOUNTER — Other Ambulatory Visit (HOSPITAL_COMMUNITY): Payer: Self-pay | Admitting: *Deleted

## 2012-06-14 ENCOUNTER — Ambulatory Visit (HOSPITAL_BASED_OUTPATIENT_CLINIC_OR_DEPARTMENT_OTHER): Payer: Medicare Other

## 2012-06-14 ENCOUNTER — Ambulatory Visit (HOSPITAL_COMMUNITY): Payer: Medicare Other | Attending: Internal Medicine | Admitting: Radiology

## 2012-06-14 DIAGNOSIS — R55 Syncope and collapse: Secondary | ICD-10-CM

## 2012-06-14 DIAGNOSIS — R9431 Abnormal electrocardiogram [ECG] [EKG]: Secondary | ICD-10-CM | POA: Diagnosis not present

## 2012-06-14 DIAGNOSIS — R0989 Other specified symptoms and signs involving the circulatory and respiratory systems: Secondary | ICD-10-CM

## 2012-06-14 DIAGNOSIS — R072 Precordial pain: Secondary | ICD-10-CM | POA: Diagnosis not present

## 2012-06-14 DIAGNOSIS — R0609 Other forms of dyspnea: Secondary | ICD-10-CM | POA: Diagnosis not present

## 2012-06-14 DIAGNOSIS — R5381 Other malaise: Secondary | ICD-10-CM | POA: Insufficient documentation

## 2012-06-14 DIAGNOSIS — R5383 Other fatigue: Secondary | ICD-10-CM | POA: Insufficient documentation

## 2012-06-14 DIAGNOSIS — R079 Chest pain, unspecified: Secondary | ICD-10-CM

## 2012-06-14 DIAGNOSIS — E785 Hyperlipidemia, unspecified: Secondary | ICD-10-CM | POA: Insufficient documentation

## 2012-06-14 MED ORDER — SODIUM CHLORIDE 0.9 % IV SOLN
30.0000 ug/kg | INTRAVENOUS | Status: DC
  Administered 2012-06-14: 30 ug/kg/min via INTRAVENOUS

## 2012-06-14 NOTE — Progress Notes (Signed)
Dobutamine Stress Echocardiogram performed.  

## 2012-06-15 ENCOUNTER — Telehealth: Payer: Self-pay | Admitting: Internal Medicine

## 2012-06-15 DIAGNOSIS — R55 Syncope and collapse: Secondary | ICD-10-CM

## 2012-06-15 DIAGNOSIS — R9431 Abnormal electrocardiogram [ECG] [EKG]: Secondary | ICD-10-CM

## 2012-06-15 DIAGNOSIS — R9439 Abnormal result of other cardiovascular function study: Secondary | ICD-10-CM

## 2012-06-15 NOTE — Telephone Encounter (Signed)
Notified pt with md response.../lmb 

## 2012-06-15 NOTE — Telephone Encounter (Signed)
Please call pt - her stress test indicates possible cardiac problem that needs further evaluation, especially given her symptoms of passing out as discussed at our OV - I have made refer to cards, [facility] will call re: this appt - Pt should go to ER if any recurrent symptoms of passing out or chest pain prior to this cardiac appt. thanks

## 2012-06-17 ENCOUNTER — Ambulatory Visit (INDEPENDENT_AMBULATORY_CARE_PROVIDER_SITE_OTHER): Payer: Medicare Other | Admitting: Cardiovascular Disease

## 2012-06-17 ENCOUNTER — Encounter: Payer: Self-pay | Admitting: Cardiovascular Disease

## 2012-06-17 ENCOUNTER — Encounter: Payer: Self-pay | Admitting: *Deleted

## 2012-06-17 ENCOUNTER — Other Ambulatory Visit: Payer: Self-pay | Admitting: *Deleted

## 2012-06-17 VITALS — BP 122/74 | HR 74 | Ht 63.0 in | Wt 117.0 lb

## 2012-06-17 DIAGNOSIS — R9439 Abnormal result of other cardiovascular function study: Secondary | ICD-10-CM | POA: Diagnosis not present

## 2012-06-17 DIAGNOSIS — R079 Chest pain, unspecified: Secondary | ICD-10-CM | POA: Diagnosis not present

## 2012-06-17 DIAGNOSIS — R55 Syncope and collapse: Secondary | ICD-10-CM

## 2012-06-17 LAB — BASIC METABOLIC PANEL
BUN: 19 mg/dL (ref 6–23)
CO2: 32 mEq/L (ref 19–32)
Chloride: 100 mEq/L (ref 96–112)
Glucose, Bld: 91 mg/dL (ref 70–99)
Potassium: 4.2 mEq/L (ref 3.5–5.1)
Sodium: 138 mEq/L (ref 135–145)

## 2012-06-17 LAB — CBC WITH DIFFERENTIAL/PLATELET
Basophils Absolute: 0 10*3/uL (ref 0.0–0.1)
HCT: 39.3 % (ref 36.0–46.0)
Hemoglobin: 13.3 g/dL (ref 12.0–15.0)
Lymphs Abs: 2.6 10*3/uL (ref 0.7–4.0)
MCV: 94.2 fl (ref 78.0–100.0)
Monocytes Absolute: 0.6 10*3/uL (ref 0.1–1.0)
Monocytes Relative: 8 % (ref 3.0–12.0)
Neutro Abs: 4.2 10*3/uL (ref 1.4–7.7)
Platelets: 187 10*3/uL (ref 150.0–400.0)
RDW: 12.9 % (ref 11.5–14.6)

## 2012-06-17 LAB — PROTIME-INR: Prothrombin Time: 11.7 s (ref 10.2–12.4)

## 2012-06-17 MED ORDER — NITROGLYCERIN 0.4 MG SL SUBL: 0.4000 mg | SUBLINGUAL_TABLET | SUBLINGUAL | Status: DC | PRN

## 2012-06-17 NOTE — Progress Notes (Signed)
History of Present Illness: 72 yo female with history of HLD, GERD, bronchiectasis, MAIC bronchitis followed by Dr. Maple Hudson referred for evaluation of fatigue, syncope and abnormal stress echo. Stress echo with EKG changes c/w ischemia. Her wall motion did not change with exercise. She tells me that she woke up to go to the restroom and had stomach pain and then passed out. She had been under stress that day because her husband has been diagnosed and treated for colorectal cancer. She has had no syncope since then. She notes discomfort in her chest which she has felt is secondary to GERD. She became upset last night after hearing her nephew has brain cancer and had chest pain with radiation into the left arm. No SOB. She has always been very active but has not been exercising lately due to her husbands illness and caring for him.   Primary Care Physician: Rene Paci  Last Lipid Profile:Lipid Panel     Component Value Date/Time   CHOL 208* 06/18/2011 1139   TRIG 93.0 06/18/2011 1139   HDL 53.60 06/18/2011 1139   CHOLHDL 4 06/18/2011 1139   VLDL 18.6 06/18/2011 1139   LDLCALC 109* 01/14/2010 1053     Past Medical History  Diagnosis Date  . ALLERGIC RHINITIS   . ANXIETY   . BACTEREMIA, MYCOBACTERIUM AVIUM COMPLEX   . BRONCHIECTASIS   . COLONIC POLYPS, HX OF   . GERD   . GOUT   . HYPERLIPIDEMIA   . HYPOTHYROIDISM   . Irritable bowel syndrome   . OSTEOPENIA     Past Surgical History  Procedure Date  . Thyroidectomy     Hurthele cell tumor  . Abdominal hysterectomy   . Bladder suspension   . Carpal tunnel release     Left wrist    Current Outpatient Prescriptions  Medication Sig Dispense Refill  . benzonatate (TESSALON) 100 MG capsule TAKE 1 TO 2 CAPSULES BY MOUTH 4 TIMES A DAY AS NEEDED FOR COUGH  30 capsule  0  . calcium carbonate (OS-CAL) 600 MG TABS Take 600 mg by mouth daily.       . cetirizine (ZYRTEC) 10 MG tablet Take 10 mg by mouth daily.        . diazepam (VALIUM)  5 MG tablet Take 1 tablet (5 mg total) by mouth daily as needed.  30 tablet  0  . diphenhydrAMINE (BENADRYL) 25 MG tablet Take 25 mg by mouth daily as needed.       Marland Kitchen levothyroxine (SYNTHROID, LEVOTHROID) 50 MCG tablet Take 1 tablet (50 mcg total) by mouth every other day.  90 tablet  1  . levothyroxine (SYNTHROID, LEVOTHROID) 75 MCG tablet Take 1 tablet (75 mcg total) by mouth every other day. Alternate days with  90 tablet  1  . OMEGA 3 1000 MG CAPS Take by mouth daily.        Marland Kitchen omeprazole (PRILOSEC) 20 MG capsule Take 20 mg by mouth daily.        . Simethicone (GAS-X EXTRA STRENGTH) 125 MG CAPS Take by mouth 3 (three) times daily.        . simvastatin (ZOCOR) 40 MG tablet Take 40 mg by mouth at bedtime.       No current facility-administered medications for this visit.   Facility-Administered Medications Ordered in Other Visits  Medication Dose Route Frequency Provider Last Rate Last Dose  . DOBUTamine (DOBUTREX) 1,000 mcg/mL in sodium chloride 0.9 % 150 mL infusion  30 mcg/kg Intravenous  Continuous Newt Lukes, MD 96.3 mL/hr at 06/14/12 1530 30 mcg/kg/min at 06/14/12 1530    Allergies  Allergen Reactions  . Avelox (Moxifloxacin Hcl In Nacl)   . Ethambutol Hcl     REACTION: stomach pain  . Moxifloxacin     REACTION: mild rash - causal??    History   Social History  . Marital Status: Married    Spouse Name: N/A    Number of Children: 3  . Years of Education: N/A   Occupational History  . Retired    Social History Main Topics  . Smoking status: Never Smoker   . Smokeless tobacco: Not on file     Comment: Married, Housewife  . Alcohol Use: No  . Drug Use: No  . Sexually Active: Not on file   Other Topics Concern  . Not on file   Social History Narrative  . No narrative on file    Family History  Problem Relation Age of Onset  . Cancer Mother 3    cervical cancer, died age 88  . Heart attack Father 26    Review of Systems:  As stated in the HPI  and otherwise negative.   BP 122/74  Pulse 74  Ht 5\' 3"  (1.6 m)  Wt 117 lb (53.071 kg)  BMI 20.73 kg/m2  Physical Examination: General: Well developed, well nourished, NAD HEENT: OP clear, mucus membranes moist SKIN: warm, dry. No rashes. Neuro: No focal deficits Musculoskeletal: Muscle strength 5/5 all ext Psychiatric: Mood and affect normal Neck: No JVD, no carotid bruits, no thyromegaly, no lymphadenopathy. Lungs:Clear bilaterally, no wheezes, rhonci, crackles Cardiovascular: Regular rate and rhythm. No murmurs, gallops or rubs. Abdomen:Soft. Bowel sounds present. Non-tender.  Extremities: No lower extremity edema. Pulses are 2 + in the bilateral DP/PT.  EKG: 1/221/14: NSR, T wave inversions. Poor R wave progression precordial leads.   Stress echo 06/14/12: - Stress ECG conclusions: There were no stress arrhythmias or conduction abnormalities. The stress ECG was consistent with myocardial ischemia. - Staged echo: There was no echocardiographic evidence for stress-induced ischemia. - Impressions: ECG had T wave inversions in recovery. Echo images at low and high dose are normal Impressions:  - ECG had T wave inversions in recovery. Echo images at low and high dose are normal  Assessment and Plan:   1. Chest pain: Abnormal stress echo. This is concerning for unstable angina. Will arrange left heart cath to exclude obstructive CAD. Risks and benefits of the procedure have been reviewed with the patient. Will arrange for 06/30/12 in the outpatient cath lab. Pre-cath labs today.   2. Syncope: Will arrange carotid artery dopplers. Cath as above.

## 2012-06-17 NOTE — Patient Instructions (Signed)
Your physician recommends that you schedule a follow-up appointment in: 6 weeks  Your physician has requested that you have a carotid duplex. This test is an ultrasound of the carotid arteries in your neck. It looks at blood flow through these arteries that supply the brain with blood. Allow one hour for this exam. There are no restrictions or special instructions.   Your physician has requested that you have a cardiac catheterization. Cardiac catheterization is used to diagnose and/or treat various heart conditions. Doctors may recommend this procedure for a number of different reasons. The most common reason is to evaluate chest pain. Chest pain can be a symptom of coronary artery disease (CAD), and cardiac catheterization can show whether plaque is narrowing or blocking your heart's arteries. This procedure is also used to evaluate the valves, as well as measure the blood flow and oxygen levels in different parts of your heart. For further information please visit [email or web address] Please follow instruction sheet, as given. Scheduled for June 30, 2012    Coronary Angiography Coronary angiography is an X-ray procedure used to look at the arteries in the heart. In this procedure, a dye is injected through a long, hollow tube (catheter). The catheter is about the size of a piece of cooked spaghetti. The catheter injects a dye into an artery in your groin. X-rays are then taken to show if there is a blockage in the arteries of your heart. BEFORE THE PROCEDURE   Let your caregiver know if you have allergies to shellfish or contrast dye. Also let your caregiver know if you have kidney problems or failure.  Do not eat or drink starting from midnight up to the time of the procedure, or as directed.  You may drink enough water to take your medications the morning of the procedure if you were instructed to do so.  You should be at the hospital or outpatient facility where the procedure is to be  done 60 minutes prior to the procedure or as directed. PROCEDURE  You may be given an IV medication to help you relax before the procedure.  You will be prepared for the procedure by washing and shaving the area where the catheter will be inserted. This is usually done in the groin but may be done in the fold of your arm by your elbow.  A medicine will be given to numb your groin where the catheter will be inserted.  A specially trained doctor will insert the catheter into an artery in your groin. The catheter is guided by using a special type of X-ray (fluoroscopy) to the blood vessel being examined.  A special dye is then injected into the catheter and X-rays are taken. The dye helps to show where any narrowing or blockages are located in the heart arteries. AFTER THE PROCEDURE   After the procedure you will be kept in bed lying flat for several hours. You will be instructed to not bend or cross your legs.  The groin insertion site will be watched and checked frequently.  The pulse in your feet will be checked frequently.  Additional blood tests, X-rays and an EKG may be done.  You may stay in the hospital overnight for observation. SEEK IMMEDIATE MEDICAL CARE IF:   You develop chest pain, shortness of breath, feel faint, or pass out.  There is bleeding, swelling, or drainage from the catheter insertion site.  You develop pain, discoloration, coldness, or severe bruising in the leg or area where the catheter was inserted.  You have a fever. Document Released: 11/08/2002 Document Revised: 07/27/2011 Document Reviewed: 12/28/2007 [facility] Patient Information 2013 [place], [place].

## 2012-06-22 ENCOUNTER — Encounter (INDEPENDENT_AMBULATORY_CARE_PROVIDER_SITE_OTHER): Payer: Medicare Other

## 2012-06-22 DIAGNOSIS — R55 Syncope and collapse: Secondary | ICD-10-CM

## 2012-06-30 ENCOUNTER — Encounter (HOSPITAL_BASED_OUTPATIENT_CLINIC_OR_DEPARTMENT_OTHER)
Admission: RE | Discharge status: Absent without leave | Payer: Self-pay | Source: Ambulatory Visit | Attending: Cardiovascular Disease

## 2012-06-30 SURGERY — JV LEFT HEART CATHETERIZATION WITH CORONARY ANGIOGRAM

## 2012-07-04 ENCOUNTER — Other Ambulatory Visit: Payer: Self-pay | Admitting: Internal Medicine

## 2012-07-05 ENCOUNTER — Telehealth: Payer: Self-pay | Admitting: *Deleted

## 2012-07-05 DIAGNOSIS — R079 Chest pain, unspecified: Secondary | ICD-10-CM

## 2012-07-05 NOTE — Telephone Encounter (Signed)
Spoke with pt. Cath scheduled for Feb. 13, 2014 was cancelled due to weather. Rescheduled for Feb. 21. 2014 at 9:30 in [facility] lab. Pt aware to follow previous instruction sheet except for date and time change.  She will come to office tomorrow for lab work.

## 2012-07-06 ENCOUNTER — Other Ambulatory Visit (INDEPENDENT_AMBULATORY_CARE_PROVIDER_SITE_OTHER): Payer: Medicare Other

## 2012-07-06 DIAGNOSIS — R079 Chest pain, unspecified: Secondary | ICD-10-CM | POA: Diagnosis not present

## 2012-07-06 LAB — CBC WITH DIFFERENTIAL/PLATELET
Basophils Relative: 0.6 % (ref 0.0–3.0)
Eosinophils Relative: 1.4 % (ref 0.0–5.0)
HCT: 37.7 % (ref 36.0–46.0)
Hemoglobin: 12.8 g/dL (ref 12.0–15.0)
Lymphs Abs: 2.4 10*3/uL (ref 0.7–4.0)
MCV: 94 fl (ref 78.0–100.0)
Monocytes Absolute: 0.5 10*3/uL (ref 0.1–1.0)
Neutro Abs: 3.8 10*3/uL (ref 1.4–7.7)
Neutrophils Relative %: 55.9 % (ref 43.0–77.0)
RBC: 4.01 Mil/uL (ref 3.87–5.11)
WBC: 6.9 10*3/uL (ref 4.5–10.5)

## 2012-07-06 LAB — PROTIME-INR
INR: 1.1 ratio — ABNORMAL HIGH (ref 0.8–1.0)
Prothrombin Time: 11.7 s (ref 10.2–12.4)

## 2012-07-06 LAB — BASIC METABOLIC PANEL
CO2: 29 mEq/L (ref 19–32)
Chloride: 103 mEq/L (ref 96–112)
Potassium: 4 mEq/L (ref 3.5–5.1)
Sodium: 140 mEq/L (ref 135–145)

## 2012-07-08 ENCOUNTER — Encounter (HOSPITAL_BASED_OUTPATIENT_CLINIC_OR_DEPARTMENT_OTHER)
Admission: RE | Disposition: A | Discharge status: Home / Self Care | Payer: Self-pay | Source: Ambulatory Visit | Attending: Cardiovascular Disease

## 2012-07-08 ENCOUNTER — Inpatient Hospital Stay (HOSPITAL_BASED_OUTPATIENT_CLINIC_OR_DEPARTMENT_OTHER)
Admission: RE | Admit: 2012-07-08 | Discharge: 2012-07-08 | Disposition: A | Discharge status: Home / Self Care | Payer: Medicare Other | Source: Ambulatory Visit | Attending: Cardiovascular Disease | Admitting: Cardiovascular Disease

## 2012-07-08 DIAGNOSIS — E785 Hyperlipidemia, unspecified: Secondary | ICD-10-CM | POA: Diagnosis not present

## 2012-07-08 DIAGNOSIS — R079 Chest pain, unspecified: Secondary | ICD-10-CM | POA: Diagnosis not present

## 2012-07-08 DIAGNOSIS — K219 Gastro-esophageal reflux disease without esophagitis: Secondary | ICD-10-CM | POA: Insufficient documentation

## 2012-07-08 DIAGNOSIS — R9389 Abnormal findings on diagnostic imaging of other specified body structures: Secondary | ICD-10-CM | POA: Insufficient documentation

## 2012-07-08 DIAGNOSIS — R9439 Abnormal result of other cardiovascular function study: Secondary | ICD-10-CM

## 2012-07-08 SURGERY — JV LEFT HEART CATHETERIZATION WITH CORONARY ANGIOGRAM

## 2012-07-08 MED ORDER — DIAZEPAM 5 MG PO TABS
5.0000 mg | ORAL_TABLET | ORAL | Status: AC
  Administered 2012-07-08: 5 mg via ORAL

## 2012-07-08 MED ORDER — SODIUM CHLORIDE 0.9 % IJ SOLN: 3.0000 mL | Freq: Two times a day (BID) | INTRAMUSCULAR | Status: DC

## 2012-07-08 MED ORDER — SODIUM CHLORIDE 0.9 % IJ SOLN: 3.0000 mL | INTRAMUSCULAR | Status: DC | PRN

## 2012-07-08 MED ORDER — ACETAMINOPHEN 325 MG PO TABS: 650.0000 mg | ORAL_TABLET | ORAL | Status: DC | PRN

## 2012-07-08 MED ORDER — SODIUM CHLORIDE 0.9 % IV SOLN
INTRAVENOUS | Status: AC
  Administered 2012-07-08: 09:00:00 via INTRAVENOUS

## 2012-07-08 MED ORDER — ASPIRIN 81 MG PO CHEW
324.0000 mg | CHEWABLE_TABLET | ORAL | Status: AC
  Administered 2012-07-08: 324 mg via ORAL

## 2012-07-08 MED ORDER — SODIUM CHLORIDE 0.9 % IV SOLN
INTRAVENOUS | Status: DC
  Administered 2012-07-08: 09:00:00 via INTRAVENOUS

## 2012-07-08 MED ORDER — SODIUM CHLORIDE 0.9 % IV SOLN
250.0000 mL | INTRAVENOUS | Status: DC | PRN
Start: 2012-07-08 — End: 2012-07-08

## 2012-07-08 MED ORDER — ONDANSETRON HCL 4 MG/2ML IJ SOLN: 4.0000 mg | Freq: Four times a day (QID) | INTRAMUSCULAR | Status: DC | PRN

## 2012-07-08 NOTE — OR Nursing (Signed)
Meal served 

## 2012-07-08 NOTE — H&P (View-Only) (Signed)
 History of Present Illness: 72 yo female with history of HLD, GERD, bronchiectasis, MAIC bronchitis followed by Dr. Young referred for evaluation of fatigue, syncope and abnormal stress echo. Stress echo with EKG changes c/w ischemia. Her wall motion did not change with exercise. She tells me that she woke up to go to the restroom and had stomach pain and then passed out. She had been under stress that day because her husband has been diagnosed and treated for colorectal cancer. She has had no syncope since then. She notes discomfort in her chest which she has felt is secondary to GERD. She became upset last night after hearing her nephew has brain cancer and had chest pain with radiation into the left arm. No SOB. She has always been very active but has not been exercising lately due to her husbands illness and caring for him.   Primary Care Physician: Valerie Leschber  Last Lipid Profile:Lipid Panel     Component Value Date/Time   CHOL 208* 06/18/2011 1139   TRIG 93.0 06/18/2011 1139   HDL 53.60 06/18/2011 1139   CHOLHDL 4 06/18/2011 1139   VLDL 18.6 06/18/2011 1139   LDLCALC 109* 01/14/2010 1053     Past Medical History  Diagnosis Date  . ALLERGIC RHINITIS   . ANXIETY   . BACTEREMIA, MYCOBACTERIUM AVIUM COMPLEX   . BRONCHIECTASIS   . COLONIC POLYPS, HX OF   . GERD   . GOUT   . HYPERLIPIDEMIA   . HYPOTHYROIDISM   . Irritable bowel syndrome   . OSTEOPENIA     Past Surgical History  Procedure Date  . Thyroidectomy     Hurthele cell tumor  . Abdominal hysterectomy   . Bladder suspension   . Carpal tunnel release     Left wrist    Current Outpatient Prescriptions  Medication Sig Dispense Refill  . benzonatate (TESSALON) 100 MG capsule TAKE 1 TO 2 CAPSULES BY MOUTH 4 TIMES A DAY AS NEEDED FOR COUGH  30 capsule  0  . calcium carbonate (OS-CAL) 600 MG TABS Take 600 mg by mouth daily.       . cetirizine (ZYRTEC) 10 MG tablet Take 10 mg by mouth daily.        . diazepam (VALIUM)  5 MG tablet Take 1 tablet (5 mg total) by mouth daily as needed.  30 tablet  0  . diphenhydrAMINE (BENADRYL) 25 MG tablet Take 25 mg by mouth daily as needed.       . levothyroxine (SYNTHROID, LEVOTHROID) 50 MCG tablet Take 1 tablet (50 mcg total) by mouth every other day.  90 tablet  1  . levothyroxine (SYNTHROID, LEVOTHROID) 75 MCG tablet Take 1 tablet (75 mcg total) by mouth every other day. Alternate days with 50mcg  90 tablet  1  . OMEGA 3 1000 MG CAPS Take by mouth daily.        . omeprazole (PRILOSEC) 20 MG capsule Take 20 mg by mouth daily.        . Simethicone (GAS-X EXTRA STRENGTH) 125 MG CAPS Take by mouth 3 (three) times daily.        . simvastatin (ZOCOR) 40 MG tablet Take 40 mg by mouth at bedtime.       No current facility-administered medications for this visit.   Facility-Administered Medications Ordered in Other Visits  Medication Dose Route Frequency Provider Last Rate Last Dose  . DOBUTamine (DOBUTREX) 1,000 mcg/mL in sodium chloride 0.9 % 150 mL infusion  30 mcg/kg Intravenous   Continuous Valerie A Leschber, MD 96.3 mL/hr at 06/14/12 1530 30 mcg/kg/min at 06/14/12 1530    Allergies  Allergen Reactions  . Avelox (Moxifloxacin Hcl In Nacl)   . Ethambutol Hcl     REACTION: stomach pain  . Moxifloxacin     REACTION: mild rash - causal??    History   Social History  . Marital Status: Married    Spouse Name: N/A    Number of Children: 3  . Years of Education: N/A   Occupational History  . Retired    Social History Main Topics  . Smoking status: Never Smoker   . Smokeless tobacco: Not on file     Comment: Married, Housewife  . Alcohol Use: No  . Drug Use: No  . Sexually Active: Not on file   Other Topics Concern  . Not on file   Social History Narrative  . No narrative on file    Family History  Problem Relation Age of Onset  . Cancer Mother 84    cervical cancer, died age 90  . Heart attack Father 80    Review of Systems:  As stated in the HPI  and otherwise negative.   BP 122/74  Pulse 74  Ht 5' 3" (1.6 m)  Wt 117 lb (53.071 kg)  BMI 20.73 kg/m2  Physical Examination: General: Well developed, well nourished, NAD HEENT: OP clear, mucus membranes moist SKIN: warm, dry. No rashes. Neuro: No focal deficits Musculoskeletal: Muscle strength 5/5 all ext Psychiatric: Mood and affect normal Neck: No JVD, no carotid bruits, no thyromegaly, no lymphadenopathy. Lungs:Clear bilaterally, no wheezes, rhonci, crackles Cardiovascular: Regular rate and rhythm. No murmurs, gallops or rubs. Abdomen:Soft. Bowel sounds present. Non-tender.  Extremities: No lower extremity edema. Pulses are 2 + in the bilateral DP/PT.  EKG: 1/221/14: NSR, T wave inversions. Poor R wave progression precordial leads.   Stress echo 06/14/12: - Stress ECG conclusions: There were no stress arrhythmias or conduction abnormalities. The stress ECG was consistent with myocardial ischemia. - Staged echo: There was no echocardiographic evidence for stress-induced ischemia. - Impressions: ECG had T wave inversions in recovery. Echo images at low and high dose are normal Impressions:  - ECG had T wave inversions in recovery. Echo images at low and high dose are normal  Assessment and Plan:   1. Chest pain: Abnormal stress echo. This is concerning for unstable angina. Will arrange left heart cath to exclude obstructive CAD. Risks and benefits of the procedure have been reviewed with the patient. Will arrange for 06/30/12 in the outpatient cath lab. Pre-cath labs today.   2. Syncope: Will arrange carotid artery dopplers. Cath as above.  

## 2012-07-08 NOTE — Progress Notes (Signed)
Bedrest begins @ 1040, tegaderm dressing applied to right groin site by [employer].

## 2012-07-08 NOTE — Progress Notes (Signed)
Allen's test perforned on right hand with positive results, spo2 94%.

## 2012-07-08 NOTE — CV Procedure (Signed)
   Cardiac Catheterization Operative Report  Marilyn Blake [id number] 2/21/201410:23 AM Rene Paci, MD  Procedure Performed:  1. Left Heart Catheterization 2. Selective Coronary Angiography 3. Left ventricular angiogram  Operator: Verne Carrow, MD  Indication:  72 yo female with history of HLD, GERD, bronchiectasis, MAIC bronchitis followed by Dr. Maple Hudson referred for evaluation of fatigue, syncope and abnormal stress echo. Stress echo with EKG changes c/w ischemia. Her wall motion did not change with exercise. She tells me that she woke up to go to the restroom and had stomach pain and then passed out. She had been under stress that day because her husband has been diagnosed and treated for colorectal cancer. She has had no syncope since then. She notes discomfort in her chest which she has felt is secondary to GERD. She became upset recently after hearing her nephew has brain cancer and had chest pain with radiation into the left arm. No SOB. She has always been very active but has not been exercising lately due to her husbands illness and caring for him. Cardiac cath to exclude obstructive CAD.                                       Procedure Details: The risks, benefits, complications, treatment options, and expected outcomes were discussed with the patient. The patient and/or family concurred with the proposed plan, giving informed consent. The patient was brought to the outpatient cath lab after IV hydration was begun and oral premedication was given. The patient was further sedated with Versed and Fentanyl. The right groin was prepped and draped in the usual manner. Using the modified Seldinger access technique, a 4 French sheath was placed in the right femoral artery. Standard diagnostic catheters were used to perform selective coronary angiography. A pigtail catheter was used to perform a left ventricular angiogram.  There were no immediate complications. The patient was taken  to the recovery area in stable condition.   Hemodynamic Findings: Central aortic pressure: 140/63 Left ventricular pressure: 132/11/10  Angiographic Findings:  Left main: No obstructive disease noted.   Left Anterior Descending Artery: Large caliber vessel that courses to the apex but does not reach the apex. Moderate caliber septal perforator branch. There are several very small caliber diagonal branches. No obstructive disease noted.   Circumflex Artery: Large caliber vessel with small caliber first obtuse marginal branch and large caliber second obtuse marginal branch. No obstructive disease noted.   Right Coronary Artery: Large caliber, dominant vessel with no obstructive disease noted.   Left Ventricular Angiogram: LVEF=55-60%.   Impression: 1. No angiographic evidence of CAD 2. Normal LV systolic function  Recommendations: No further ischemic workup.        Complications:  None. The patient tolerated the procedure well.

## 2012-07-08 NOTE — Interval H&P Note (Signed)
History and Physical Interval Note:  07/08/2012 9:58 AM  Marilyn Blake  has presented today for cardiac cath with the diagnosis of cp and abnormal stress echo.   The various methods of treatment have been discussed with the patient and family. After consideration of risks, benefits and other options for treatment, the patient has consented to  Procedure(s): [facility] LEFT HEART CATHETERIZATION WITH CORONARY ANGIOGRAM (N/A) as a surgical intervention .  The patient's history has been reviewed, patient examined, no change in status, stable for surgery.  I have reviewed the patient's chart and labs.  Questions were answered to the patient's satisfaction.     MCALHANY,CHRISTOPHER

## 2012-07-14 ENCOUNTER — Ambulatory Visit: Payer: Medicare Other | Admitting: Internal Medicine

## 2012-07-22 ENCOUNTER — Other Ambulatory Visit: Payer: Self-pay | Admitting: Internal Medicine

## 2012-07-27 ENCOUNTER — Ambulatory Visit: Payer: Medicare Other | Admitting: Cardiovascular Disease

## 2012-08-10 ENCOUNTER — Ambulatory Visit: Payer: Medicare Other | Admitting: Internal Medicine

## 2012-08-15 ENCOUNTER — Ambulatory Visit: Payer: Medicare Other | Admitting: Internal Medicine

## 2012-08-19 ENCOUNTER — Encounter: Payer: Self-pay | Admitting: Internal Medicine

## 2012-08-19 ENCOUNTER — Ambulatory Visit (INDEPENDENT_AMBULATORY_CARE_PROVIDER_SITE_OTHER): Payer: Medicare Other | Admitting: Internal Medicine

## 2012-08-19 VITALS — BP 100/62 | HR 81 | Temp 98.0°F | Wt 114.8 lb

## 2012-08-19 DIAGNOSIS — F411 Generalized anxiety disorder: Secondary | ICD-10-CM

## 2012-08-19 DIAGNOSIS — K589 Irritable bowel syndrome without diarrhea: Secondary | ICD-10-CM | POA: Diagnosis not present

## 2012-08-19 MED ORDER — DIAZEPAM 5 MG PO TABS: 5.0000 mg | ORAL_TABLET | Freq: Every day | ORAL | Status: DC | PRN

## 2012-08-19 MED ORDER — PAROXETINE HCL 10 MG PO TABS: 10.0000 mg | ORAL_TABLET | ORAL | Status: DC

## 2012-08-19 MED ORDER — DICYCLOMINE HCL 10 MG PO CAPS: 10.0000 mg | ORAL_CAPSULE | Freq: Three times a day (TID) | ORAL | Status: DC | PRN

## 2012-08-19 NOTE — Assessment & Plan Note (Signed)
Chronic BZs - increasing symptoms related to situational stressors: Support offered as pt is managing husband at home, who ill with surgical abscess and met cancer, home IV abx ongoing.  suspect emotional "stress" as cause of symptoms  Start Paxil 10mg  qd - we reviewed potential risk/benefit and possible side effects - pt understands and agrees to same  will follow up with [place] on same 4-6 weeks, will call if worse before that time Ok to continue valium prn - refill today

## 2012-08-19 NOTE — Progress Notes (Signed)
  Subjective:    Patient ID: Marilyn Blake, female    DOB: 20-Apr-1941, 72 y.o.   MRN: [id number]  HPI   Here for follow up - conitnued fatigued and extreme exhaustion Reports unprovoked syncope x 2 events last week - "fell out" on floor and helped up by spouse Stress test to eval same - abnormal prompting cath - 07/08/12 cath without CAD No recurrence  Increasing anxiety related to decline in spouse's health  Past Medical History  Diagnosis Date  . ALLERGIC RHINITIS   . ANXIETY   . BACTEREMIA, MYCOBACTERIUM AVIUM COMPLEX   . BRONCHIECTASIS   . COLONIC POLYPS, HX OF   . GERD   . GOUT   . HYPERLIPIDEMIA   . HYPOTHYROIDISM   . Irritable bowel syndrome   . OSTEOPENIA     Review of Systems  Constitutional: Positive for fatigue.  Neurological: Negative for dizziness, syncope, weakness and headaches.  Psychiatric/Behavioral: Positive for sleep disturbance and dysphoric mood. Negative for self-injury.       Objective:   Physical Exam  BP 100/62  Pulse 81  Temp(Src) 98 F (36.7 C) (Oral)  Wt 114 lb 12.8 oz (52.073 kg)  BMI 20.34 kg/m2  SpO2 98% Wt Readings from Last 3 Encounters:  08/19/12 114 lb 12.8 oz (52.073 kg)  07/08/12 118 lb (53.524 kg)  07/08/12 118 lb (53.524 kg)   Constitutional: She appears well-developed and well-nourished. No distress.   Neck: Normal range of motion. Neck supple. No JVD present. No thyromegaly present.  Cardiovascular: Normal rate, regular rhythm and normal heart sounds.  No murmur heard. No BLE edema. Pulmonary/Chest: Effort normal and breath sounds normal. No respiratory distress. She has no wheezes. Psychiatric: She has a mildly anxious and appropriately dysphoric/occ tearful mood and affect. Her behavior is normal. Judgment and thought content normal.   Lab Results  Component Value Date   WBC 6.9 07/06/2012   HGB 12.8 07/06/2012   HCT 37.7 07/06/2012   PLT 181.0 07/06/2012   GLUCOSE 92 07/06/2012   CHOL 208* 06/18/2011   TRIG 93.0  06/18/2011   HDL 53.60 06/18/2011   LDLDIRECT 137.6 06/18/2011   LDLCALC 109* 01/14/2010   ALT 18 06/08/2012   AST 27 06/08/2012   NA 140 07/06/2012   K 4.0 07/06/2012   CL 103 07/06/2012   CREATININE 0.8 07/06/2012   BUN 24* 07/06/2012   CO2 29 07/06/2012   TSH 4.99 06/08/2012   INR 1.1* 07/06/2012       Assessment & Plan:   See problem list. Medications and labs reviewed today.

## 2012-08-19 NOTE — Patient Instructions (Signed)
It was good to see you today. We have reviewed your prior records including labs and tests today Start low-dose Paxil 10 mg once daily -  Okay to still use Valium as needed for anxiety and panic attack symptoms or insomnia Use Bentyl as needed for abdominal cramping Your prescription(s) have been submitted to your pharmacy. Please take as directed and contact our office if you believe you are having problem(s) with the medication(s). Followup in 6 weeks to review symptoms in medication, please call sooner if problems

## 2012-08-19 NOTE — Assessment & Plan Note (Signed)
Presenting symptoms with abdominal cramping related to emotional stress Start low-dose Paxil as for anxiety, see above bentyl as needed for abdominal cramping - rx done

## 2012-08-25 ENCOUNTER — Encounter: Payer: Medicare Other | Admitting: Cardiovascular Disease

## 2012-08-25 NOTE — Progress Notes (Signed)
No show for appt today. cdm 

## 2012-08-29 ENCOUNTER — Encounter: Payer: Self-pay | Admitting: Cardiovascular Disease

## 2012-09-16 ENCOUNTER — Telehealth: Payer: Self-pay | Admitting: Cardiovascular Disease

## 2012-09-16 NOTE — Telephone Encounter (Signed)
New Problem:    Patient called in in response to a no show letter she received and stated that she is doing well and would call back when she felt she needed to be seen.

## 2012-09-23 ENCOUNTER — Ambulatory Visit: Payer: Medicare Other | Admitting: Internal Medicine

## 2012-10-17 ENCOUNTER — Encounter: Payer: Self-pay | Admitting: Internal Medicine

## 2012-10-17 ENCOUNTER — Ambulatory Visit (INDEPENDENT_AMBULATORY_CARE_PROVIDER_SITE_OTHER): Payer: Medicare Other | Admitting: Internal Medicine

## 2012-10-17 VITALS — BP 110/62 | HR 78 | Temp 97.1°F | Wt 115.8 lb

## 2012-10-17 DIAGNOSIS — E785 Hyperlipidemia, unspecified: Secondary | ICD-10-CM | POA: Diagnosis not present

## 2012-10-17 DIAGNOSIS — F411 Generalized anxiety disorder: Secondary | ICD-10-CM | POA: Diagnosis not present

## 2012-10-17 MED ORDER — CITALOPRAM HYDROBROMIDE 10 MG PO TABS: 10.0000 mg | ORAL_TABLET | Freq: Every day | ORAL | Status: DC

## 2012-10-17 NOTE — Assessment & Plan Note (Signed)
Chronic BZs - increasing symptoms related to situational stressors: Support offered as pt is managing husband at home, who ill with surgical abscess and met cancer, home IV abx ongoing.  suspect emotional "stress" as cause of symptoms - cardiac workup negative 06/2012 for "spells" related to same Started Paxil 10mg  qd 08/2012 - intolerant of side effects and stopped after 2 weeks Would like to try citalopram as sister doing well with same Citalopram 10g qd - erx done - we reviewed potential risk/benefit and possible side effects - pt understands and agrees to same  will follow up with [place] on same 4-6 weeks, will call if worse before that time Ok to continue valium prn -

## 2012-10-17 NOTE — Progress Notes (Signed)
  Subjective:    Patient ID: Marilyn Blake, female    DOB: 05/21/1940, 72 y.o.   MRN: [id number]  HPI   Here for follow up - anxiety related to decline in spouse's health Intol of paxil due to feeling more depressed on treatment - stopped indep after 2 weeks ?citalopram - sister takes same Denies SI/HI  Past Medical History  Diagnosis Date  . ALLERGIC RHINITIS   . ANXIETY   . BACTEREMIA, MYCOBACTERIUM AVIUM COMPLEX   . BRONCHIECTASIS   . COLONIC POLYPS, HX OF   . GERD   . GOUT   . HYPERLIPIDEMIA   . HYPOTHYROIDISM   . Irritable bowel syndrome   . OSTEOPENIA     Review of Systems  Constitutional: Positive for fatigue.  Neurological: Negative for dizziness, syncope, weakness and headaches.  Psychiatric/Behavioral: Positive for sleep disturbance and dysphoric mood. Negative for self-injury.       Objective:   Physical Exam  BP 110/62  Pulse 78  Temp(Src) 97.1 F (36.2 C) (Oral)  Wt 115 lb 12.8 oz (52.527 kg)  BMI 20.52 kg/m2  SpO2 93% Wt Readings from Last 3 Encounters:  10/17/12 115 lb 12.8 oz (52.527 kg)  08/19/12 114 lb 12.8 oz (52.073 kg)  07/08/12 118 lb (53.524 kg)   Constitutional: She appears well-developed and well-nourished. No distress.   Neck: Normal range of motion. Neck supple. No JVD present. No thyromegaly present.  Cardiovascular: Normal rate, regular rhythm and normal heart sounds.  No murmur heard. No BLE edema. Pulmonary/Chest: Effort normal and breath sounds normal. No respiratory distress. She has no wheezes. Psychiatric: She has a mildly anxious mood and affect. Her behavior is normal. Judgment and thought content normal.   Lab Results  Component Value Date   WBC 6.9 07/06/2012   HGB 12.8 07/06/2012   HCT 37.7 07/06/2012   PLT 181.0 07/06/2012   GLUCOSE 92 07/06/2012   CHOL 208* 06/18/2011   TRIG 93.0 06/18/2011   HDL 53.60 06/18/2011   LDLDIRECT 137.6 06/18/2011   LDLCALC 109* 01/14/2010   ALT 18 06/08/2012   AST 27 06/08/2012   NA 140  07/06/2012   K 4.0 07/06/2012   CL 103 07/06/2012   CREATININE 0.8 07/06/2012   BUN 24* 07/06/2012   CO2 29 07/06/2012   TSH 4.99 06/08/2012   INR 1.1* 07/06/2012       Assessment & Plan:   See problem list. Medications and labs reviewed today.

## 2012-10-17 NOTE — Patient Instructions (Signed)
It was good to see you today. We have reviewed your prior records including labs and tests today Start low-dose citalopram 10 mg once daily - Your prescription(s) have been submitted to your pharmacy. Please take as directed and contact our office if you believe you are having problem(s) with the medication(s). Okay to still use Valium as needed for anxiety and panic attack symptoms or insomnia Test(s) ordered today. Return one morning when you're fasting for this cholesterol check. Your results will be released to MyChart (or called to you) after review, usually within 72hours after test completion. If any changes need to be made, you will be notified at that same time. Followup in 6 months to review symptoms, medications and to monitor thyroid labs; please call sooner if problems

## 2012-10-17 NOTE — Assessment & Plan Note (Signed)
On simva - last lipids reviewed, check annually The current medical regimen is effective;  continue present plan and medications.  

## 2012-10-18 ENCOUNTER — Ambulatory Visit: Payer: Medicare Other | Admitting: Internal Medicine

## 2012-10-31 ENCOUNTER — Inpatient Hospital Stay (HOSPITAL_BASED_OUTPATIENT_CLINIC_OR_DEPARTMENT_OTHER)
Admission: RE | Admit: 2012-10-31 | Discharge: 2012-10-31 | Discharge status: Against Medical Advice | Payer: Medicare Other | Source: Ambulatory Visit | Attending: Cardiovascular Disease | Admitting: Cardiovascular Disease

## 2012-11-05 ENCOUNTER — Other Ambulatory Visit: Payer: Self-pay | Admitting: Internal Medicine

## 2012-11-07 NOTE — Telephone Encounter (Signed)
Ok to refill 

## 2012-11-07 NOTE — Telephone Encounter (Signed)
Rx has been called in per CY. 

## 2012-11-07 NOTE — Telephone Encounter (Signed)
Please advise if okay to refill. Thanks.  

## 2012-11-15 ENCOUNTER — Emergency Department (HOSPITAL_COMMUNITY)
Admission: EM | Admit: 2012-11-15 | Discharge: 2012-11-15 | Disposition: A | Discharge status: Home / Self Care | Payer: Medicare Other | Attending: Emergency Medicine | Admitting: Emergency Medicine

## 2012-11-15 ENCOUNTER — Encounter (HOSPITAL_COMMUNITY): Payer: Self-pay | Admitting: Emergency Medicine

## 2012-11-15 ENCOUNTER — Emergency Department (HOSPITAL_COMMUNITY): Payer: Medicare Other

## 2012-11-15 DIAGNOSIS — E039 Hypothyroidism, unspecified: Secondary | ICD-10-CM | POA: Insufficient documentation

## 2012-11-15 DIAGNOSIS — Z860100 Personal history of colon polyps, unspecified: Secondary | ICD-10-CM | POA: Insufficient documentation

## 2012-11-15 DIAGNOSIS — R079 Chest pain, unspecified: Secondary | ICD-10-CM

## 2012-11-15 DIAGNOSIS — F411 Generalized anxiety disorder: Secondary | ICD-10-CM | POA: Diagnosis not present

## 2012-11-15 DIAGNOSIS — E785 Hyperlipidemia, unspecified: Secondary | ICD-10-CM | POA: Insufficient documentation

## 2012-11-15 DIAGNOSIS — Z8709 Personal history of other diseases of the respiratory system: Secondary | ICD-10-CM | POA: Diagnosis not present

## 2012-11-15 DIAGNOSIS — Z8619 Personal history of other infectious and parasitic diseases: Secondary | ICD-10-CM | POA: Insufficient documentation

## 2012-11-15 DIAGNOSIS — K219 Gastro-esophageal reflux disease without esophagitis: Secondary | ICD-10-CM | POA: Diagnosis not present

## 2012-11-15 DIAGNOSIS — Z862 Personal history of diseases of the blood and blood-forming organs and certain disorders involving the immune mechanism: Secondary | ICD-10-CM | POA: Diagnosis not present

## 2012-11-15 DIAGNOSIS — Z8639 Personal history of other endocrine, nutritional and metabolic disease: Secondary | ICD-10-CM | POA: Insufficient documentation

## 2012-11-15 DIAGNOSIS — M810 Age-related osteoporosis without current pathological fracture: Secondary | ICD-10-CM | POA: Diagnosis not present

## 2012-11-15 DIAGNOSIS — J479 Bronchiectasis, uncomplicated: Secondary | ICD-10-CM | POA: Diagnosis not present

## 2012-11-15 DIAGNOSIS — Z8719 Personal history of other diseases of the digestive system: Secondary | ICD-10-CM | POA: Insufficient documentation

## 2012-11-15 DIAGNOSIS — Z79899 Other long term (current) drug therapy: Secondary | ICD-10-CM | POA: Insufficient documentation

## 2012-11-15 LAB — CBC
HCT: 38.9 % (ref 36.0–46.0)
Hemoglobin: 13 g/dL (ref 12.0–15.0)
MCHC: 33.4 g/dL (ref 30.0–36.0)
RBC: 4.14 MIL/uL (ref 3.87–5.11)
WBC: 8.3 10*3/uL (ref 4.0–10.5)

## 2012-11-15 LAB — COMPREHENSIVE METABOLIC PANEL
ALT: 15 U/L (ref 0–35)
Alkaline Phosphatase: 97 U/L (ref 39–117)
BUN: 12 mg/dL (ref 6–23)
CO2: 27 mEq/L (ref 19–32)
Chloride: 101 mEq/L (ref 96–112)
GFR calc Af Amer: 79 mL/min — ABNORMAL LOW (ref >90)
Glucose, Bld: 100 mg/dL — ABNORMAL HIGH (ref 70–99)
Potassium: 3.8 mEq/L (ref 3.5–5.1)
Sodium: 139 mEq/L (ref 135–145)
Total Bilirubin: 0.3 mg/dL (ref 0.3–1.2)
Total Protein: 7.7 g/dL (ref 6.0–8.3)

## 2012-11-15 LAB — POCT I-STAT TROPONIN I: Troponin i, poc: 0 ng/mL (ref 0.00–0.08)

## 2012-11-15 MED ORDER — GI COCKTAIL ~~LOC~~
30.0000 mL | Freq: Once | ORAL | Status: AC
  Administered 2012-11-15: 30 mL via ORAL
  Filled 2012-11-15: qty 30

## 2012-11-15 MED ORDER — SODIUM CHLORIDE 0.9 % IV SOLN
1000.0000 mL | INTRAVENOUS | Status: DC
  Administered 2012-11-15: 1000 mL via INTRAVENOUS

## 2012-11-15 MED ORDER — PANTOPRAZOLE SODIUM 40 MG PO TBEC: 40.0000 mg | DELAYED_RELEASE_TABLET | Freq: Every day | ORAL | Status: DC

## 2012-11-15 MED ORDER — SUCRALFATE 1 G PO TABS: 1.0000 g | ORAL_TABLET | Freq: Four times a day (QID) | ORAL | Status: DC

## 2012-11-15 NOTE — ED Notes (Signed)
Pt states that she has been back and forth with husband to hospital and began having chest pain, took some relfux medicaiton did not help. Pt had a stress test done 2 months ago and it was ok.

## 2012-11-15 NOTE — ED Provider Notes (Signed)
History  CSN: [id number] Arrival date & time 11/15/12  1207 First MD Initiated Contact with Patient 11/15/12 1219     Chief Complaint  Patient presents with  . Chest Pain   HPI Pt was swallowing a pill yesterday and after that she started having pain in her chest.  She thinks the pill did not go down properly.  She tried taking ntg, gaviscon and other meds without relief.  No trouble with eating or swallowing. She can eat without difficulty.  She has had trouble with chest pain and had a normal stress test in the past few months.  No shortness of breath although sometimes she thinks it feels funny.  No leg swelling.    Past Medical History  Diagnosis Date  . ALLERGIC RHINITIS   . ANXIETY   . BACTEREMIA, MYCOBACTERIUM AVIUM COMPLEX   . BRONCHIECTASIS   . COLONIC POLYPS, HX OF   . GERD   . GOUT   . HYPERLIPIDEMIA   . HYPOTHYROIDISM   . Irritable bowel syndrome   . OSTEOPENIA    Past Surgical History  Procedure Laterality Date  . Thyroidectomy      Hurthele cell tumor  . Abdominal hysterectomy    . Bladder suspension    . Carpal tunnel release      Left wrist   Family History  Problem Relation Age of Onset  . Cancer Mother 40    cervical cancer, died age 63  . Heart attack Father 47   History  Substance Use Topics  . Smoking status: Never Smoker   . Smokeless tobacco: Not on file     Comment: Married, Housewife  . Alcohol Use: No   OB History   Grav Para Term Preterm Abortions TAB SAB Ect Mult Living                 Review of Systems  All other systems reviewed and are negative.    Allergies  Avelox; Ethambutol hcl; and Moxifloxacin  Home Medications   Current Outpatient Rx  Name  Route  Sig  Dispense  Refill  . benzonatate (TESSALON) 100 MG capsule      TAKE 1 TO 2 CAPSULES BY MOUTH 4 TIMES A DAY AS NEEDED FOR COUGH   30 capsule   0   . calcium carbonate (OS-CAL) 600 MG TABS   Oral   Take 600 mg by mouth daily.          . cetirizine (ZYRTEC)  10 MG tablet   Oral   Take 10 mg by mouth daily.           . citalopram (CELEXA) 10 MG tablet   Oral   Take 1 tablet (10 mg total) by mouth daily.   30 tablet   5   . diazepam (VALIUM) 5 MG tablet   Oral   Take 1 tablet (5 mg total) by mouth daily as needed.   30 tablet   0   . dicyclomine (BENTYL) 10 MG capsule   Oral   Take 1 capsule (10 mg total) by mouth 3 (three) times daily with meals as needed (for cramping).   40 capsule   1   . diphenhydrAMINE (BENADRYL) 25 MG tablet   Oral   Take 25 mg by mouth daily as needed.          Marland Kitchen levothyroxine (SYNTHROID, LEVOTHROID) 50 MCG tablet      TAKE ONE TABLET BY MOUTH EVERY OTHER DAY   90 tablet  1   . levothyroxine (SYNTHROID, LEVOTHROID) 75 MCG tablet      TAKE ONE TABLET BY MOUTH EVERY OTHER DAY-ALTERNATE WITH   90 tablet   0   . NITROSTAT 0.4 MG SL tablet      Take 1 as needed for chest pain         . OMEGA 3 1000 MG CAPS   Oral   Take by mouth daily.           . pantoprazole (PROTONIX) 40 MG tablet   Oral   Take 1 tablet (40 mg total) by mouth daily.   14 tablet   0   . Simethicone (GAS-X EXTRA STRENGTH) 125 MG CAPS   Oral   Take by mouth 3 (three) times daily.           . simvastatin (ZOCOR) 40 MG tablet      TAKE ONE-HALF TABLET BY MOUTH AT BEDTIME   45 tablet   1   . sucralfate (CARAFATE) 1 G tablet   Oral   Take 1 tablet (1 g total) by mouth 4 (four) times daily.   28 tablet   1   . zaleplon (SONATA) 5 MG capsule      TAKE AS DIRECTED ONCE OR TWICE FOR SLEEP IF NEEDED   30 capsule   5    BP 133/68  Pulse 80  Temp(Src) 97.6 F (36.4 C)  Resp 20  SpO2 100% Physical Exam  Nursing note and vitals reviewed. Constitutional: She appears well-developed and well-nourished. No distress.  HENT:  Head: Normocephalic and atraumatic.  Right Ear: External ear normal.  Left Ear: External ear normal.  Mouth/Throat: No oropharyngeal exudate.  Eyes: Conjunctivae are normal.  Right eye exhibits no discharge. Left eye exhibits no discharge. No scleral icterus.  Neck: Neck supple. No tracheal deviation present.  Cardiovascular: Normal rate, regular rhythm and intact distal pulses.   Pulmonary/Chest: Effort normal and breath sounds normal. No stridor. No respiratory distress. She has no wheezes. She has no rales.  Abdominal: Soft. Bowel sounds are normal. She exhibits no distension. There is no tenderness. There is no rebound and no guarding.  Musculoskeletal: She exhibits no edema and no tenderness.  Neurological: She is alert. She has normal strength. No sensory deficit. Cranial nerve deficit:  no gross defecits noted. She exhibits normal muscle tone. She displays no seizure activity. Coordination normal.  Skin: Skin is warm and dry. No rash noted.  Psychiatric: She has a normal mood and affect.    ED Course  Procedures (including critical care time)   Rate: 77  Rhythm: normal sinus rhythm  QRS Axis: normal  Intervals: normal  ST/T Wave abnormalities: normal  Conduction Disutrbances:none  Narrative Interpretation: nl  Old EKG Reviewed: none available  Labs Reviewed  COMPREHENSIVE METABOLIC PANEL - Abnormal; Notable for the following:    Glucose, Bld 100 (*)    GFR calc non Af Amer 68 (*)    GFR calc Af Amer 79 (*)    All other components within normal limits  CBC  PROTIME-INR  APTT  POCT I-STAT TROPONIN I   Dg Chest 2 View  11/15/2012   *RADIOLOGY REPORT*  Clinical Data: Chest pain, shortness of breath  CHEST - 2 VIEW  Comparison: 10/13/2011  Findings: Chronic interstitial markings with bronchiectasis and subpleural reticulation, most prominent in the right middle lobe and lingula, unchanged.  No definite superimposed opacities suspicious for pneumonia. No pleural effusion or pneumothorax.  The heart  is top normal in size.  Degenerative changes of the visualized thoracolumbar spine.  IMPRESSION: Chronic opacities with bronchiectasis, most prominent in the  right middle lobe and lingula, suspicious for atypical infection such as MAI.  No definite superimposed opacities suspicious for pneumonia.   Original Report Authenticated By: Charline Bills, M.D.   1. Chest pain   2. GERD (gastroesophageal reflux disease)     MDM  The patient's symptoms are likely related to esophagitis. Patient recently had a negative stress test. The symptoms all started after she tried swallowing the pill. We'll discharge the patient home on Protonix instead of her Prilosec. I will also add Carafate to her regimen. Patient understands followup with her primary doctor. She may benefit from GI consultation.  Celene Kras, MD 11/15/12 [phone]

## 2012-12-07 DIAGNOSIS — R1310 Dysphagia, unspecified: Secondary | ICD-10-CM | POA: Diagnosis not present

## 2012-12-07 DIAGNOSIS — K219 Gastro-esophageal reflux disease without esophagitis: Secondary | ICD-10-CM | POA: Diagnosis not present

## 2012-12-26 ENCOUNTER — Other Ambulatory Visit: Payer: Self-pay | Admitting: Internal Medicine

## 2012-12-26 ENCOUNTER — Other Ambulatory Visit: Payer: Self-pay | Admitting: *Deleted

## 2012-12-26 MED ORDER — LEVOTHYROXINE SODIUM 75 MCG PO TABS: ORAL_TABLET | ORAL | Status: DC

## 2012-12-29 ENCOUNTER — Other Ambulatory Visit: Payer: Self-pay | Admitting: Internal Medicine

## 2012-12-29 NOTE — Telephone Encounter (Signed)
MD out of office. Pls advise on refill.../lmb 

## 2013-01-04 ENCOUNTER — Other Ambulatory Visit: Payer: Self-pay | Admitting: Internal Medicine

## 2013-01-05 NOTE — Telephone Encounter (Signed)
Faxed script back to [facility].../lmb 

## 2013-01-18 ENCOUNTER — Encounter: Payer: Self-pay | Admitting: Internal Medicine

## 2013-01-18 ENCOUNTER — Other Ambulatory Visit (INDEPENDENT_AMBULATORY_CARE_PROVIDER_SITE_OTHER): Payer: Medicare Other

## 2013-01-18 ENCOUNTER — Ambulatory Visit (INDEPENDENT_AMBULATORY_CARE_PROVIDER_SITE_OTHER): Payer: Medicare Other | Admitting: Internal Medicine

## 2013-01-18 ENCOUNTER — Ambulatory Visit (INDEPENDENT_AMBULATORY_CARE_PROVIDER_SITE_OTHER)
Admission: RE | Admit: 2013-01-18 | Discharge: 2013-01-18 | Disposition: A | Discharge status: Home / Self Care | Payer: Medicare Other | Source: Ambulatory Visit | Attending: Internal Medicine | Admitting: Internal Medicine

## 2013-01-18 VITALS — BP 100/58 | HR 71 | Ht 63.0 in | Wt 117.0 lb

## 2013-01-18 DIAGNOSIS — E785 Hyperlipidemia, unspecified: Secondary | ICD-10-CM | POA: Diagnosis not present

## 2013-01-18 DIAGNOSIS — J42 Unspecified chronic bronchitis: Secondary | ICD-10-CM

## 2013-01-18 DIAGNOSIS — J479 Bronchiectasis, uncomplicated: Secondary | ICD-10-CM

## 2013-01-18 DIAGNOSIS — Z23 Encounter for immunization: Secondary | ICD-10-CM | POA: Diagnosis not present

## 2013-01-18 DIAGNOSIS — J449 Chronic obstructive pulmonary disease, unspecified: Secondary | ICD-10-CM | POA: Diagnosis not present

## 2013-01-18 LAB — LIPID PANEL: HDL: 48.3 mg/dL (ref >39.00)

## 2013-01-18 LAB — LDL CHOLESTEROL, DIRECT: Direct LDL: 121.7 mg/dL

## 2013-01-18 MED ORDER — AMOXICILLIN-POT CLAVULANATE 875-125 MG PO TABS: 1.0000 | ORAL_TABLET | Freq: Two times a day (BID) | ORAL | Status: DC

## 2013-01-18 NOTE — Patient Instructions (Addendum)
Script for augmentin antibiotic sent  Order- CXR- Dx chronic bronchitis acute exacerbation   Order- lab- sputum culture  Routine, fungal and AFB    Dx chronic bronchitis  Flu vax

## 2013-01-18 NOTE — Progress Notes (Signed)
Patient ID: Marilyn Blake, female    DOB: 1940/09/10, 72 y.o.   MRN: [id number]  HPI 02/03/2011-72 year old female never smoker followed for bronchiectasis, history of MAIC bronchitis, allergic rhinitis. Last here 08/04/2010 Chest x-ray in September of 2011 had shown bilateral scarring, bronchiectasis in the mid zones bilaterally CXR 08/04/2010-stable bronchiectasis, scarring, micro-nodularities in the small area of cavitary change in the right upper lobe consistent with chronic Mycobacterium infection. Acute visit-2 days of chills or lays temperature 100.8 yesterday, sore throat and nasal congestion. Cough is productive of some yellow sputum. No GI complaint.  06/16/11- 72 year old female never smoker followed for bronchiectasis, history of MAIC bronchitis, allergic rhinitis, insomnia. Her breathing has been comfortable. Not much daily cough. More easily winded with activities of daily living over the last 2 weeks but nothing very specific. A few vague twinge pains high in the left upper chest without palpitation or swelling. She has had to have a lot of dental work this winter and has often been uncomfortable from that and her age-related arthritis complaints. As a result she is not sleeping as well. She has taken occasional diazepam, usually one half tablet. We discussed alternatives including a short half-life medication that would be out of her system in the morning.  10/13/11- 72 year old female never smoker followed for bronchiectasis, history of MAIC bronchitis, allergic rhinitis, insomnia. Gets more winded than usual with activity-states she is very active overall. Last weekend had episode of coughing up blood(rich red blood) and then stopped-no more episodes since(Had SOB with it as well)  CXR 06/23/11- 72 year old female never smoker followed for bronchiectasis, history of MAIC bronchitis, allergic rhinitis, insomnia. Gradually more short of breath without increased cough. Notices it   especially after a day of yard work Did cough up some red blood mixed in clear phlegm,  one time, a week or more ago. Some pain left upper anterior chest sounds pleuritic. Few night sweats. CXR- 07/13/11- reviewed images with her IMPRESSION:  Stable chronic lung disease. No acute cardiopulmonary process.  Original Report Authenticated By: Gerrianne Scale, M.D.   02/15/12-  72 year old female never smoker followed for bronchiectasis, history of MAIC bronchitis, allergic rhinitis, insomnia.  Pt states breathing has improved since last visit. She has had no more hemoptysis since last visit. Cough is prod with minimal light green sputum.  Good summer. No blood since last year. Seasonal sneezing and blowing. Husband being treated for colon cancer.  01/18/13-  72 year old female never smoker followed for bronchiectasis, history of MAIC bronchitis, allergic rhinitis, insomnia. FOLLOWS FOR: Chest pain, wheezing, SOB and cough w/mucus yellow in color. Some f/c/s beginning last p.m worsened More cough last week. BR 11/15/2012 for chest pain negative. Last night cough, soreness left upper anterior chest with low-grade fever and sweat. Takes Prilosec and not aware of reflux. Feels run down. Husband dealing with colorectal cancer which is stress for them. CXR 11/15/12 IMPRESSION:  Chronic opacities with bronchiectasis, most prominent in the right  middle lobe and lingula, suspicious for atypical infection such as  MAI.  No definite superimposed opacities suspicious for pneumonia.  Original Report Authenticated By: Charline Bills, M.D.   Review of Systems- see HPI Constitutional:   No-   weight loss, night sweats, , chills, +fatigue, lassitude. HEENT:   No-  headaches, difficulty swallowing, tooth/dental problems, sore throat,       No-  sneezing, itching, ear ache, +nasal congestion, post nasal drip,  CV:  No-   chest pain, orthopnea, PND, swelling in lower  extremities, anasarca,dizziness,  palpitations Resp: + shortness of breath with exertion or at rest.              No-   productive cough,  +he is non-productive cough,  no- coughing up of blood.              No- change in color of mucus.  No- wheezing.   Skin: No-   rash or lesions. GI:  No-   heartburn, indigestion, abdominal pain, nausea, vomiting,  GU:  MS:  No-   joint pain or swelling. . Neuro- grossly normal to observation,   Psych:  No- change in mood or affect. No depression or anxiety.  No memory loss.  Objective:   Physical Exam General- Alert, Oriented, Affect-appropriate, Distress- none acute, petite lady Skin- rash-none, lesions- none, excoriation- none Lymphadenopathy- none Head- atraumatic            Eyes- Gross vision intact, PERRLA, conjunctivae clear secretions            Ears- Hearing, canals- normal            Nose- Clear, No-Septal dev, mucus, polyps, erosion, perforation             Throat- Mallampati II , mucosa clear- not red , drainage- none, tonsils- atrophic, Neck- flexible , trachea midline, no stridor , thyroid nl, carotid no bruit Chest - symmetrical excursion , unlabored           Heart/CV- RRR , no murmur , no gallop  , no rub, nl s1 s2                           - JVD- none , edema- none, stasis changes- none, varices- none           Lung- clear, unlabored, wheeze- none, cough- none , dullness-none, rub- none           Chest wall-  Abd- Br/ Gen/ Rectal- Not done, not indicated Extrem- cyanosis- none, clubbing, none, atrophy- none, strength- nl Neuro- grossly intact to observation

## 2013-01-19 ENCOUNTER — Other Ambulatory Visit: Payer: Medicare Other

## 2013-01-19 DIAGNOSIS — J42 Unspecified chronic bronchitis: Secondary | ICD-10-CM | POA: Diagnosis not present

## 2013-01-22 LAB — RESPIRATORY CULTURE OR RESPIRATORY AND SPUTUM CULTURE
Gram Stain: NONE SEEN
Organism ID, Bacteria: NORMAL

## 2013-01-25 NOTE — Assessment & Plan Note (Signed)
We discussed issues of treatment. Acute exacerbation may be an infection/viral. We are not seeing long-term progression. Plan-sputum cultures, chest x-ray, Augmentin, flu vaccine

## 2013-02-20 ENCOUNTER — Other Ambulatory Visit: Payer: Self-pay | Admitting: *Deleted

## 2013-02-20 MED ORDER — LEVOTHYROXINE SODIUM 75 MCG PO TABS: ORAL_TABLET | ORAL | Status: DC

## 2013-02-27 ENCOUNTER — Encounter: Payer: Self-pay | Admitting: Internal Medicine

## 2013-02-27 ENCOUNTER — Ambulatory Visit (INDEPENDENT_AMBULATORY_CARE_PROVIDER_SITE_OTHER): Payer: Medicare Other | Admitting: Internal Medicine

## 2013-02-27 VITALS — BP 120/70 | HR 74 | Temp 97.0°F

## 2013-02-27 DIAGNOSIS — R1310 Dysphagia, unspecified: Secondary | ICD-10-CM

## 2013-02-27 DIAGNOSIS — F411 Generalized anxiety disorder: Secondary | ICD-10-CM

## 2013-02-27 DIAGNOSIS — K219 Gastro-esophageal reflux disease without esophagitis: Secondary | ICD-10-CM

## 2013-02-27 MED ORDER — SUCRALFATE 1 G PO TABS: 1.0000 g | ORAL_TABLET | Freq: Four times a day (QID) | ORAL | Status: DC

## 2013-02-27 MED ORDER — DIAZEPAM 5 MG PO TABS: 5.0000 mg | ORAL_TABLET | Freq: Two times a day (BID) | ORAL | Status: DC | PRN

## 2013-02-27 NOTE — Progress Notes (Signed)
Subjective:    Patient ID: Marilyn Blake, female    DOB: [birth year], 72 y.o.   MRN: [id number]  HPI  Here for swallow problems - onset 11/15/12 Describes sensation of pills sticking in throat Denies regurgitation of pills, solids or liquids No dysphasia for foods/solids or liquids GI evaluation for same at [facility] Dr. Randa Evens August 2014 -reports no endoscopy recommended Symptoms improved with prescriptions from the emergency room for brief period of time, then recurrent sensation of "pill sticking" last week  Also reviewed anxiety related to decline in spouse's health Intol of paxil due to feeling more depressed on treatment - stopped indep after 2 weeks Began citalopram 10/2012 because sister takes same -but also stopped same after one week trial because of side effects  Denies SI/HI  Past Medical History  Diagnosis Date  . ALLERGIC RHINITIS   . ANXIETY   . BACTEREMIA, MYCOBACTERIUM AVIUM COMPLEX   . BRONCHIECTASIS   . COLONIC POLYPS, HX OF   . GERD   . GOUT   . HYPERLIPIDEMIA   . HYPOTHYROIDISM   . Irritable bowel syndrome   . OSTEOPENIA     Review of Systems  Constitutional: Positive for fatigue. Negative for fever and unexpected weight change.  HENT: Positive for trouble swallowing ("pill stick sometimes", but no regurgitation).   Cardiovascular: Negative for chest pain.  Gastrointestinal: Negative for nausea, vomiting, abdominal pain, diarrhea and blood in stool.  Neurological: Negative for dizziness, syncope, weakness and headaches.  Psychiatric/Behavioral: Positive for sleep disturbance. Negative for self-injury and dysphoric mood. The patient is not nervous/anxious.        Objective:   Physical Exam BP 120/70  Pulse 74  Temp(Src) 97 F (36.1 C) (Oral)  SpO2 96% Wt Readings from Last 3 Encounters:  01/18/13 117 lb (53.071 kg)  10/17/12 115 lb 12.8 oz (52.527 kg)  08/19/12 114 lb 12.8 oz (52.073 kg)   Constitutional: She appears well-developed and  well-nourished. No distress.  Neck: Normal range of motion. Neck supple. No JVD present. No thyromegaly present.  Cardiovascular: Normal rate, regular rhythm and normal heart sounds.  No murmur heard. No BLE edema. Pulmonary/Chest: Effort normal and breath sounds normal. No respiratory distress. She has no wheezes. Psychiatric: She has a mildly anxious mood and affect. Her behavior is normal. Judgment and thought content normal.   Lab Results  Component Value Date   WBC 8.3 11/15/2012   HGB 13.0 11/15/2012   HCT 38.9 11/15/2012   PLT 207 11/15/2012   GLUCOSE 100* 11/15/2012   CHOL 201* 01/18/2013   TRIG 160.0* 01/18/2013   HDL 48.30 01/18/2013   LDLDIRECT 121.7 01/18/2013   LDLCALC 109* 01/14/2010   ALT 15 11/15/2012   AST 27 11/15/2012   NA 139 11/15/2012   K 3.8 11/15/2012   CL 101 11/15/2012   CREATININE 0.83 11/15/2012   BUN 12 11/15/2012   CO2 27 11/15/2012   TSH 4.99 06/08/2012   INR 1.04 11/15/2012       Assessment & Plan:   Swallowing problem - ER visit for same 11/15/12 reviewed- sensation of pill esophagitis but no regurgitation Increased dose PPI -also carafate x 2 weeks - chest pain has resolved Recurrent pill stick sensation last week, but no weight loss -  No dysphagia of solids or liquids - only occ pill eval by GI Claria Dice 12/2012) for same - no endo recommended   Check Barium swallow Resume carafate in addition to ongoing PPI Consider new GI eval if persisting symptoms despite  med tx or if abnormal swallow eval  See problem list. Medications and labs reviewed today.

## 2013-02-27 NOTE — Patient Instructions (Signed)
It was good to see you today.  We have reviewed your prior records including labs and tests today  Test(s) ordered today: barium swallow to evaluate pills sticking sensation. Our office will contact you regarding appointment(s) once made.  Your results will be released to MyChart (or called to you) after review, usually within 72hours after test completion. If any changes need to be made, you will be notified at that same time.  Medications reviewed and updated, continue Carafate 4 times daily as needed for sticking sensation -no other changes recommended at this time.  Your prescription(s) have been submitted to your pharmacy. Please take as directed and contact our office if you believe you are having problem(s) with the medication(s).  followup in 6 months for routine review, please call sooner if problems

## 2013-02-27 NOTE — Assessment & Plan Note (Signed)
Chronic BZs - increasing symptoms related to situational stressors: Support offered as pt is managing husband at home, who ill with surgical abscess and met cancer, home IV abx ongoing.  suspect emotional "stress" as cause of symptoms - cardiac workup negative 06/2012 for "spells" related to same Started Paxil 10mg  qd 08/2012 - intolerant of side effects and stopped after 2 weeks 10/2012 tried citalopram as sister doing well with same - but stopped due to side effects  Ok to continue valium prn -

## 2013-02-27 NOTE — Assessment & Plan Note (Signed)
Prior GI eval - [facility] and [facility] reviewed See above: increase PPI and add Carafate Barium swallow for pill dysphagia but no red flags on hx/exam

## 2013-03-01 ENCOUNTER — Other Ambulatory Visit: Payer: Self-pay

## 2013-03-01 DIAGNOSIS — Z1231 Encounter for screening mammogram for malignant neoplasm of breast: Secondary | ICD-10-CM

## 2013-03-08 LAB — AFB CULTURE WITH SMEAR (NOT AT ARMC): Acid Fast Smear: NONE SEEN

## 2013-03-21 ENCOUNTER — Other Ambulatory Visit: Payer: Self-pay | Admitting: Internal Medicine

## 2013-03-21 DIAGNOSIS — H1045 Other chronic allergic conjunctivitis: Secondary | ICD-10-CM | POA: Diagnosis not present

## 2013-03-21 DIAGNOSIS — H5200 Hypermetropia, unspecified eye: Secondary | ICD-10-CM | POA: Diagnosis not present

## 2013-03-21 DIAGNOSIS — H52229 Regular astigmatism, unspecified eye: Secondary | ICD-10-CM | POA: Diagnosis not present

## 2013-03-21 DIAGNOSIS — H524 Presbyopia: Secondary | ICD-10-CM | POA: Diagnosis not present

## 2013-03-24 ENCOUNTER — Ambulatory Visit (HOSPITAL_COMMUNITY)
Admission: RE | Admit: 2013-03-24 | Discharge: 2013-03-24 | Disposition: A | Discharge status: Home / Self Care | Payer: Medicare Other | Source: Ambulatory Visit | Attending: Internal Medicine | Admitting: Internal Medicine

## 2013-03-24 DIAGNOSIS — K228 Other specified diseases of esophagus: Secondary | ICD-10-CM | POA: Insufficient documentation

## 2013-03-24 DIAGNOSIS — K219 Gastro-esophageal reflux disease without esophagitis: Secondary | ICD-10-CM

## 2013-03-24 DIAGNOSIS — K2289 Other specified disease of esophagus: Secondary | ICD-10-CM | POA: Diagnosis not present

## 2013-03-24 DIAGNOSIS — R1310 Dysphagia, unspecified: Secondary | ICD-10-CM

## 2013-03-24 NOTE — Progress Notes (Signed)
Quick Note:  LMTCB ______ 

## 2013-03-27 NOTE — Progress Notes (Signed)
Quick Note:  Pt aware of results. Will keep [place] informed if she has cough-productive or blood in it then patient will call us for sooner OV. ______

## 2013-03-28 ENCOUNTER — Ambulatory Visit
Admission: RE | Admit: 2013-03-28 | Discharge: 2013-03-28 | Disposition: A | Discharge status: Home / Self Care | Payer: Medicare Other | Source: Ambulatory Visit

## 2013-03-28 DIAGNOSIS — Z1231 Encounter for screening mammogram for malignant neoplasm of breast: Secondary | ICD-10-CM | POA: Diagnosis not present

## 2013-06-08 ENCOUNTER — Other Ambulatory Visit: Payer: Self-pay | Admitting: Internal Medicine

## 2013-06-08 NOTE — Telephone Encounter (Signed)
MD is out of office pls advise on refill.../lmb 

## 2013-06-09 NOTE — Telephone Encounter (Signed)
Faxed hardcopy to [facility] 

## 2013-06-13 DIAGNOSIS — M898X9 Other specified disorders of bone, unspecified site: Secondary | ICD-10-CM | POA: Diagnosis not present

## 2013-06-13 DIAGNOSIS — M722 Plantar fascial fibromatosis: Secondary | ICD-10-CM | POA: Diagnosis not present

## 2013-06-15 ENCOUNTER — Ambulatory Visit: Payer: Medicare Other | Admitting: Internal Medicine

## 2013-06-21 ENCOUNTER — Ambulatory Visit (INDEPENDENT_AMBULATORY_CARE_PROVIDER_SITE_OTHER): Payer: Medicare Other | Admitting: Internal Medicine

## 2013-06-21 ENCOUNTER — Encounter: Payer: Self-pay | Admitting: Internal Medicine

## 2013-06-21 VITALS — BP 110/60 | HR 69 | Temp 97.0°F | Wt 115.4 lb

## 2013-06-21 DIAGNOSIS — M899 Disorder of bone, unspecified: Secondary | ICD-10-CM | POA: Diagnosis not present

## 2013-06-21 DIAGNOSIS — M949 Disorder of cartilage, unspecified: Secondary | ICD-10-CM | POA: Diagnosis not present

## 2013-06-21 DIAGNOSIS — L03019 Cellulitis of unspecified finger: Secondary | ICD-10-CM

## 2013-06-21 DIAGNOSIS — IMO0001 Reserved for inherently not codable concepts without codable children: Secondary | ICD-10-CM

## 2013-06-21 NOTE — Patient Instructions (Addendum)
It was good to see you today.  We have reviewed your prior records including labs and tests today  Soak your affected finger in warm soapy water for 5 minutes 3 times daily over the next week, then as needed. Cover as needed for protection. Call if increasing redness, drainage or any swelling of your fingertip  DEXA ordered today. Your results will be released to [place] (or called to you) after review, usually within 72hours after test completion. If any changes need to be made, you will be notified at that same time.  followup as scheduled, please call sooner if problems  Paronychia Paronychia is an inflammatory reaction involving the folds of the skin surrounding the fingernail. This is commonly caused by an infection in the skin around a nail. The most common cause of paronychia is frequent wetting of the hands (as seen with bartenders, food servers, nurses or others who wet their hands). This makes the skin around the fingernail susceptible to infection by bacteria (germs) or fungus. Other predisposing factors are:  Aggressive manicuring.  Nail biting.  Thumb sucking. The most common cause is a staphylococcal (a type of germ) infection, or a fungal (Candida) infection. When caused by a germ, it usually comes on suddenly with redness, swelling, pus and is often painful. It may get under the nail and form an abscess (collection of pus), or form an abscess around the nail. If the nail itself is infected with a fungus, the treatment is usually prolonged and may require oral medicine for up to one year. Your caregiver will determine the length of time treatment is required. The paronychia caused by bacteria (germs) may largely be avoided by not pulling on hangnails or picking at cuticles. When the infection occurs at the tips of the finger it is called felon. When the cause of paronychia is from the herpes simplex virus (HSV) it is called herpetic whitlow. TREATMENT  When an abscess is present  treatment is often incision and drainage. This means that the abscess must be cut open so the pus can get out. When this is done, the following home care instructions should be followed. HOME CARE INSTRUCTIONS   It is important to keep the affected fingers very dry. Rubber or plastic gloves over cotton gloves should be used whenever the hand must be placed in water.  Keep wound clean, dry and dressed as suggested by your caregiver between warm soaks or warm compresses.  Soak in warm water for fifteen to twenty minutes three to four times per day for bacterial infections. Fungal infections are very difficult to treat, so often require treatment for long periods of time.  For bacterial (germ) infections take antibiotics (medicine which kill germs) as directed and finish the prescription, even if the problem appears to be solved before the medicine is gone.  Only take over-the-counter or prescription medicines for pain, discomfort, or fever as directed by your caregiver. SEEK IMMEDIATE MEDICAL CARE IF:  You have redness, swelling, or increasing pain in the wound.  You notice pus coming from the wound.  You have a fever.  You notice a bad smell coming from the wound or dressing. Document Released: 10/28/2000 Document Revised: 07/27/2011 Document Reviewed: 06/29/2008 [facility] Patient Information 2014 [place].

## 2013-06-21 NOTE — Progress Notes (Signed)
Pre-visit discussion using our clinic review tool. No additional management support is needed unless otherwise documented below in the visit note.  

## 2013-06-21 NOTE — Progress Notes (Signed)
   Subjective:    Patient ID: Marilyn Blake, female    DOB: May 03, 1941, 73 y.o.   MRN: [id number]  HPI  complains of fingernail problem (r middle finger) Onset months ago, increase erythema and pain in past 72h, spontaneous drainage in past 24h and improved  Also requests DEXA  Past Medical History  Diagnosis Date  . ALLERGIC RHINITIS   . ANXIETY   . BACTEREMIA, MYCOBACTERIUM AVIUM COMPLEX   . BRONCHIECTASIS   . COLONIC POLYPS, HX OF   . GERD   . GOUT   . HYPERLIPIDEMIA   . HYPOTHYROIDISM   . Irritable bowel syndrome   . OSTEOPENIA     Review of Systems  Constitutional: Negative for fever and fatigue.  Musculoskeletal: Negative for arthralgias and joint swelling.  Skin: Positive for wound (base of middle finger nailbed, right hand). Negative for rash.       Objective:   Physical Exam BP 110/60  Pulse 69  Temp(Src) 97 F (36.1 C) (Oral)  Wt 115 lb 6.4 oz (52.345 kg)  SpO2 94% Wt Readings from Last 3 Encounters:  06/21/13 115 lb 6.4 oz (52.345 kg)  01/18/13 117 lb (53.071 kg)  10/17/12 115 lb 12.8 oz (52.527 kg)   Constitutional: She appears well-developed and well-nourished. No distress.  Musculoskeletal: no gross deformity Skin: paronychia base of 3rd finger on right hand. Spontaneous drainage, no residual pocket, minimal erythema and nontender. Chronic nail ridge extending length of nail ending at base at wound  Skin is warm and dry. No rash noted. No erythema.  Psychiatric: She has a normal mood and affect. Her behavior is normal. Judgment and thought content normal.   Lab Results  Component Value Date   WBC 8.3 11/15/2012   HGB 13.0 11/15/2012   HCT 38.9 11/15/2012   PLT 207 11/15/2012   GLUCOSE 100* 11/15/2012   CHOL 201* 01/18/2013   TRIG 160.0* 01/18/2013   HDL 48.30 01/18/2013   LDLDIRECT 121.7 01/18/2013   LDLCALC 109* 01/14/2010   ALT 15 11/15/2012   AST 27 11/15/2012   NA 139 11/15/2012   K 3.8 11/15/2012   CL 101 11/15/2012   CREATININE 0.83 11/15/2012   BUN 12 11/15/2012    CO2 27 11/15/2012   TSH 4.99 06/08/2012   INR 1.04 11/15/2012        Assessment & Plan:   1. Paronychia of third finger of right hand Spontaneously drained - educated on need to soak and cover - educated on dx and mgmt - no indication for systemic abx or additional drainage.  2. OSTEOPENIA Now willing to proceed with DEXA - DG Bone Density; Future   .

## 2013-06-21 NOTE — Assessment & Plan Note (Signed)
On calcium with weight bearing activities, no prescription therapy for same Check DEXA, consider treatment if needed

## 2013-07-12 ENCOUNTER — Inpatient Hospital Stay: Admission: RE | Admit: 2013-07-12 | Payer: Medicare Other | Source: Ambulatory Visit

## 2013-07-18 ENCOUNTER — Ambulatory Visit: Payer: Medicare Other | Admitting: Internal Medicine

## 2013-07-18 DIAGNOSIS — M7130 Other bursal cyst, unspecified site: Secondary | ICD-10-CM | POA: Diagnosis not present

## 2013-07-19 ENCOUNTER — Ambulatory Visit (INDEPENDENT_AMBULATORY_CARE_PROVIDER_SITE_OTHER)
Admission: RE | Admit: 2013-07-19 | Discharge: 2013-07-19 | Disposition: A | Discharge status: Home / Self Care | Payer: Medicare Other | Source: Ambulatory Visit | Attending: Internal Medicine | Admitting: Internal Medicine

## 2013-07-19 DIAGNOSIS — M949 Disorder of cartilage, unspecified: Secondary | ICD-10-CM | POA: Diagnosis not present

## 2013-07-19 DIAGNOSIS — M899 Disorder of bone, unspecified: Secondary | ICD-10-CM | POA: Diagnosis not present

## 2013-07-24 ENCOUNTER — Encounter: Payer: Self-pay | Admitting: Internal Medicine

## 2013-07-24 ENCOUNTER — Other Ambulatory Visit: Payer: Self-pay | Admitting: Internal Medicine

## 2013-07-31 DIAGNOSIS — M6740 Ganglion, unspecified site: Secondary | ICD-10-CM | POA: Diagnosis not present

## 2013-07-31 DIAGNOSIS — M19049 Primary osteoarthritis, unspecified hand: Secondary | ICD-10-CM | POA: Diagnosis not present

## 2013-08-01 ENCOUNTER — Other Ambulatory Visit: Payer: Self-pay | Admitting: Orthopedic Surgery

## 2013-08-03 ENCOUNTER — Ambulatory Visit: Payer: Medicare Other | Admitting: Internal Medicine

## 2013-08-08 NOTE — Progress Notes (Signed)
Pt has a previous MR# with all info-her name is Marilyn Blake- Caryl Pina has put in to correct her name and merge charts-so we can get correct name and # preop done

## 2013-08-09 NOTE — H&P (Signed)
Marilyn Blake is an 73 y.o. female.   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 She noted the deformity of her radial nail plate in December 1610. She reviewed this predicament with her primary care physician, Dr. Gwendolyn Grant and then was referred for a dermatology consult. Very appropriately she has been identified with a mucoid cyst and has been referred for a hand surgery consult.     Past Medical History  Diagnosis Date  . ALLERGIC RHINITIS   . ANXIETY   . BACTEREMIA, MYCOBACTERIUM AVIUM COMPLEX   . BRONCHIECTASIS   . COLONIC POLYPS, HX OF   . GERD   . GOUT   . HYPERLIPIDEMIA   . HYPOTHYROIDISM   . Irritable bowel syndrome   . OSTEOPENIA     Past Surgical History  Procedure Laterality Date  . Thyroidectomy      Hurthele cell tumor  . Abdominal hysterectomy    . Bladder suspension    . Carpal tunnel release      Left wrist    Family History  Problem Relation Age of Onset  . Cancer Mother 71    cervical cancer, died age 74  . Heart attack Father 62   Social History:  reports that she has never smoked. She does not have any smokeless tobacco history on file. She reports that she does not drink alcohol or use illicit drugs.  Allergies:  Allergies  Allergen Reactions  . Avelox [Moxifloxacin Hcl In Nacl]   . Ethambutol Hcl     REACTION: stomach pain  . Moxifloxacin     REACTION: mild rash - causal??    No prescriptions prior to admission    No results found for this or any previous visit (from the past 48 hour(s)).  No results found.   Pertinent items are noted in HPI.  There were no vitals taken for this visit.  General appearance: alert Head: Normocephalic, without obvious abnormality Neck: supple, symmetrical, trachea  midline Resp: clear to auscultation bilaterally Cardio: regular rate and rhythm GI: normal findings: bowel sounds normal Extremities: . Inspection of her hands reveals Heberden's and Bouchard's nodes. Inspection of her right long finger reveals a 4 mm in diameter mucoid cyst at the radial nail fold. She has a full length nail groove in the radial nail plate measuring 4 mm in width. She has a palpable bump in the radial aspect of her right ring finger DIP joint consistent with an osteophyte or loose body. She has full ROM of her fingers in flexion and extension. Her pulses and capillary refill are intact. Her motor and sensory exam is intact. She has intact sensibility in her thumb and finger tips.   X-rays of her fingers demonstrate narrowing of her DIP joints index, long, ring and small. She has a calcification/ossification of the radial aspect of her right ring finger DIP joint consistent with osteoarthritis or loose body. She also has loose body in her right long finger PIP joint.  Pulses: 2+ and symmetric Skin: normal Neurologic: Grossly normal    Assessment/Plan Impression: Mucoid cyst right long finger DIP joint  Plan: To the OR for excision of cyst and debridement of DIP joint right long finger.The procedure, risks,benefits and post-op course were discussed with the patient at length and they  were in agreement with the plan.  Marilyn Blake,Marilyn Blake Blake 08/09/2013, 4:36 PM   H&P documentation: 08/10/2013  -History and Physical Reviewed  -Patient has been re-examined  -No change in the plan of care  Cammie Sickle, MD

## 2013-08-10 ENCOUNTER — Encounter (HOSPITAL_BASED_OUTPATIENT_CLINIC_OR_DEPARTMENT_OTHER)
Admission: RE | Disposition: A | Discharge status: Home / Self Care | Payer: Self-pay | Source: Ambulatory Visit | Attending: Orthopedic Surgery

## 2013-08-10 ENCOUNTER — Encounter (HOSPITAL_BASED_OUTPATIENT_CLINIC_OR_DEPARTMENT_OTHER): Payer: Self-pay | Admitting: Orthopedic Surgery

## 2013-08-10 ENCOUNTER — Ambulatory Visit (HOSPITAL_BASED_OUTPATIENT_CLINIC_OR_DEPARTMENT_OTHER)
Admission: RE | Admit: 2013-08-10 | Discharge: 2013-08-10 | Disposition: A | Discharge status: Home / Self Care | Payer: Medicare Other | Source: Ambulatory Visit | Attending: Orthopedic Surgery | Admitting: Orthopedic Surgery

## 2013-08-10 DIAGNOSIS — E785 Hyperlipidemia, unspecified: Secondary | ICD-10-CM | POA: Insufficient documentation

## 2013-08-10 DIAGNOSIS — Z860100 Personal history of colon polyps, unspecified: Secondary | ICD-10-CM | POA: Insufficient documentation

## 2013-08-10 DIAGNOSIS — M109 Gout, unspecified: Secondary | ICD-10-CM | POA: Diagnosis not present

## 2013-08-10 DIAGNOSIS — M6740 Ganglion, unspecified site: Secondary | ICD-10-CM | POA: Diagnosis not present

## 2013-08-10 DIAGNOSIS — F411 Generalized anxiety disorder: Secondary | ICD-10-CM | POA: Insufficient documentation

## 2013-08-10 DIAGNOSIS — J309 Allergic rhinitis, unspecified: Secondary | ICD-10-CM | POA: Insufficient documentation

## 2013-08-10 DIAGNOSIS — K219 Gastro-esophageal reflux disease without esophagitis: Secondary | ICD-10-CM | POA: Diagnosis not present

## 2013-08-10 DIAGNOSIS — M899 Disorder of bone, unspecified: Secondary | ICD-10-CM | POA: Insufficient documentation

## 2013-08-10 DIAGNOSIS — E039 Hypothyroidism, unspecified: Secondary | ICD-10-CM | POA: Diagnosis not present

## 2013-08-10 DIAGNOSIS — M949 Disorder of cartilage, unspecified: Secondary | ICD-10-CM | POA: Diagnosis not present

## 2013-08-10 DIAGNOSIS — M24149 Other articular cartilage disorders, unspecified hand: Secondary | ICD-10-CM | POA: Diagnosis not present

## 2013-08-10 DIAGNOSIS — K589 Irritable bowel syndrome without diarrhea: Secondary | ICD-10-CM | POA: Insufficient documentation

## 2013-08-10 DIAGNOSIS — M19049 Primary osteoarthritis, unspecified hand: Secondary | ICD-10-CM | POA: Diagnosis not present

## 2013-08-10 HISTORY — PX: MASS EXCISION: SHX2000

## 2013-08-10 SURGERY — MINOR EXCISION OF MASS
Anesthesia: LOCAL | Site: Finger | Laterality: Right

## 2013-08-10 MED ORDER — DOXYCYCLINE HYCLATE 100 MG PO TABS: 100.0000 mg | ORAL_TABLET | Freq: Two times a day (BID) | ORAL | Status: DC

## 2013-08-10 MED ORDER — LIDOCAINE HCL 2 % IJ SOLN
INTRAMUSCULAR | Status: DC | PRN
  Administered 2013-08-10: 6 mL

## 2013-08-10 MED ORDER — HYDROCODONE-ACETAMINOPHEN 5-325 MG PO TABS: 1.0000 | ORAL_TABLET | Freq: Four times a day (QID) | ORAL | Status: DC | PRN

## 2013-08-10 MED ORDER — CHLORHEXIDINE GLUCONATE 4 % EX LIQD: 60.0000 mL | Freq: Once | CUTANEOUS | Status: DC

## 2013-08-10 SURGICAL SUPPLY — 46 items
BANDAGE ADH SHEER 1  50/CT (GAUZE/BANDAGES/DRESSINGS) IMPLANT
BANDAGE COBAN STERILE 2 (GAUZE/BANDAGES/DRESSINGS) IMPLANT
BLADE SURG 15 STRL LF DISP TIS (BLADE) ×1 IMPLANT
BLADE SURG 15 STRL SS (BLADE) ×2
BNDG CMPR 9X4 STRL LF SNTH (GAUZE/BANDAGES/DRESSINGS)
BNDG CMPR MD 5X2 ELC HKLP STRL (GAUZE/BANDAGES/DRESSINGS)
BNDG COHESIVE 1X5 TAN STRL LF (GAUZE/BANDAGES/DRESSINGS) ×1 IMPLANT
BNDG ELASTIC 2 VLCR STRL LF (GAUZE/BANDAGES/DRESSINGS) IMPLANT
BNDG ESMARK 4X9 LF (GAUZE/BANDAGES/DRESSINGS) IMPLANT
BRUSH SCRUB EZ PLAIN DRY (MISCELLANEOUS) ×2 IMPLANT
CORDS BIPOLAR (ELECTRODE) IMPLANT
COVER MAYO STAND STRL (DRAPES) ×2 IMPLANT
CUFF TOURNIQUET SINGLE 18IN (TOURNIQUET CUFF) IMPLANT
DECANTER SPIKE VIAL GLASS SM (MISCELLANEOUS) IMPLANT
DRAIN PENROSE 1/2X12 LTX STRL (WOUND CARE) IMPLANT
DRAPE SURG 17X23 STRL (DRAPES) ×1 IMPLANT
GAUZE XEROFORM 1X8 LF (GAUZE/BANDAGES/DRESSINGS) IMPLANT
GLOVE BIOGEL M STRL SZ7.5 (GLOVE) ×1 IMPLANT
GLOVE BIOGEL PI IND STRL 7.0 (GLOVE) IMPLANT
GLOVE BIOGEL PI INDICATOR 7.0 (GLOVE) ×1
GLOVE ECLIPSE 6.5 STRL STRAW (GLOVE) ×1 IMPLANT
GLOVE EXAM NITRILE EXT CUFF MD (GLOVE) ×1 IMPLANT
GLOVE ORTHO TXT STRL SZ7.5 (GLOVE) ×2 IMPLANT
GOWN STRL REUS W/ TWL LRG LVL3 (GOWN DISPOSABLE) ×1 IMPLANT
GOWN STRL REUS W/ TWL XL LVL3 (GOWN DISPOSABLE) ×2 IMPLANT
GOWN STRL REUS W/TWL LRG LVL3 (GOWN DISPOSABLE) ×2
GOWN STRL REUS W/TWL XL LVL3 (GOWN DISPOSABLE) ×2
NDL BLUNT 17GA (NEEDLE) IMPLANT
NEEDLE 27GAX1X1/2 (NEEDLE) ×1 IMPLANT
NEEDLE BLUNT 17GA (NEEDLE) ×2 IMPLANT
PACK BASIN DAY SURGERY FS (CUSTOM PROCEDURE TRAY) ×2 IMPLANT
PADDING CAST ABS 4INX4YD NS (CAST SUPPLIES)
PADDING CAST ABS COTTON 4X4 ST (CAST SUPPLIES) ×1 IMPLANT
SPONGE GAUZE 4X4 12PLY (GAUZE/BANDAGES/DRESSINGS) ×1 IMPLANT
SPONGE GAUZE 4X4 12PLY STER LF (GAUZE/BANDAGES/DRESSINGS) ×3 IMPLANT
STOCKINETTE 4X48 STRL (DRAPES) ×2 IMPLANT
STRIP CLOSURE SKIN 1/2X4 (GAUZE/BANDAGES/DRESSINGS) ×1 IMPLANT
SUT ETHILON 5 0 P 3 18 (SUTURE) ×1
SUT NYLON ETHILON 5-0 P-3 1X18 (SUTURE) ×1 IMPLANT
SUT PROLENE 4 0 P 3 18 (SUTURE) ×1 IMPLANT
SYR 20CC LL (SYRINGE) ×1 IMPLANT
SYR 3ML 23GX1 SAFETY (SYRINGE) IMPLANT
SYR CONTROL 10ML LL (SYRINGE) ×1 IMPLANT
TOWEL OR 17X24 6PK STRL BLUE (TOWEL DISPOSABLE) ×4 IMPLANT
TRAY DSU PREP LF (CUSTOM PROCEDURE TRAY) ×2 IMPLANT
UNDERPAD 30X30 INCONTINENT (UNDERPADS AND DIAPERS) ×1 IMPLANT

## 2013-08-10 NOTE — Brief Op Note (Signed)
08/10/2013  11:12 AM  PATIENT:  Marilyn Blake  73 y.o. female  PRE-OPERATIVE DIAGNOSIS:  mucoid cyst right long finger and right small finger  POST-OPERATIVE DIAGNOSIS:  mucoid cyst right long finger and right small finger  PROCEDURE:  Procedure(s): DEBRIDE DISTAL INTERPHALANGEAL JOINT/EXCISION MUCOID CYST RIGHT LONG FINGER and right small finger (Right)  SURGEON:  Surgeon(s) and Role:    * Cammie Sickle., MD - Primary  PHYSICIAN ASSISTANT:   ASSISTANTS: surgical tech  ANESTHESIA:   local  EBL:     BLOOD ADMINISTERED:none  DRAINS: none   LOCAL MEDICATIONS USED:  XYLOCAINE   SPECIMEN:  No Specimen  DISPOSITION OF SPECIMEN:  N/A  COUNTS:  YES  TOURNIQUET:    DICTATION: .Other Dictation: Dictation Number [id number]  PLAN OF CARE: Discharge to home after PACU  PATIENT DISPOSITION:  PACU - hemodynamically stable.   Delay start of Pharmacological VTE agent (>24hrs) due to surgical blood loss or risk of bleeding: not applicable

## 2013-08-10 NOTE — Op Note (Signed)
[id number] 

## 2013-08-10 NOTE — Discharge Instructions (Signed)

## 2013-08-11 ENCOUNTER — Encounter (HOSPITAL_BASED_OUTPATIENT_CLINIC_OR_DEPARTMENT_OTHER): Payer: Self-pay | Admitting: Orthopedic Surgery

## 2013-08-11 NOTE — Op Note (Signed)
Marilyn Blake, Marilyn Blake                ACCOUNT NO.:  [id number]  MEDICAL RECORD NO.:  [id number]  LOCATION:                                 FACILITY:  PHYSICIAN:  Marilyn Mighty. Carel Carrier, M.D.      DATE OF BIRTH:  DATE OF PROCEDURE:  08/10/2013 DATE OF DISCHARGE:                              OPERATIVE REPORT   PREOPERATIVE DIAGNOSES: 1. 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 2. Mucoid cyst, dorsal radial aspect of right small finger without     nail groove.  OPERATION: 1. Arthrotomy of right long finger distal interphalangeal joint with     removal of marginal osteophytes, loose bodies and joint irrigation. 2. Resection of dorsal nail fold mucoid cyst, with decompression of     germinal nail matrix. 3. Decompression of germinal nail matrix of right small finger. 4. Distal interphalangeal joint joint debridement with synovectomy and     irrigation of right small finger.  OPERATING SURGEON:  Marilyn Mighty. Jorita Bohanon, MD  ASSISTANT:  Surgical technician.  ANESTHESIA:  A 2% lidocaine metacarpal head level block, total of 5.5 mL.  This was performed as a minor operating room procedure.  INDICATIONS:  Marilyn Blake is a 73 year old woman referred through the courtesy of Dr. Harriett Sine, attending dermatologist for management of mucoid cyst.  She was familiar with our practice.  We had a detailed discussion regarding the pathophysiology of mucoid cyst formation.  These tend to form distal interphalangeal joint arthropathy specifically degenerative arthritis with loose bodies.  X-rays of her hand revealed bone-on-bone arthropathy, but no clear loose bodies.  We advised debridement of the DIP joints with removal of marginal osteophytes, and cyst resection with decompression of the nail mechanism of the long  finger and decompression of the nail fold in the small finger.  After informed consent, she is brought to the operating room at this time.  PROCEDURE:  Marilyn Blake was interviewed in the holding area and her right long and small fingers identified as proper surgical site per protocol with marking pen.  After informed consent and discussion of local anesthesia techniques, she was transferred to room 1 of the [facility] where under my direct supervision her hand was prepped with Betadine.  We then provided digital block anesthesia by injecting through the webs for the long finger a total volume of approximately 3 mL, small finger 2.5 mL.  After 5 minutes, excellent anesthesia of each finger was achieved.  The right hand and arm were then prepped with Betadine soap and solution and sterilely draped.  We did not use pneumatic tourniquet.  Our plan was to use a digital tourniquet with a 0.5-inch Penrose drain.  After routine surgical time-out, we exsanguinated the right long finger with a gauze wrap and placed a 0.5-inch Penrose drain over the proximal phalangeal segment with the hemostats as a digital tourniquet. Procedure commenced with curvilinear incision incorporating the cyst distally and exposing the dorsal radial aspect of the long finger  distal interphalangeal joint.  The capsule between the terminal extensor tendon slip and the radial collateral ligament was resected sharply with a scalpel followed by use of rongeur to remove capsule and perform a synovectomy.  We then explored the joint and did not find any frank loose body but did perform a thorough irrigation.  The marginal osteophytes at the base of the distal phalanx were resected followed by following the sinus tract towards the cyst.  We excised the neck of the cyst as well as the cyst from the dorsal nail fold.  This decompressed the germinal nail matrix.  The wound was thoroughly irrigated and closed with  intradermal 4-0 Prolene.  Steri-Strips applied followed by compressive dressing, sterile gauze, and Coban.  Attention was then directed to the right small finger.  The small finger was exsanguinated with a gauze wrap and a half-inch Penrose drain placed at the proximal phalangeal segment.  A curvilinear incision was fashioned incorporating the cyst as well as exposure of the dorsal radial aspect of the DIP joint.  The capsule between the terminal extensor tendon slip and the radial collateral ligament was resected.  The joint line was examined.  No loose bodies identified.  The joint was irrigated and synovectomy accomplished.  We then excised the cyst with a rongeur.  This wound was then repaired with intradermal 4-0 Prolene and a Steri-Strip.  For aftercare, Ms. Constance Haw was placed in compressive dressings.  After the tourniquet was released and the capillary refill was achieved.  The fingers will be re-examined in 24 hours with dressing change so that she does not have bloody bandages through the weekend.  She is provided prescriptions for doxycycline 100 mg p.o. b.i.d. with 8 ounce glass of water x4 days as a prophylactic antibiotic, and Norco 5 mg 1 p.o. q.4-6 hours p.r.n. pain, 20 tablets without refill.     Marilyn Mighty Jabril Pursell, M.D.     RVS/MEDQ  D:  08/10/2013  T:  08/11/2013  Job:  [id number]

## 2013-08-16 DIAGNOSIS — S61409A Unspecified open wound of unspecified hand, initial encounter: Secondary | ICD-10-CM | POA: Diagnosis not present

## 2013-08-21 DIAGNOSIS — M6740 Ganglion, unspecified site: Secondary | ICD-10-CM | POA: Diagnosis not present

## 2013-08-29 ENCOUNTER — Ambulatory Visit: Payer: Medicare Other | Admitting: Internal Medicine

## 2013-09-07 ENCOUNTER — Other Ambulatory Visit: Payer: Self-pay | Admitting: *Deleted

## 2013-09-08 MED ORDER — DIAZEPAM 5 MG PO TABS: 5.0000 mg | ORAL_TABLET | Freq: Two times a day (BID) | ORAL | Status: DC | PRN

## 2013-09-08 NOTE — Telephone Encounter (Signed)
Faxed script back to [facility].../lmb 

## 2013-09-19 ENCOUNTER — Other Ambulatory Visit: Payer: Self-pay | Admitting: Internal Medicine

## 2013-10-20 ENCOUNTER — Ambulatory Visit: Payer: Medicare Other | Admitting: Internal Medicine

## 2013-10-23 ENCOUNTER — Ambulatory Visit (INDEPENDENT_AMBULATORY_CARE_PROVIDER_SITE_OTHER)
Admission: RE | Admit: 2013-10-23 | Discharge: 2013-10-23 | Disposition: A | Discharge status: Home / Self Care | Payer: Medicare Other | Source: Ambulatory Visit | Attending: Internal Medicine | Admitting: Internal Medicine

## 2013-10-23 ENCOUNTER — Encounter (INDEPENDENT_AMBULATORY_CARE_PROVIDER_SITE_OTHER): Payer: Self-pay

## 2013-10-23 ENCOUNTER — Ambulatory Visit (INDEPENDENT_AMBULATORY_CARE_PROVIDER_SITE_OTHER): Payer: Medicare Other | Admitting: Internal Medicine

## 2013-10-23 ENCOUNTER — Encounter: Payer: Self-pay | Admitting: Internal Medicine

## 2013-10-23 VITALS — BP 122/68 | HR 80 | Ht 63.0 in | Wt 116.6 lb

## 2013-10-23 DIAGNOSIS — J479 Bronchiectasis, uncomplicated: Secondary | ICD-10-CM

## 2013-10-23 DIAGNOSIS — R042 Hemoptysis: Secondary | ICD-10-CM | POA: Diagnosis not present

## 2013-10-23 DIAGNOSIS — J441 Chronic obstructive pulmonary disease with (acute) exacerbation: Secondary | ICD-10-CM | POA: Diagnosis not present

## 2013-10-23 DIAGNOSIS — R072 Precordial pain: Secondary | ICD-10-CM

## 2013-10-23 DIAGNOSIS — J449 Chronic obstructive pulmonary disease, unspecified: Secondary | ICD-10-CM | POA: Diagnosis not present

## 2013-10-23 NOTE — Progress Notes (Signed)
Patient ID: Marilyn Blake, female    DOB: 14-Dec-1940, 73 y.o.   MRN: [id number]  HPI 02/03/2011-73 year old female never smoker followed for bronchiectasis, history of MAIC bronchitis, allergic rhinitis. Last here 08/04/2010 Chest x-ray in September of 2011 had shown bilateral scarring, bronchiectasis in the mid zones bilaterally CXR 08/04/2010-stable bronchiectasis, scarring, micro-nodularities in the small area of cavitary change in the right upper lobe consistent with chronic Mycobacterium infection. Acute visit-2 days of chills or lays temperature 100.8 yesterday, sore throat and nasal congestion. Cough is productive of some yellow sputum. No GI complaint.  06/16/11- 73 year old female never smoker followed for bronchiectasis, history of MAIC bronchitis, allergic rhinitis, insomnia. Her breathing has been comfortable. Not much daily cough. More easily winded with activities of daily living over the last 2 weeks but nothing very specific. A few vague twinge pains high in the left upper chest without palpitation or swelling. She has had to have a lot of dental work this winter and has often been uncomfortable from that and her age-related arthritis complaints. As a result she is not sleeping as well. She has taken occasional diazepam, usually one half tablet. We discussed alternatives including a short half-life medication that would be out of her system in the morning.  10/13/11- 73 year old female never smoker followed for bronchiectasis, history of MAIC bronchitis, allergic rhinitis, insomnia. Gets more winded than usual with activity-states she is very active overall. Last weekend had episode of coughing up blood(rich red blood) and then stopped-no more episodes since(Had SOB with it as well)  CXR 06/23/11- 73 year old female never smoker followed for bronchiectasis, history of MAIC bronchitis, allergic rhinitis, insomnia. Gradually more short of breath without increased cough. Notices it   especially after a day of yard work Did cough up some red blood mixed in clear phlegm,  one time, a week or more ago. Some pain left upper anterior chest sounds pleuritic. Few night sweats. CXR- 07/13/11- reviewed images with her IMPRESSION:  Stable chronic lung disease. No acute cardiopulmonary process.  Original Report Authenticated By: Vivia Ewing, M.D.   02/15/12-  73 year old female never smoker followed for bronchiectasis, history of MAIC bronchitis, allergic rhinitis, insomnia.  Pt states breathing has improved since last visit. She has had no more hemoptysis since last visit. Cough is prod with minimal light green sputum.  Good summer. No blood since last year. Seasonal sneezing and blowing. Husband being treated for colon cancer.  01/18/13-  73 year old female never smoker followed for bronchiectasis, history of MAIC bronchitis, allergic rhinitis, insomnia. FOLLOWS FOR: Chest pain, wheezing, SOB and cough w/mucus yellow in color. Some f/c/s beginning last p.m worsened More cough last week. BR 11/15/2012 for chest pain negative. Last night cough, soreness left upper anterior chest with low-grade fever and sweat. Takes Prilosec and not aware of reflux. Feels run down. Husband dealing with colorectal cancer which is stress for them. CXR 11/15/12 IMPRESSION:  Chronic opacities with bronchiectasis, most prominent in the right  middle lobe and lingula, suspicious for atypical infection such as  MAI.  No definite superimposed opacities suspicious for pneumonia.  Original Report Authenticated By: Julian Hy, M.D.  10/23/13-73 year old female never smoker followed for bronchiectasis, recurrent MAIC bronchitis, allergic rhinitis, insomnia. FOLLOWS FOR: SOB and left sided CP since lat OV. Has been under more stress as well 3 weeks increased DOE hills, dry cough, night sweats. L parasternal soreness with reaching or deep breath.  Sputum cx 9 4/14 + MAIC, neg routine flora, neg  fungi  Review  of Systems- see HPI Constitutional:   No-   weight loss, +night sweats, , chills, +fatigue, lassitude. HEENT:   No-  headaches, difficulty swallowing, tooth/dental problems, sore throat,       No-  sneezing, itching, ear ache, +nasal congestion, post nasal drip,  CV:  +atypicalchest pain, orthopnea, PND, swelling in lower extremities, anasarca,dizziness, palpitations Resp: + shortness of breath with exertion or at rest.              No-   productive cough,  + non-productive cough,  no- coughing up of blood.              No- change in color of mucus.  No- wheezing.   Skin: No-   rash or lesions. GI:  No-   heartburn, indigestion, abdominal pain, nausea, vomiting,  GU:  MS:  No-   joint pain or swelling. . Neuro- grossly normal to observation,   Psych:  No- change in mood or affect. No depression or anxiety.  No memory loss.  Objective:   Physical Exam General- Alert, Oriented, Affect-appropriate, Distress- none acute, petite lady Skin- rash-none, lesions- none, excoriation- none Lymphadenopathy- none Head- atraumatic            Eyes- Gross vision intact, PERRLA, conjunctivae clear secretions            Ears- Hearing, canals- normal            Nose- Clear, No-Septal dev, mucus, polyps, erosion, perforation             Throat- Mallampati II , mucosa clear- not red , drainage- none, tonsils- atrophic, Neck- flexible , trachea midline, no stridor , thyroid nl, carotid no bruit Chest - symmetrical excursion , unlabored           Heart/CV- RRR , no murmur , no gallop  , no rub, nl s1 s2                           - JVD- none , edema- none, stasis changes- none, varices- none           Lung- +trace crackles, unlabored, wheeze- none, cough- none , dullness-none, rub- none           Chest wall- Not tender pressure L parasternal joints, Abd- Br/ Gen/ Rectal- Not done, not indicated Extrem- cyanosis- none, clubbing, none, atrophy- none, strength- nl Neuro- grossly intact to  observation

## 2013-10-23 NOTE — Patient Instructions (Signed)
Order- CXR   Dx Bronchiectasis              Lab- CBC w diff

## 2013-10-24 DIAGNOSIS — J441 Chronic obstructive pulmonary disease with (acute) exacerbation: Secondary | ICD-10-CM | POA: Insufficient documentation

## 2013-10-24 NOTE — Assessment & Plan Note (Signed)
Current L parasternal pain c/w musculoskeletal pain Plan- NSAIDs, local heat, CXR

## 2013-10-24 NOTE — Assessment & Plan Note (Signed)
Discussed options, consider re-treat Plan CXR, CBC

## 2013-10-24 NOTE — Assessment & Plan Note (Signed)
Chronic or recurrent MAIC w/o radiologic progression or recent heme. Plan- CXR, CBC

## 2013-10-24 NOTE — Assessment & Plan Note (Signed)
No recent blood

## 2013-10-25 ENCOUNTER — Other Ambulatory Visit (INDEPENDENT_AMBULATORY_CARE_PROVIDER_SITE_OTHER): Payer: Medicare Other

## 2013-10-25 DIAGNOSIS — J479 Bronchiectasis, uncomplicated: Secondary | ICD-10-CM

## 2013-10-25 LAB — CBC WITH DIFFERENTIAL/PLATELET
Basophils Absolute: 0 10*3/uL (ref 0.0–0.1)
Basophils Relative: 0.6 % (ref 0.0–3.0)
EOS ABS: 0.1 10*3/uL (ref 0.0–0.7)
Eosinophils Relative: 0.9 % (ref 0.0–5.0)
HCT: 38.6 % (ref 36.0–46.0)
HEMOGLOBIN: 12.9 g/dL (ref 12.0–15.0)
LYMPHS PCT: 42.3 % (ref 12.0–46.0)
Lymphs Abs: 3 10*3/uL (ref 0.7–4.0)
MCHC: 33.3 g/dL (ref 30.0–36.0)
MCV: 94.8 fl (ref 78.0–100.0)
MONOS PCT: 7.9 % (ref 3.0–12.0)
Monocytes Absolute: 0.6 10*3/uL (ref 0.1–1.0)
NEUTROS ABS: 3.4 10*3/uL (ref 1.4–7.7)
NEUTROS PCT: 48.3 % (ref 43.0–77.0)
Platelets: 198 10*3/uL (ref 150.0–400.0)
RBC: 4.07 Mil/uL (ref 3.87–5.11)
RDW: 13.3 % (ref 11.5–15.5)
WBC: 7 10*3/uL (ref 4.0–10.5)

## 2013-12-19 ENCOUNTER — Ambulatory Visit: Payer: Medicare Other | Admitting: Internal Medicine

## 2013-12-26 ENCOUNTER — Ambulatory Visit: Payer: PRIVATE HEALTH INSURANCE | Admitting: Internal Medicine

## 2014-01-12 ENCOUNTER — Other Ambulatory Visit: Payer: Self-pay | Admitting: Internal Medicine

## 2014-01-29 ENCOUNTER — Ambulatory Visit: Payer: PRIVATE HEALTH INSURANCE | Admitting: Internal Medicine

## 2014-03-01 IMAGING — CR DG LUMBAR SPINE 2-3V
3 series · 3 of 3 positions shown · non-contrast
Comparison: None.

CLINICAL DATA: Fell 1 week ago, back pain

LUMBAR SPINE - 2-3 VIEW

[view not recorded (1 of 3)]
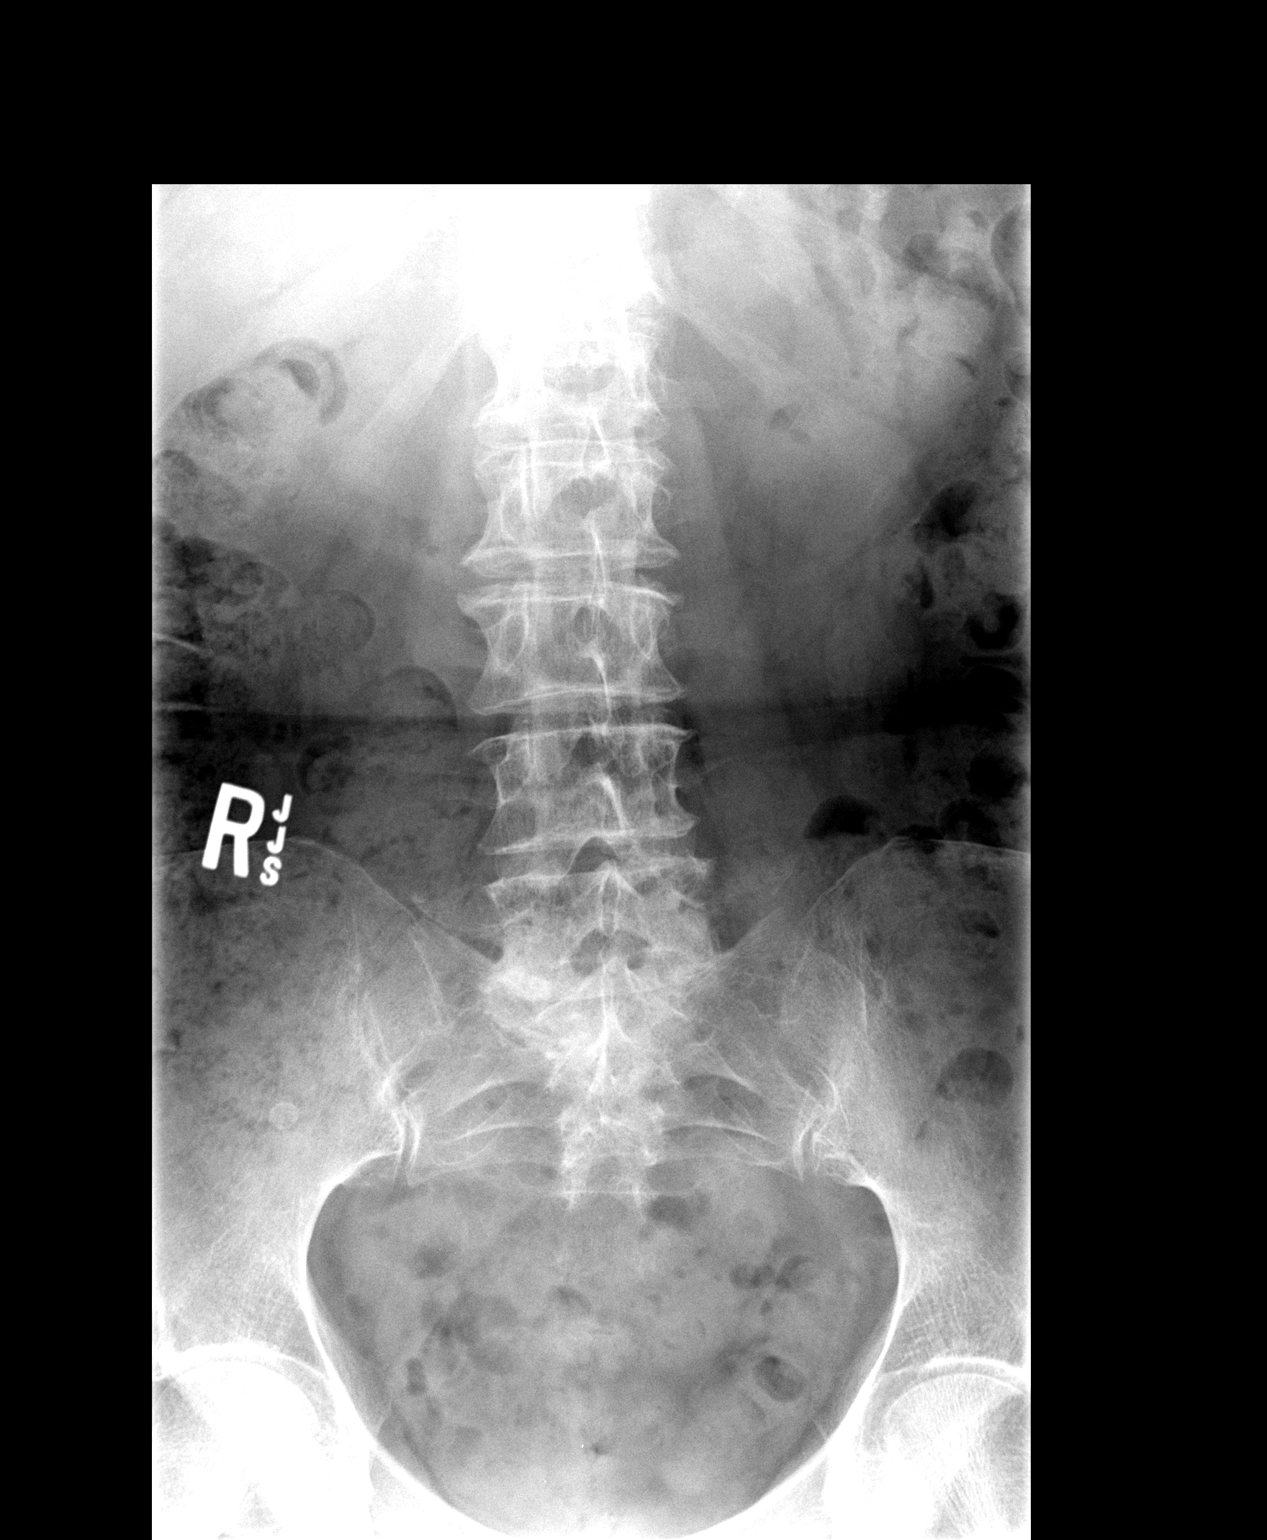

[view not recorded (2 of 3)]
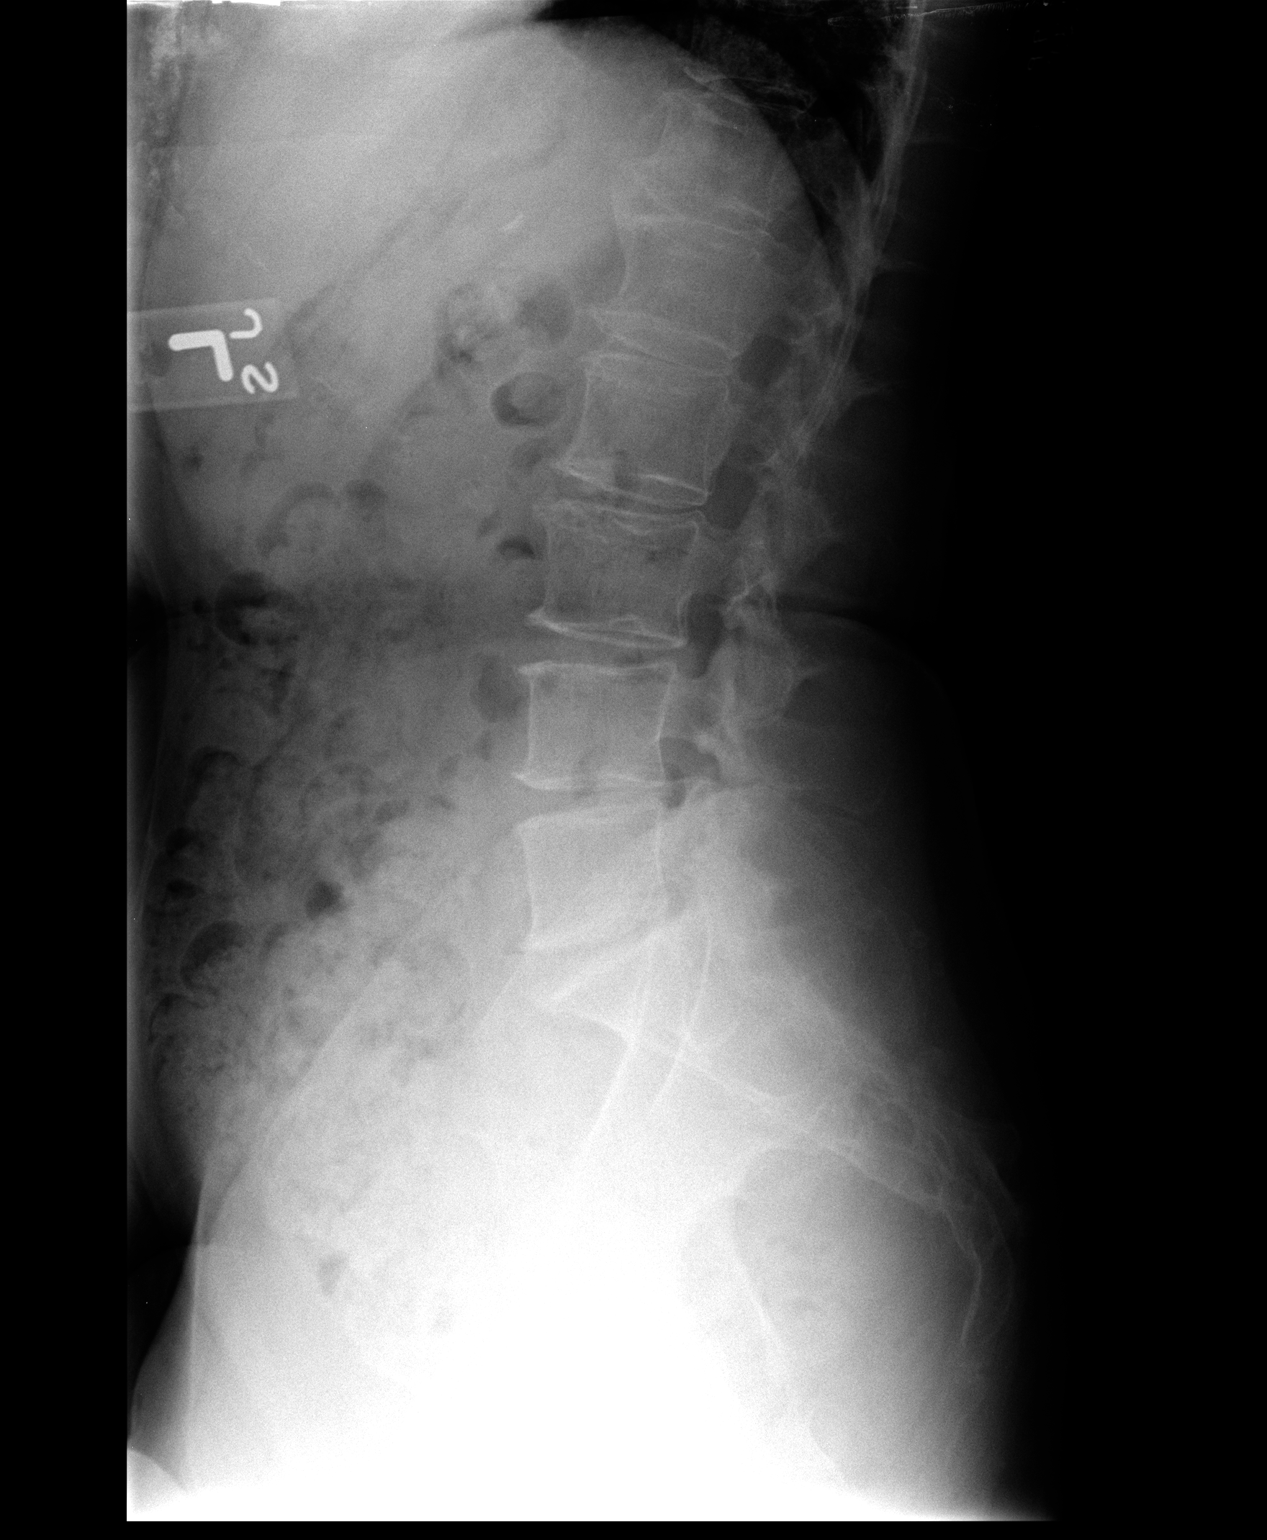

[view not recorded (3 of 3)]
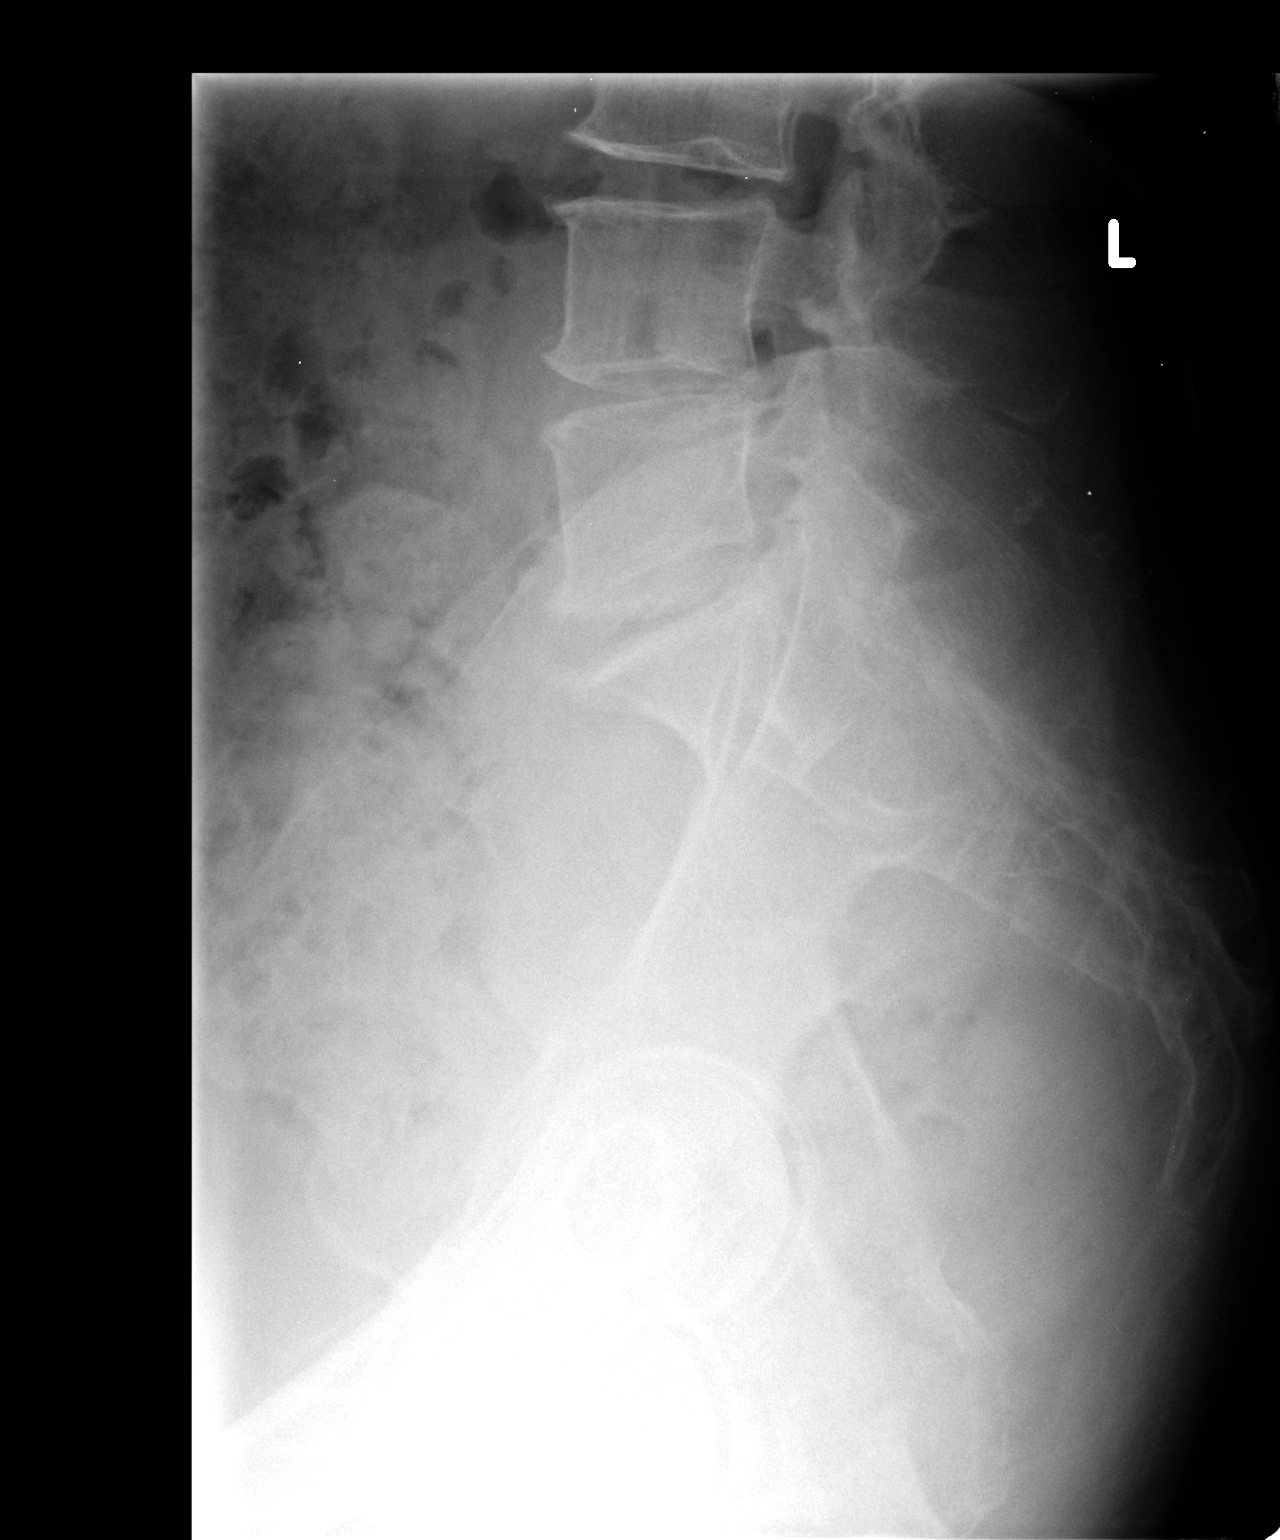

[3 of 3 positions shown; findings below may reference images not displayed]

FINDINGS: There is no visible lumbar compression fracture or
traumatic subluxation.  Disc space narrowing is noted at L5-S1.
Alignment is anatomic.  Lower lumbar facet arthropathy is seen.  No
definite lower thoracic compression fractures.
IMPRESSION: Degenerative change as described.  No acute findings are evident.

## 2014-03-15 ENCOUNTER — Ambulatory Visit: Payer: Medicare Other | Admitting: Internal Medicine

## 2014-03-15 ENCOUNTER — Encounter: Payer: Self-pay | Admitting: Internal Medicine

## 2014-03-15 ENCOUNTER — Encounter (INDEPENDENT_AMBULATORY_CARE_PROVIDER_SITE_OTHER): Payer: Self-pay

## 2014-03-15 VITALS — BP 108/80 | HR 67 | Ht 63.0 in | Wt 120.2 lb

## 2014-03-15 DIAGNOSIS — Z23 Encounter for immunization: Secondary | ICD-10-CM

## 2014-03-15 NOTE — Progress Notes (Signed)
Patient ID: Marilyn Blake, female    DOB: 06-Mar-1941, 73 y.o.   MRN: [id number]  HPI 02/03/2011-73 year old female never smoker followed for bronchiectasis, history of MAIC bronchitis, allergic rhinitis. Last here 08/04/2010 Chest x-ray in September of 2011 had shown bilateral scarring, bronchiectasis in the mid zones bilaterally CXR 08/04/2010-stable bronchiectasis, scarring, micro-nodularities in the small area of cavitary change in the right upper lobe consistent with chronic Mycobacterium infection. Acute visit-2 days of chills or lays temperature 100.8 yesterday, sore throat and nasal congestion. Cough is productive of some yellow sputum. No GI complaint.  06/16/11- 73 year old female never smoker followed for bronchiectasis, history of MAIC bronchitis, allergic rhinitis, insomnia. Her breathing has been comfortable. Not much daily cough. More easily winded with activities of daily living over the last 2 weeks but nothing very specific. A few vague twinge pains high in the left upper chest without palpitation or swelling. She has had to have a lot of dental work this winter and has often been uncomfortable from that and her age-related arthritis complaints. As a result she is not sleeping as well. She has taken occasional diazepam, usually one half tablet. We discussed alternatives including a short half-life medication that would be out of her system in the morning.  10/13/11- 73 year old female never smoker followed for bronchiectasis, history of MAIC bronchitis, allergic rhinitis, insomnia. Gets more winded than usual with activity-states she is very active overall. Last weekend had episode of coughing up blood(rich red blood) and then stopped-no more episodes since(Had SOB with it as well)  CXR 06/23/11- 73 year old female never smoker followed for bronchiectasis, history of MAIC bronchitis, allergic rhinitis, insomnia. Gradually more short of breath without increased cough. Notices it   especially after a day of yard work Did cough up some red blood mixed in clear phlegm,  one time, a week or more ago. Some pain left upper anterior chest sounds pleuritic. Few night sweats. CXR- 07/13/11- reviewed images with her IMPRESSION:  Stable chronic lung disease. No acute cardiopulmonary process.  Original Report Authenticated By: Vivia Ewing, M.D.   02/15/12-  73 year old female never smoker followed for bronchiectasis, history of MAIC bronchitis, allergic rhinitis, insomnia.  Pt states breathing has improved since last visit. She has had no more hemoptysis since last visit. Cough is prod with minimal light green sputum.  Good summer. No blood since last year. Seasonal sneezing and blowing. Husband being treated for colon cancer.  01/18/13-  73 year old female never smoker followed for bronchiectasis, history of MAIC bronchitis, allergic rhinitis, insomnia. FOLLOWS FOR: Chest pain, wheezing, SOB and cough w/mucus yellow in color. Some f/c/s beginning last p.m worsened More cough last week. BR 11/15/2012 for chest pain negative. Last night cough, soreness left upper anterior chest with low-grade fever and sweat. Takes Prilosec and not aware of reflux. Feels run down. Husband dealing with colorectal cancer which is stress for them. CXR 11/15/12 IMPRESSION:  Chronic opacities with bronchiectasis, most prominent in the right  middle lobe and lingula, suspicious for atypical infection such as  MAI.  No definite superimposed opacities suspicious for pneumonia.  Original Report Authenticated By: Julian Hy, M.D.  10/23/13-73 year old female never smoker followed for bronchiectasis, recurrent MAIC bronchitis, allergic rhinitis, insomnia. FOLLOWS FOR: SOB and left sided CP since lat OV. Has been under more stress as well 3 weeks increased DOE hills, dry cough, night sweats. L parasternal soreness with reaching or deep breath.  Sputum cx 9 4/14 + MAIC, neg routine flora, neg  fungi  03/15/14-  73 year old female never smoker followed for bronchiectasis, recurrent MAIC bronchitis, allergic rhinitis, insomnia. FOLLOW FOR: Patient stilll has some sob,cough and runny nose. She has had headache   CXR 10/23/13 IMPRESSION:  There are stable changes of COPD. Atelectasis versus scarring in the  lingula and right middle lobe is present and stable.  Electronically Signed  By: David Martinique  On: 10/23/2013 13:29     Review of Systems- see HPI Constitutional:   No-   weight loss, +night sweats, , chills, +fatigue, lassitude. HEENT:   No-  headaches, difficulty swallowing, tooth/dental problems, sore throat,       No-  sneezing, itching, ear ache, +nasal congestion, post nasal drip,  CV:  +atypicalchest pain, orthopnea, PND, swelling in lower extremities, anasarca,dizziness, palpitations Resp: + shortness of breath with exertion or at rest.              No-   productive cough,  + non-productive cough,  no- coughing up of blood.              No- change in color of mucus.  No- wheezing.   Skin: No-   rash or lesions. GI:  No-   heartburn, indigestion, abdominal pain, nausea, vomiting,  GU:  MS:  No-   joint pain or swelling. . Neuro- grossly normal to observation,   Psych:  No- change in mood or affect. No depression or anxiety.  No memory loss.  Objective:   Physical Exam General- Alert, Oriented, Affect-appropriate, Distress- none acute, petite lady Skin- rash-none, lesions- none, excoriation- none Lymphadenopathy- none Head- atraumatic            Eyes- Gross vision intact, PERRLA, conjunctivae clear secretions            Ears- Hearing, canals- normal            Nose- Clear, No-Septal dev, mucus, polyps, erosion, perforation             Throat- Mallampati II , mucosa clear- not red , drainage- none, tonsils- atrophic, Neck- flexible , trachea midline, no stridor , thyroid nl, carotid no bruit Chest - symmetrical excursion , unlabored           Heart/CV- RRR , no  murmur , no gallop  , no rub, nl s1 s2                           - JVD- none , edema- none, stasis changes- none, varices- none           Lung- +trace crackles, unlabored, wheeze- none, cough- none , dullness-none, rub-                           none           Chest wall- Not tender pressure L parasternal joints, Abd- Br/ Gen/ Rectal- Not done, not indicated Extrem- cyanosis- none, clubbing, none, atrophy- none, strength- nl Neuro- grossly intact to observation

## 2014-03-15 NOTE — Patient Instructions (Signed)
Flu vax  Please call as needed  

## 2014-03-27 ENCOUNTER — Other Ambulatory Visit (INDEPENDENT_AMBULATORY_CARE_PROVIDER_SITE_OTHER): Payer: Medicare Other

## 2014-03-27 ENCOUNTER — Encounter: Payer: Self-pay | Admitting: Internal Medicine

## 2014-03-27 ENCOUNTER — Ambulatory Visit (INDEPENDENT_AMBULATORY_CARE_PROVIDER_SITE_OTHER): Payer: Medicare Other | Admitting: Internal Medicine

## 2014-03-27 VITALS — BP 108/66 | HR 66 | Temp 97.8°F | Ht 63.0 in | Wt 116.0 lb

## 2014-03-27 DIAGNOSIS — R5383 Other fatigue: Secondary | ICD-10-CM | POA: Diagnosis not present

## 2014-03-27 DIAGNOSIS — E890 Postprocedural hypothyroidism: Secondary | ICD-10-CM

## 2014-03-27 DIAGNOSIS — E785 Hyperlipidemia, unspecified: Secondary | ICD-10-CM | POA: Diagnosis not present

## 2014-03-27 DIAGNOSIS — Z23 Encounter for immunization: Secondary | ICD-10-CM

## 2014-03-27 DIAGNOSIS — Z0000 Encounter for general adult medical examination without abnormal findings: Secondary | ICD-10-CM | POA: Diagnosis not present

## 2014-03-27 LAB — LIPID PANEL
CHOL/HDL RATIO: 4
Cholesterol: 203 mg/dL — ABNORMAL HIGH (ref 0–200)
HDL: 46 mg/dL (ref >39.00)
LDL Cholesterol: 136 mg/dL — ABNORMAL HIGH (ref 0–99)
NonHDL: 157
Triglycerides: 107 mg/dL (ref 0.0–149.0)
VLDL: 21.4 mg/dL (ref 0.0–40.0)

## 2014-03-27 LAB — HEPATIC FUNCTION PANEL
ALBUMIN: 3.8 g/dL (ref 3.5–5.2)
ALT: 19 U/L (ref 0–35)
AST: 27 U/L (ref 0–37)
Alkaline Phosphatase: 90 U/L (ref 39–117)
BILIRUBIN DIRECT: 0.1 mg/dL (ref 0.0–0.3)
TOTAL PROTEIN: 8 g/dL (ref 6.0–8.3)
Total Bilirubin: 0.5 mg/dL (ref 0.2–1.2)

## 2014-03-27 LAB — CBC WITH DIFFERENTIAL/PLATELET
BASOS PCT: 0.3 % (ref 0.0–3.0)
Basophils Absolute: 0 10*3/uL (ref 0.0–0.1)
EOS ABS: 0.1 10*3/uL (ref 0.0–0.7)
EOS PCT: 0.8 % (ref 0.0–5.0)
HCT: 41.4 % (ref 36.0–46.0)
Hemoglobin: 13.6 g/dL (ref 12.0–15.0)
Lymphocytes Relative: 43 % (ref 12.0–46.0)
Lymphs Abs: 2.8 10*3/uL (ref 0.7–4.0)
MCHC: 32.9 g/dL (ref 30.0–36.0)
MCV: 94.5 fl (ref 78.0–100.0)
MONO ABS: 0.5 10*3/uL (ref 0.1–1.0)
Monocytes Relative: 7.3 % (ref 3.0–12.0)
NEUTROS ABS: 3.2 10*3/uL (ref 1.4–7.7)
NEUTROS PCT: 48.6 % (ref 43.0–77.0)
Platelets: 191 10*3/uL (ref 150.0–400.0)
RBC: 4.38 Mil/uL (ref 3.87–5.11)
RDW: 13.3 % (ref 11.5–15.5)
WBC: 6.6 10*3/uL (ref 4.0–10.5)

## 2014-03-27 LAB — BASIC METABOLIC PANEL
BUN: 14 mg/dL (ref 6–23)
CHLORIDE: 102 meq/L (ref 96–112)
CO2: 34 meq/L — AB (ref 19–32)
CREATININE: 0.7 mg/dL (ref 0.4–1.2)
Calcium: 9.5 mg/dL (ref 8.4–10.5)
GFR: 86.76 mL/min (ref >60.00)
Glucose, Bld: 98 mg/dL (ref 70–99)
POTASSIUM: 3.9 meq/L (ref 3.5–5.1)
SODIUM: 140 meq/L (ref 135–145)

## 2014-03-27 LAB — TSH: TSH: 3.41 u[IU]/mL (ref 0.35–4.50)

## 2014-03-27 MED ORDER — LEVOTHYROXINE SODIUM 75 MCG PO TABS: ORAL_TABLET | ORAL | Status: DC

## 2014-03-27 MED ORDER — DIAZEPAM 5 MG PO TABS: 5.0000 mg | ORAL_TABLET | Freq: Two times a day (BID) | ORAL | Status: DC | PRN

## 2014-03-27 MED ORDER — LEVOTHYROXINE SODIUM 50 MCG PO TABS: ORAL_TABLET | ORAL | Status: DC

## 2014-03-27 MED ORDER — SIMVASTATIN 40 MG PO TABS: 20.0000 mg | ORAL_TABLET | Freq: Every day | ORAL | Status: DC

## 2014-03-27 NOTE — Progress Notes (Signed)
Pre visit review using our clinic review tool, if applicable. No additional management support is needed unless otherwise documented below in the visit note. 

## 2014-03-27 NOTE — Assessment & Plan Note (Signed)
On simva - last lipids reviewed, check annually The current medical regimen is effective;  continue present plan and medications.

## 2014-03-27 NOTE — Progress Notes (Signed)
Subjective:    Patient ID: Marilyn Blake, female    DOB: 01/16/1941, 73 y.o.   MRN: [id number]  HPI   Here for medicare wellness  Diet: heart healthy  Physical activity: sedentary Depression/mood screen: negative Hearing: intact to whispered voice Visual acuity: grossly normal, performs annual eye exam  ADLs: capable Fall risk: none Home safety: good Cognitive evaluation: intact to orientation, naming, recall and repetition EOL planning: adv directives, full code/ I agree  I have personally reviewed and have noted 1. The patient's medical and social history 2. Their use of alcohol, tobacco or illicit drugs 3. Their current medications and supplements 4. The patient's functional ability including ADL's, fall risks, home safety risks and hearing or visual impairment. 5. Diet and physical activities 6. Evidence for depression or mood disorders  Also reviewed chronic medical issues and interval medical events  Past Medical History  Diagnosis Date  . ALLERGIC RHINITIS   . ANXIETY   . BACTEREMIA, MYCOBACTERIUM AVIUM COMPLEX   . BRONCHIECTASIS   . COLONIC POLYPS, HX OF   . GERD   . GOUT   . HYPERLIPIDEMIA   . HYPOTHYROIDISM   . Irritable bowel syndrome   . OSTEOPENIA    Family History  Problem Relation Age of Onset  . Cancer Mother 66    cervical cancer, died age 57  . Heart attack Father 48   History  Substance Use Topics  . Smoking status: Never Smoker   . Smokeless tobacco: Not on file     Comment: Married, Housewife  . Alcohol Use: No    Review of Systems  Constitutional: Positive for fatigue. Negative for fever and unexpected weight change.  Respiratory: Negative for cough, shortness of breath and wheezing.   Cardiovascular: Negative for chest pain, palpitations and leg swelling.  Gastrointestinal: Positive for constipation (chronic). Negative for nausea, abdominal pain, diarrhea and abdominal distention.  Neurological: Negative for dizziness, weakness,  light-headedness and headaches.  Psychiatric/Behavioral: Negative for dysphoric mood. The patient is not nervous/anxious.   All other systems reviewed and are negative.      Objective:   Physical Exam  BP 108/66 mmHg  Pulse 66  Temp(Src) 97.8 F (36.6 C) (Oral)  Ht 5\' 3"  (1.6 m)  Wt 116 lb (52.617 kg)  BMI 20.55 kg/m2  SpO2 95%  LMP 08/10/2013 Wt Readings from Last 3 Encounters:  03/27/14 116 lb (52.617 kg)  03/15/14 120 lb 3.2 oz (54.522 kg)  10/23/13 116 lb 9.6 oz (52.889 kg)   Constitutional: She appears well-developed and well-nourished. No distress.  Neck: Normal range of motion. Neck supple. No JVD present. No thyromegaly present.  Cardiovascular: Normal rate, regular rhythm and normal heart sounds.  No murmur heard. No BLE edema. Pulmonary/Chest: Effort normal and breath sounds normal. No respiratory distress. She has no wheezes.  Psychiatric: She has a normal mood and affect. Her behavior is normal. Judgment and thought content normal.   Lab Results  Component Value Date   WBC 7.0 10/25/2013   HGB 12.9 10/25/2013   HCT 38.6 10/25/2013   PLT 198.0 10/25/2013   GLUCOSE 100* 11/15/2012   CHOL 201* 01/18/2013   TRIG 160.0* 01/18/2013   HDL 48.30 01/18/2013   LDLDIRECT 121.7 01/18/2013   LDLCALC 109* 01/14/2010   ALT 15 11/15/2012   AST 27 11/15/2012   NA 139 11/15/2012   K 3.8 11/15/2012   CL 101 11/15/2012   CREATININE 0.83 11/15/2012   BUN 12 11/15/2012   CO2 27 11/15/2012  TSH 4.99 06/08/2012   INR 1.04 11/15/2012    Dg Chest 2 View  10/23/2013   CLINICAL DATA:  Cough shortness of breath chest pain and dyspnea on exertion  EXAM: CHEST  2 VIEW  COMPARISON:  Chest x-ray dated January 18, 2013  FINDINGS: The lungs are mildly hyperinflated. Stable coarse perihilar interstitial markings are present in the right middle lobe and lingula. The heart is top-normal in size. The trachea is midline. There is no pleural effusion or pneumothorax. The thoracic spine  exhibits mild diffuse degenerative disc change.  IMPRESSION: There are stable changes of COPD. Atelectasis versus scarring in the lingula and right middle lobe is present and stable.   Electronically Signed   By: David  Martinique   On: 10/23/2013 13:29       Assessment & Plan:   AWV/z00.00 - Today patient counseled on age appropriate routine health concerns for screening and prevention, each reviewed and up to date or declined. Immunizations reviewed and up to date or declined. Labs reviewed. Risk factors for depression reviewed and negative. Hearing function and visual acuity are intact. ADLs screened and addressed as needed. Functional ability and level of safety reviewed and appropriate. Education, counseling and referrals performed based on assessed risks today. Patient provided with a copy of personalized plan for preventive services.  Fatigue - nonspecific symptoms/exam - check screening labs  Problem List Items Addressed This Visit    Hyperlipidemia    On simva - last lipids reviewed, check annually The current medical regimen is effective;  continue present plan and medications.     Hypothyroidism    Alt 50 qod with 75 qod S/p thyroidectomy in 1998 Check tsh now and adjust as needed Lab Results  Component Value Date   TSH 4.99 06/08/2012       Other Visit Diagnoses    Routine general medical examination at a health care facility    -  Primary    Other fatigue

## 2014-03-27 NOTE — Patient Instructions (Addendum)
It was good to see you today.  We have reviewed your prior records including labs and tests today  Prevnar pneumonia vaccine administered today  Other Health Maintenance reviewed - Consider shingles vaccination as discussed. All other recommended immunizations and age-appropriate screenings are up-to-date.  Test(s) ordered today. Your results will be released to [facility] (or called to you) after review, usually within 72hours after test completion. If any changes need to be made, you will be notified at that same time.  Medications reviewed and updated, no changes recommended at this time. Refill on medication(s) as discussed today.  Please schedule followup in 12 months for annual exam and labs, call sooner if problems.  Health Maintenance Adopting a healthy lifestyle and getting preventive care can go a long way to promote health and wellness. Talk with your health care provider about what schedule of regular examinations is right for you. This is a good chance for you to check in with your provider about disease prevention and staying healthy. In between checkups, there are plenty of things you can do on your own. Experts have done a lot of research about which lifestyle changes and preventive measures are most likely to keep you healthy. Ask your health care provider for more information. WEIGHT AND DIET  Eat a healthy diet  Be sure to include plenty of vegetables, fruits, low-fat dairy products, and lean protein.  Do not eat a lot of foods high in solid fats, added sugars, or salt.  Get regular exercise. This is one of the most important things you can do for your health.  Most adults should exercise for at least 150 minutes each week. The exercise should increase your heart rate and make you sweat (moderate-intensity exercise).  Most adults should also do strengthening exercises at least twice a week. This is in addition to the moderate-intensity exercise.  Maintain a healthy  weight  Body mass index (BMI) is a measurement that can be used to identify possible weight problems. It estimates body fat based on height and weight. Your health care provider can help determine your BMI and help you achieve or maintain a healthy weight.  For females 56 years of age and older:   A BMI below 18.5 is considered underweight.  A BMI of 18.5 to 24.9 is normal.  A BMI of 25 to 29.9 is considered overweight.  A BMI of 30 and above is considered obese.  Watch levels of cholesterol and blood lipids  You should start having your blood tested for lipids and cholesterol at 74 years of age, then have this test every 5 years.  You may need to have your cholesterol levels checked more often if:  Your lipid or cholesterol levels are high.  You are older than 74 years of age.  You are at high risk for heart disease.  CANCER SCREENING   Lung Cancer  Lung cancer screening is recommended for adults 33-75 years old who are at high risk for lung cancer because of a history of smoking.  A yearly low-dose CT scan of the lungs is recommended for people who:  Currently smoke.  Have quit within the past 15 years.  Have at least a 30-pack-year history of smoking. A pack year is smoking an average of one pack of cigarettes a day for 1 year.  Yearly screening should continue until it has been 15 years since you quit.  Yearly screening should stop if you develop a health problem that would prevent you from having  lung cancer treatment.  Breast Cancer  Practice breast self-awareness. This means understanding how your breasts normally appear and feel.  It also means doing regular breast self-exams. Let your health care provider know about any changes, no matter how small.  If you are in your 20s or 30s, you should have a clinical breast exam (CBE) by a health care provider every 1-3 years as part of a regular health exam.  If you are 41 or older, have a CBE every year. Also  consider having a breast X-ray (mammogram) every year.  If you have a family history of breast cancer, talk to your health care provider about genetic screening.  If you are at high risk for breast cancer, talk to your health care provider about having an MRI and a mammogram every year.  Breast cancer gene (BRCA) assessment is recommended for women who have family members with BRCA-related cancers. BRCA-related cancers include:  Breast.  Ovarian.  Tubal.  Peritoneal cancers.  Results of the assessment will determine the need for genetic counseling and BRCA1 and BRCA2 testing. Cervical Cancer Routine pelvic examinations to screen for cervical cancer are no longer recommended for nonpregnant women who are considered low risk for cancer of the pelvic organs (ovaries, uterus, and vagina) and who do not have symptoms. A pelvic examination may be necessary if you have symptoms including those associated with pelvic infections. Ask your health care provider if a screening pelvic exam is right for you.   The Pap test is the screening test for cervical cancer for women who are considered at risk.  If you had a hysterectomy for a problem that was not cancer or a condition that could lead to cancer, then you no longer need Pap tests.  If you are older than 65 years, and you have had normal Pap tests for the past 10 years, you no longer need to have Pap tests.  If you have had past treatment for cervical cancer or a condition that could lead to cancer, you need Pap tests and screening for cancer for at least 20 years after your treatment.  If you no longer get a Pap test, assess your risk factors if they change (such as having a new sexual partner). This can affect whether you should start being screened again.  Some women have medical problems that increase their chance of getting cervical cancer. If this is the case for you, your health care provider may recommend more frequent screening and Pap  tests.  The human papillomavirus (HPV) test is another test that may be used for cervical cancer screening. The HPV test looks for the virus that can cause cell changes in the cervix. The cells collected during the Pap test can be tested for HPV.  The HPV test can be used to screen women 20 years of age and older. Getting tested for HPV can extend the interval between normal Pap tests from three to five years.  An HPV test also should be used to screen women of any age who have unclear Pap test results.  After 74 years of age, women should have HPV testing as often as Pap tests.  Colorectal Cancer  This type of cancer can be detected and often prevented.  Routine colorectal cancer screening usually begins at 74 years of age and continues through 75 years of age.  Your health care provider may recommend screening at an earlier age if you have risk factors for colon cancer.  Your health care provider  may also recommend using home test kits to check for hidden blood in the stool.  A small camera at the end of a tube can be used to examine your colon directly (sigmoidoscopy or colonoscopy). This is done to check for the earliest forms of colorectal cancer.  Routine screening usually begins at age 59.  Direct examination of the colon should be repeated every 5-10 years through 74 years of age. However, you may need to be screened more often if early forms of precancerous polyps or small growths are found. Skin Cancer  Check your skin from head to toe regularly.  Tell your health care provider about any new moles or changes in moles, especially if there is a change in a mole's shape or color.  Also tell your health care provider if you have a mole that is larger than the size of a pencil eraser.  Always use sunscreen. Apply sunscreen liberally and repeatedly throughout the day.  Protect yourself by wearing long sleeves, pants, a wide-brimmed hat, and sunglasses whenever you are  outside. HEART DISEASE, DIABETES, AND HIGH BLOOD PRESSURE   Have your blood pressure checked at least every 1-2 years. High blood pressure causes heart disease and increases the risk of stroke.  If you are between 48 years and 75 years old, ask your health care provider if you should take aspirin to prevent strokes.  Have regular diabetes screenings. This involves taking a blood sample to check your fasting blood sugar level.  If you are at a normal weight and have a low risk for diabetes, have this test once every three years after 74 years of age.  If you are overweight and have a high risk for diabetes, consider being tested at a younger age or more often. PREVENTING INFECTION  Hepatitis B  If you have a higher risk for hepatitis B, you should be screened for this virus. You are considered at high risk for hepatitis B if:  You were born in a country where hepatitis B is common. Ask your health care provider which countries are considered high risk.  Your parents were born in a high-risk country, and you have not been immunized against hepatitis B (hepatitis B vaccine).  You have HIV or AIDS.  You use needles to inject street drugs.  You live with someone who has hepatitis B.  You have had sex with someone who has hepatitis B.  You get hemodialysis treatment.  You take certain medicines for conditions, including cancer, organ transplantation, and autoimmune conditions. Hepatitis C  Blood testing is recommended for:  Everyone born from [birth year] through 1966.  Anyone with known risk factors for hepatitis C. Sexually transmitted infections (STIs)  You should be screened for sexually transmitted infections (STIs) including gonorrhea and chlamydia if:  You are sexually active and are younger than 74 years of age.  You are older than 74 years of age and your health care provider tells you that you are at risk for this type of infection.  Your sexual activity has changed since  you were last screened and you are at an increased risk for chlamydia or gonorrhea. Ask your health care provider if you are at risk.  If you do not have HIV, but are at risk, it may be recommended that you take a prescription medicine daily to prevent HIV infection. This is called pre-exposure prophylaxis (PrEP). You are considered at risk if:  You are sexually active and do not regularly use condoms or know the  HIV status of your partner(s).  You take drugs by injection.  You are sexually active with a partner who has HIV. Talk with your health care provider about whether you are at high risk of being infected with HIV. If you choose to begin PrEP, you should first be tested for HIV. You should then be tested every 3 months for as long as you are taking PrEP.  PREGNANCY   If you are premenopausal and you may become pregnant, ask your health care provider about preconception counseling.  If you may become pregnant, take 400 to 800 micrograms (mcg) of folic acid every day.  If you want to prevent pregnancy, talk to your health care provider about birth control (contraception). OSTEOPOROSIS AND MENOPAUSE   Osteoporosis is a disease in which the bones lose minerals and strength with aging. This can result in serious bone fractures. Your risk for osteoporosis can be identified using a bone density scan.  If you are 27 years of age or older, or if you are at risk for osteoporosis and fractures, ask your health care provider if you should be screened.  Ask your health care provider whether you should take a calcium or vitamin D supplement to lower your risk for osteoporosis.  Menopause may have certain physical symptoms and risks.  Hormone replacement therapy may reduce some of these symptoms and risks. Talk to your health care provider about whether hormone replacement therapy is right for you.  HOME CARE INSTRUCTIONS   Schedule regular health, dental, and eye exams.  Stay current with  your immunizations.   Do not use any tobacco products including cigarettes, chewing tobacco, or electronic cigarettes.  If you are pregnant, do not drink alcohol.  If you are breastfeeding, limit how much and how often you drink alcohol.  Limit alcohol intake to no more than 1 drink per day for nonpregnant women. One drink equals 12 ounces of beer, 5 ounces of wine, or 1 ounces of hard liquor.  Do not use street drugs.  Do not share needles.  Ask your health care provider for help if you need support or information about quitting drugs.  Tell your health care provider if you often feel depressed.  Tell your health care provider if you have ever been abused or do not feel safe at home. Document Released: 11/17/2010 Document Revised: 09/18/2013 Document Reviewed: 04/05/2013 [facility] Patient Information 2015 [place], [place]. This information is not intended to replace advice given to you by your health care provider. Make sure you discuss any questions you have with your health care provider.

## 2014-03-27 NOTE — Assessment & Plan Note (Signed)
Alt 50 qod with 75 qod S/p thyroidectomy in 1998 Check tsh now and adjust as needed Lab Results  Component Value Date   TSH 4.99 06/08/2012

## 2014-04-03 ENCOUNTER — Other Ambulatory Visit: Payer: Self-pay

## 2014-04-03 DIAGNOSIS — Z1231 Encounter for screening mammogram for malignant neoplasm of breast: Secondary | ICD-10-CM

## 2014-04-25 ENCOUNTER — Ambulatory Visit
Admission: RE | Admit: 2014-04-25 | Discharge: 2014-04-25 | Disposition: A | Discharge status: Home / Self Care | Payer: Medicare Other | Source: Ambulatory Visit

## 2014-04-25 DIAGNOSIS — Z1231 Encounter for screening mammogram for malignant neoplasm of breast: Secondary | ICD-10-CM | POA: Diagnosis not present

## 2014-09-03 DIAGNOSIS — R51 Headache: Secondary | ICD-10-CM | POA: Diagnosis not present

## 2014-09-14 ENCOUNTER — Ambulatory Visit: Payer: Medicare Other | Admitting: Internal Medicine

## 2014-09-26 ENCOUNTER — Other Ambulatory Visit: Payer: Self-pay | Admitting: Internal Medicine

## 2014-10-05 ENCOUNTER — Telehealth: Payer: Self-pay | Admitting: Internal Medicine

## 2014-10-05 MED ORDER — OMEPRAZOLE 20 MG PO CPDR: 20.0000 mg | DELAYED_RELEASE_CAPSULE | Freq: Every day | ORAL | Status: DC

## 2014-10-05 NOTE — Telephone Encounter (Signed)
Patient is requesting refill for omeprazole (PRILOSEC) 20 MG capsule [[id number]. She says Dr. Asa Lente knows she takes this and should fill this for her. Pharmacy is [facility] in [place]

## 2014-10-08 DIAGNOSIS — H2513 Age-related nuclear cataract, bilateral: Secondary | ICD-10-CM | POA: Diagnosis not present

## 2014-10-08 DIAGNOSIS — H5203 Hypermetropia, bilateral: Secondary | ICD-10-CM | POA: Diagnosis not present

## 2014-10-08 DIAGNOSIS — H52223 Regular astigmatism, bilateral: Secondary | ICD-10-CM | POA: Diagnosis not present

## 2014-10-08 DIAGNOSIS — H04123 Dry eye syndrome of bilateral lacrimal glands: Secondary | ICD-10-CM | POA: Diagnosis not present

## 2014-10-08 DIAGNOSIS — H524 Presbyopia: Secondary | ICD-10-CM | POA: Diagnosis not present

## 2014-11-27 ENCOUNTER — Telehealth: Payer: Self-pay | Admitting: Gastroenterology

## 2014-11-27 NOTE — Telephone Encounter (Signed)
Patient reports constipation and rectal pain for over a week. She is straining hard to have a bowel movement. The rectal pain is interfering with her sleep. Symptoms are getting worse. Appointment made for her.

## 2014-11-29 ENCOUNTER — Ambulatory Visit (INDEPENDENT_AMBULATORY_CARE_PROVIDER_SITE_OTHER): Payer: Medicare Other | Admitting: Physician Assistant

## 2014-11-29 ENCOUNTER — Encounter: Payer: Self-pay | Admitting: Physician Assistant

## 2014-11-29 VITALS — BP 120/70 | HR 80 | Ht 61.0 in | Wt 114.4 lb

## 2014-11-29 DIAGNOSIS — K219 Gastro-esophageal reflux disease without esophagitis: Secondary | ICD-10-CM

## 2014-11-29 DIAGNOSIS — K6289 Other specified diseases of anus and rectum: Secondary | ICD-10-CM | POA: Diagnosis not present

## 2014-11-29 DIAGNOSIS — R1013 Epigastric pain: Secondary | ICD-10-CM | POA: Diagnosis not present

## 2014-11-29 DIAGNOSIS — K602 Anal fissure, unspecified: Secondary | ICD-10-CM | POA: Diagnosis not present

## 2014-11-29 MED ORDER — HYOSCYAMINE SULFATE 0.125 MG SL SUBL: 0.1250 mg | SUBLINGUAL_TABLET | Freq: Two times a day (BID) | SUBLINGUAL | Status: DC | PRN

## 2014-11-29 MED ORDER — PANTOPRAZOLE SODIUM 40 MG PO TBEC: DELAYED_RELEASE_TABLET | ORAL | Status: DC

## 2014-11-29 MED ORDER — DILTIAZEM GEL 2 %: 1.0000 "application " | Freq: Two times a day (BID) | CUTANEOUS | Status: DC

## 2014-11-29 NOTE — Patient Instructions (Signed)
You have been scheduled for an endoscopy. Please follow written instructions given to you at your visit today. If you use inhalers (even only as needed), please bring them with you on the day of your procedure.  Stop taking omeprazole.  We have sent the following medications to your pharmacy for you to pick up at your convenience:  Pantoprazole 40mg , 1 tablet 30 minutes prior to breakfast.  Diltiazem ointment apply rectally twice daily for 6 weeks  Levsin 0.125mg , 1 tablet twice daily, as needed cramps and spasm

## 2014-11-29 NOTE — Progress Notes (Signed)
Patient ID: Marilyn Blake, female   DOB: 07-Dec-1940, 74 y.o.   MRN: [id number]    HPI:  Marilyn Blake is a 74 y.o.   female referred by Rowe Clack, MD for evaluation of rectal pain and epigastric pain.  Marilyn Blake was seen by Dr. Deatra Ina long ago. She had a colonoscopy in January 2007 which was a normal exam. She was last seen in 2011 for generalized abdominal pain and was prescribed hyoscyamine. She was also evaluated at that visit for GERD and was advised to use omeprazole. Unfortunately, she did not feel that she was taken seriously and so shortly after that visit she was evaluated by Dr. Oletta Lamas of [facility]. She had a colonoscopy on 09/16/2010 at which time there wears no angiodysplasia, diverticuli, mass, polyps, or tumor in the entire colon. She was noted to have internal hemorrhoids. She was advised to have surveillance in 5 years. She says she had difficulty scheduling follow-ups at that office, and so she called to schedule reevaluation with our office since her primary care provider is in the same building. She requested that she be assigned to a different attending. She has a past medical history of anxiety, bacteremia with Mycobacterium avium complex, bronchiectasis, colon polyps, GERD, gout, hyperlipidemia, hypothyroidism, osteopenia, and irritable bowel syndrome.  She presents today with a 3-4 month history of rectal pain. The pain is intermittent and typically occurs at night. She will get a stabbing, throbbing pain in the rectum at night. On several occasions she has tried a glycerin suppository with no relief. She states it feels like she gets cramps in her rectum. She tends to be constipated and uses Mira lax as needed with varying degrees of relief. Usually eating fruit will help. Over the past few weeks her stools have been soft formed. She has had no bright red blood per rectum or melena. She does have a sensation of incomplete evacuation and notes that she sometimes has pain as the stool  is coming out. She also complains of a burning epigastric pain of several weeks duration. The pain is worse on an empty stomach and temporarily alleviated with ingestion of food. Despite daily use of Prilosec she has been getting breakthrough heartburn for several weeks with occasional nocturnal regurgitation. She has had intermittent dysphagia although this is infrequent. She had a swallowing study in 2014 that was nonrevealing. She denies early satiety. Her appetite has been okay and her weight is been stable.   Past Medical History  Diagnosis Date  . ALLERGIC RHINITIS   . ANXIETY   . BACTEREMIA, MYCOBACTERIUM AVIUM COMPLEX   . BRONCHIECTASIS   . COLONIC POLYPS, HX OF   . GERD   . GOUT   . HYPERLIPIDEMIA   . HYPOTHYROIDISM   . Irritable bowel syndrome   . OSTEOPENIA     Past Surgical History  Procedure Laterality Date  . Thyroidectomy      Hurthele cell tumor  . Abdominal hysterectomy    . Bladder suspension    . Carpal tunnel release Left   . Mass excision Right 08/10/2013    Procedure: DEBRIDE DISTAL INTERPHALANGEAL JOINT/EXCISION MUCOID CYST RIGHT LONG FINGER and right small finger;  Surgeon: Cammie Sickle., MD;  Location: [facility];  Service: Orthopedics;  Laterality: Right;   Family History  Problem Relation Age of Onset  . Cervical cancer Mother 46     died age 87  . Heart attack Father 86   History  Substance Use Topics  .  Smoking status: Never Smoker   . Smokeless tobacco: Never Used     Comment: Married, Housewife  . Alcohol Use: No   Current Outpatient Prescriptions  Medication Sig Dispense Refill  . benzonatate (TESSALON) 100 MG capsule TAKE 1 TO 2 CAPSULES BY MOUTH 4 TIMES A DAY AS NEEDED FOR COUGH 30 capsule 0  . calcium carbonate (OS-CAL) 600 MG TABS Take 600 mg by mouth daily.     . cetirizine (ZYRTEC) 10 MG tablet Take 10 mg by mouth daily.      . diazepam (VALIUM) 5 MG tablet Take 1 tablet (5 mg total) by mouth every 12 (twelve)  hours as needed for anxiety. (Patient taking differently: Take 2.5 mg by mouth as needed for anxiety. ) 30 tablet 5  . docusate sodium (COLACE) 100 MG capsule Take 100 mg by mouth daily.    Marland Kitchen levothyroxine (SYNTHROID, LEVOTHROID) 50 MCG tablet TAKE ONE TABLET BY MOUTH EVERY OTHER DAY. 90 tablet 1  . levothyroxine (SYNTHROID, LEVOTHROID) 75 MCG tablet TAKE ONE TABLET BY MOUTH EVERY OTHER DAY- ALTERNATE WITH 50MCG. 90 tablet 1  . OMEGA 3 1000 MG CAPS Take by mouth daily.      . polyethylene glycol powder (GLYCOLAX/MIRALAX) powder Take by mouth. 1 tsp every other day    . Psyllium (METAMUCIL PO) Take by mouth every other day.    . senna (SENOKOT) 8.6 MG tablet Take 1 tablet by mouth as needed for constipation.    Marland Kitchen diltiazem 2 % GEL Apply 1 application topically 2 (two) times daily. 1 g 1  . hyoscyamine (LEVSIN/SL) 0.125 MG SL tablet Place 1 tablet (0.125 mg total) under the tongue 2 (two) times daily as needed for cramping. 30 tablet 0  . pantoprazole (PROTONIX) 40 MG tablet Take 30 minutes prior to breakfast 30 tablet 6   No current facility-administered medications for this visit.   Allergies  Allergen Reactions  . Avelox [Moxifloxacin Hcl In Nacl]   . Ethambutol Hcl     REACTION: stomach pain  . Moxifloxacin     REACTION: mild rash - causal??     Review of Systems: Gen: Denies any fever, chills, sweats, anorexia, fatigue, weakness, malaise, weight loss, and sleep disorder CV: Denies chest pain, angina, palpitations, syncope, orthopnea, PND, peripheral edema, and claudication. Resp: Denies dyspnea at rest, dyspnea with exercise, cough, sputum, wheezing, coughing up blood, and pleurisy. GI: Denies vomiting blood, jaundice, and fecal incontinence.  Has occasional dysphagia to solids. GU : Denies urinary burning, blood in urine, urinary frequency, urinary hesitancy, nocturnal urination, and urinary incontinence. MS: Denies joint pain, limitation of movement, and swelling, stiffness, low  back pain, extremity pain. Denies muscle weakness, cramps, atrophy.  Derm: Denies rash, itching, dry skin, hives, moles, warts, or unhealing ulcers.  Psych: Denies depression, anxiety, memory loss, suicidal ideation, hallucinations, paranoia, and confusion. Heme: Denies bruising, bleeding, and enlarged lymph nodes. Neuro:  Denies any headaches, dizziness, paresthesias. Endo:  Denies any problems with DM, thyroid, adrenal function  Studies:CLINICAL DATA: Dysphasia.  EXAM: ESOPHOGRAM / BARIUM SWALLOW / BARIUM TABLET STUDY  TECHNIQUE: Combined double contrast and single contrast examination performed using effervescent crystals, thick barium liquid, and thin barium liquid. The patient was observed with fluoroscopy swallowing a 50mm barium sulphate tablet.  COMPARISON: None.  FLUOROSCOPY TIME: 1 min and 31 seconds.  FINDINGS: The hypopharynx demonstrated no filling defects and was normal in contour. The esophagus was smooth in contour. Tertiary waves noted. There was no narrowing or stricture. There was  no delay in the passage of a barium tablet into the stomach. There was no hiatal hernia. There was no evidence of gastroesophageal reflux with simple provocative maneuvers.  IMPRESSION: No evidence of esophageal stricture. Presbyesophagus.   Electronically Signed  By: Donavan Burnet M.D.  On: 03/24/2013 11:07     Prior Endoscopies:  See history of present illness  Physical Exam: BP 120/70 mmHg  Pulse 80  Ht 5\' 1"  (1.549 m)  Wt 114 lb 6 oz (51.88 kg)  BMI 21.62 kg/m2  LMP 08/10/2013 Constitutional: Pleasant,well-developed female in no acute distress. HEENT: Normocephalic and atraumatic. Conjunctivae are normal. No scleral icterus. Neck supple. No cervical adenopathy Cardiovascular: Normal rate, regular rhythm.  Pulmonary/chest: Effort normal and breath sounds normal. No wheezing, rales or rhonchi. Abdominal: Soft, nondistended, nontender. Bowel  sounds active throughout. There are no masses palpable. No hepatomegaly. Rectal: No external hemorrhoids or skin tags noted. Brown stool, heme negative. Anoscopy with a small fissure and internal hemorrhoids. Extremities: no edema Lymphadenopathy: No cervical adenopathy noted. Neurological: Alert and oriented to person place and time. Skin: Skin is warm and dry. No rashes noted. Psychiatric: Normal mood and affect. Behavior is normal.  ASSESSMENT AND PLAN: #1. GERD and epigastric pain. An antireflux regimen has been reviewed. She will discontinue omeprazole and be given a trial of pantoprazole 40 mg by mouth every morning 30 minutes prior to breakfast. She will be scheduled for an EGD to evaluate for esophagitis, gastritis, ulcer, etc.The risks, benefits, and alternatives to endoscopy with possible biopsy and possible dilation were discussed with the patient and they consent to proceed.  The procedure will be scheduled with Dr. Carlean Purl per patient request.  #2. Rectal pain. This is likely multifactorial and in part due to her anal fissure and internal hemorrhoids. She will be given a trial of diltiazem ointment 2% to apply rectally twice a day for 6 weeks. She may have proctalgia fugax and will be given a trial of Levsin 0.125 mg 1 by mouth twice a day when necessary cramping.  Follow-up recommendations will be made pending the findings of the above testing.    Leolia Vinzant, Deloris Ping 11/29/2014, 4:23 PM  CC: Rowe Clack, MD

## 2014-11-30 ENCOUNTER — Other Ambulatory Visit: Payer: Self-pay

## 2014-11-30 MED ORDER — PANTOPRAZOLE SODIUM 40 MG PO TBEC: DELAYED_RELEASE_TABLET | ORAL | Status: DC

## 2014-12-07 ENCOUNTER — Ambulatory Visit (AMBULATORY_SURGERY_CENTER): Payer: Medicare Other | Admitting: Internal Medicine

## 2014-12-07 ENCOUNTER — Encounter: Payer: Self-pay | Admitting: Internal Medicine

## 2014-12-07 VITALS — BP 100/56 | HR 64 | Temp 96.9°F | Resp 16 | Ht 61.0 in | Wt 114.0 lb

## 2014-12-07 DIAGNOSIS — K219 Gastro-esophageal reflux disease without esophagitis: Secondary | ICD-10-CM | POA: Diagnosis present

## 2014-12-07 DIAGNOSIS — E039 Hypothyroidism, unspecified: Secondary | ICD-10-CM | POA: Diagnosis not present

## 2014-12-07 DIAGNOSIS — R1013 Epigastric pain: Secondary | ICD-10-CM | POA: Diagnosis not present

## 2014-12-07 DIAGNOSIS — K317 Polyp of stomach and duodenum: Secondary | ICD-10-CM

## 2014-12-07 DIAGNOSIS — J42 Unspecified chronic bronchitis: Secondary | ICD-10-CM | POA: Diagnosis not present

## 2014-12-07 MED ORDER — SODIUM CHLORIDE 0.9 % IV SOLN: 500.0000 mL | INTRAVENOUS | Status: DC

## 2014-12-07 NOTE — Patient Instructions (Addendum)
I found some stomach polyps that I biopsied. Do not think they are or will be a problem. Will call about that.  I think you will need the study described below.  Hang in there.  I appreciate the opportunity to care for you. Gatha Mayer, MD, [facility]   What is an Impedance study?     This new technology allows [place] to further evaluate the esophagus (feeding tube in the chest) when patients have symptoms that may or may not be related to acid reflux. This will help your doctor determine the cause of your symptoms.  There are other movements of air/gas and liquid (or a mixture) within the esophagus that may give you symptoms but are not caused by acid reflux. Impedance testing can tell us whether it is that (or acid reflux) that is causing your symptoms.  How is the Impedance study performed?  Manometry is done first - this test involves placement of a soft catheter/tube through the nose and down into the esophagus. The nurse then asks you to take 10 swallows of water.  After that you leave the Endoscopy suite with the catheter/tube taped in place and you return the next day for removal of the tube and to bring back the recording device.  What preparation is required?   You may have a very light meal and/or just liquids up until 4 hours before your appointment but then you should not eat or drink anything for 4 hours before your appointment. You may take your usual morning medications. You should remain on all your usual medications before and during the test.  What should you expect during and after the procedure?   A thin flexible tube is lubricated and then gently passed through a nostril down to the correct level in the esophagus. You may experience gagging or coughing briefly during the insertion of the tube but it will soon pass. You will leave the Endoscopy suite with a tube coming out of your nose (and taped across your cheek).  How does it work?   You will carry a recording  device with you and you will receive instructions on how to use that. The messages from the Impedance catheter/tube are sent to the recording device and after you return to the Endoscopy suite we remove the tube and download the information from the recorder into the computer for the doctor to review.  What will you need to do?   We ask that you carry on with a normal day including eating and drinking normally as well as participating in your usual activities. We do not want you to avoid things that usually make you have your typical symptoms. You should stay on all your medications.  Your doctor will be sent the results of your study.  YOU HAD AN ENDOSCOPIC PROCEDURE TODAY AT [facility]:   Refer to the procedure report that was given to you for any specific questions about what was found during the examination.  If the procedure report does not answer your questions, please call your gastroenterologist to clarify.  If you requested that your care partner not be given the details of your procedure findings, then the procedure report has been included in a sealed envelope for you to review at your convenience later.  YOU SHOULD EXPECT: Some feelings of bloating in the abdomen. Passage of more gas than usual.  Walking can help get rid of the air that was put into your GI tract during the  procedure and reduce the bloating. If you had a lower endoscopy (such as a colonoscopy or flexible sigmoidoscopy) you may notice spotting of blood in your stool or on the toilet paper. If you underwent a bowel prep for your procedure, you may not have a normal bowel movement for a few days.  Please Note:  You might notice some irritation and congestion in your nose or some drainage.  This is from the oxygen used during your procedure.  There is no need for concern and it should clear up in a day or so.  SYMPTOMS TO REPORT IMMEDIATELY:   Following lower endoscopy (colonoscopy or flexible  sigmoidoscopy):  Excessive amounts of blood in the stool  Significant tenderness or worsening of abdominal pains  Swelling of the abdomen that is new, acute  Fever of 100F or higher   Following upper endoscopy (EGD)  Vomiting of blood or coffee ground material  New chest pain or pain under the shoulder blades  Painful or persistently difficult swallowing  New shortness of breath  Fever of 100F or higher  Black, tarry-looking stools  For urgent or emergent issues, a gastroenterologist can be reached at any hour by calling [phone].   DIET: Your first meal following the procedure should be a small meal and then it is ok to progress to your normal diet. Heavy or fried foods are harder to digest and may make you feel nauseous or bloated.  Likewise, meals heavy in dairy and vegetables can increase bloating.  Drink plenty of fluids but you should avoid alcoholic beverages for 24 hours.  ACTIVITY:  You should plan to take it easy for the rest of today and you should NOT DRIVE or use heavy machinery until tomorrow (because of the sedation medicines used during the test).    FOLLOW UP: Our staff will call the number listed on your records the next business day following your procedure to check on you and address any questions or concerns that you may have regarding the information given to you following your procedure. If we do not reach you, we will leave a message.  However, if you are feeling well and you are not experiencing any problems, there is no need to return our call.  We will assume that you have returned to your regular daily activities without incident.  If any biopsies were taken you will be contacted by phone or by letter within the next 1-3 weeks.  Please call us at [phone] if you have not heard about the biopsies in 3 weeks.    SIGNATURES/CONFIDENTIALITY: You and/or your care partner have signed paperwork which will be entered into your electronic medical record.   These signatures attest to the fact that that the information above on your After Visit Summary has been reviewed and is understood.  Full responsibility of the confidentiality of this discharge information lies with you and/or your care-partner.

## 2014-12-07 NOTE — Progress Notes (Signed)
Called to room to assist during endoscopic procedure.  Patient ID and intended procedure confirmed with present staff. Received instructions for my participation in the procedure from the performing physician.  

## 2014-12-07 NOTE — Progress Notes (Signed)
To recovery, report to Scott, RN, VSS 

## 2014-12-07 NOTE — Op Note (Signed)
[address] [place], [id number]   ENDOSCOPY PROCEDURE REPORT  PATIENT: Marilyn Blake, Marilyn Blake  MR#: [id number] BIRTHDATE: 1940-10-08 , 44  yrs. old GENDER: female ENDOSCOPIST: Gatha Mayer, MD, [facility] PROCEDURE DATE:  12/07/2014 PROCEDURE:  EGD w/ biopsy ASA CLASS:     Class II INDICATIONS:  heartburn and epigastric pain. MEDICATIONS: Propofol 120 mg IV and Monitored anesthesia care TOPICAL ANESTHETIC: none  DESCRIPTION OF PROCEDURE: After the risks benefits and alternatives of the procedure were thoroughly explained, informed consent was obtained.  The LB [id number] endoscope was introduced through the mouth and advanced to the second portion of the duodenum , Without limitations.  The instrument was slowly withdrawn as the mucosa was fully examined.    1) Numerous benign-appearing gastric body polyps 73mm or less in size - representative biopsies taken. 2) Tortuous esophagus 3) Otherwise normal EGD, z-line at 35 cm.  Retroflexed views revealed as previously described.     The scope was then withdrawn from the patient and the procedure completed.  COMPLICATIONS: There were no immediate complications.  ENDOSCOPIC IMPRESSION: 1) Numerous benign-appearing gastric body polyps 61mm or less in size - representative biopsies taken. 2) Tortuous esophagus 3) Otherwise normal EGD, z-line at 35 cm  RECOMMENDATIONS: 1.  Await pathology results - will call with plans - she has not responded to PPI and may have diarrhea from pantoprazole - ? 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  She should call and schedule follow-up with me for October   eSigned:  Gatha Mayer, MD, [facility] 12/07/2014 11:04 AM    CC:The Patient and Gwendolyn Grant, MD

## 2014-12-10 ENCOUNTER — Telehealth: Payer: Self-pay | Admitting: *Deleted

## 2014-12-10 NOTE — Telephone Encounter (Signed)
  Follow up Call-  Call back number 12/07/2014  Post procedure Call Back phone  # [phone]  Permission to leave phone message Yes     Patient questions:  Do you have a fever, pain , or abdominal swelling? No. Pain Score  0 *  Have you tolerated food without any problems? Yes.    Have you been able to return to your normal activities? Yes.    Do you have any questions about your discharge instructions: Diet   No. Medications  No. Follow up visit  No.  Do you have questions or concerns about your Care? No.  Actions: * If pain score is 4 or above: No action needed, pain <4.

## 2014-12-10 NOTE — Progress Notes (Signed)
Agree w/ Ms. Hvozdovic's note and mangement.  

## 2014-12-11 ENCOUNTER — Encounter: Payer: Self-pay | Admitting: Internal Medicine

## 2014-12-11 NOTE — Progress Notes (Signed)
Quick Note:  Let her know polyps are benign and not precancerous so no follow-up needed  She needs manometry and 24 hr pH/impedance off of PPI to evaluate heartburn  [facility] - no recall or letter ______

## 2014-12-26 ENCOUNTER — Telehealth: Payer: Self-pay | Admitting: Gastroenterology

## 2014-12-26 NOTE — Telephone Encounter (Signed)
Instructions were mailed on Monday, she does not have a fax.  I have reviewed the instructions for mano with her over the phone .  All questions answered

## 2014-12-28 ENCOUNTER — Ambulatory Visit (HOSPITAL_COMMUNITY)
Admission: RE | Admit: 2014-12-28 | Discharge: 2014-12-28 | Disposition: A | Discharge status: Home / Self Care | Payer: Medicare Other | Source: Ambulatory Visit | Attending: Internal Medicine | Admitting: Internal Medicine

## 2014-12-28 ENCOUNTER — Encounter (HOSPITAL_COMMUNITY)
Admission: RE | Disposition: A | Discharge status: Home / Self Care | Payer: Self-pay | Source: Ambulatory Visit | Attending: Internal Medicine

## 2014-12-28 SURGERY — MANOMETRY, ESOPHAGUS
Anesthesia: Topical

## 2015-01-31 ENCOUNTER — Encounter: Payer: Self-pay | Admitting: Internal Medicine

## 2015-01-31 ENCOUNTER — Ambulatory Visit (INDEPENDENT_AMBULATORY_CARE_PROVIDER_SITE_OTHER): Payer: Medicare Other | Admitting: Internal Medicine

## 2015-01-31 ENCOUNTER — Ambulatory Visit (INDEPENDENT_AMBULATORY_CARE_PROVIDER_SITE_OTHER)
Admission: RE | Admit: 2015-01-31 | Discharge: 2015-01-31 | Disposition: A | Discharge status: Home / Self Care | Payer: Medicare Other | Source: Ambulatory Visit | Attending: Internal Medicine | Admitting: Internal Medicine

## 2015-01-31 VITALS — BP 118/62 | HR 62 | Ht 63.0 in | Wt 116.2 lb

## 2015-01-31 DIAGNOSIS — Z23 Encounter for immunization: Secondary | ICD-10-CM

## 2015-01-31 DIAGNOSIS — J42 Unspecified chronic bronchitis: Secondary | ICD-10-CM

## 2015-01-31 DIAGNOSIS — R05 Cough: Secondary | ICD-10-CM | POA: Diagnosis not present

## 2015-01-31 DIAGNOSIS — J302 Other seasonal allergic rhinitis: Secondary | ICD-10-CM

## 2015-01-31 DIAGNOSIS — J479 Bronchiectasis, uncomplicated: Secondary | ICD-10-CM

## 2015-01-31 NOTE — Patient Instructions (Addendum)
Flu vax  Order CXR  Dx chronic bronchitis/ bronchiectasis without exacerbation  Please call as needed

## 2015-01-31 NOTE — Progress Notes (Signed)
Patient ID: Marilyn Blake, female    DOB: 02-Jan-1941, 74 y.o.   MRN: [id number]  HPI 02/03/2011-74 year old female never smoker followed for bronchiectasis, history of MAIC bronchitis, allergic rhinitis. Last here 08/04/2010 Chest x-ray in September of 2011 had shown bilateral scarring, bronchiectasis in the mid zones bilaterally CXR 08/04/2010-stable bronchiectasis, scarring, micro-nodularities in the small area of cavitary change in the right upper lobe consistent with chronic Mycobacterium infection. Acute visit-2 days of chills or lays temperature 100.8 yesterday, sore throat and nasal congestion. Cough is productive of some yellow sputum. No GI complaint.  06/16/11- 74 year old female never smoker followed for bronchiectasis, history of MAIC bronchitis, allergic rhinitis, insomnia. Her breathing has been comfortable. Not much daily cough. More easily winded with activities of daily living over the last 2 weeks but nothing very specific. A few vague twinge pains high in the left upper chest without palpitation or swelling. She has had to have a lot of dental work this winter and has often been uncomfortable from that and her age-related arthritis complaints. As a result she is not sleeping as well. She has taken occasional diazepam, usually one half tablet. We discussed alternatives including a short half-life medication that would be out of her system in the morning.  10/13/11- 74 year old female never smoker followed for bronchiectasis, history of MAIC bronchitis, allergic rhinitis, insomnia. Gets more winded than usual with activity-states she is very active overall. Last weekend had episode of coughing up blood(rich red blood) and then stopped-no more episodes since(Had SOB with it as well)  CXR 06/23/11- 74 year old female never smoker followed for bronchiectasis, history of MAIC bronchitis, allergic rhinitis, insomnia. Gradually more short of breath without increased cough. Notices it   especially after a day of yard work Did cough up some red blood mixed in clear phlegm,  one time, a week or more ago. Some pain left upper anterior chest sounds pleuritic. Few night sweats. CXR- 07/13/11- reviewed images with her IMPRESSION:  Stable chronic lung disease. No acute cardiopulmonary process.  Original Report Authenticated By: Vivia Ewing, M.D.   02/15/12-  74 year old female never smoker followed for bronchiectasis, history of MAIC bronchitis, allergic rhinitis, insomnia.  Pt states breathing has improved since last visit. She has had no more hemoptysis since last visit. Cough is prod with minimal light green sputum.  Good summer. No blood since last year. Seasonal sneezing and blowing. Husband being treated for colon cancer.  01/18/13-  74 year old female never smoker followed for bronchiectasis, history of MAIC bronchitis, allergic rhinitis, insomnia. FOLLOWS FOR: Chest pain, wheezing, SOB and cough w/mucus yellow in color. Some f/c/s beginning last p.m worsened More cough last week. BR 11/15/2012 for chest pain negative. Last night cough, soreness left upper anterior chest with low-grade fever and sweat. Takes Prilosec and not aware of reflux. Feels run down. Husband dealing with colorectal cancer which is stress for them. CXR 11/15/12 IMPRESSION:  Chronic opacities with bronchiectasis, most prominent in the right  middle lobe and lingula, suspicious for atypical infection such as  MAI.  No definite superimposed opacities suspicious for pneumonia.  Original Report Authenticated By: Julian Hy, M.D.  10/23/13-74 year old female never smoker followed for bronchiectasis, recurrent MAIC bronchitis, allergic rhinitis, insomnia. FOLLOWS FOR: SOB and left sided CP since lat OV. Has been under more stress as well 3 weeks increased DOE hills, dry cough, night sweats. L parasternal soreness with reaching or deep breath.  Sputum cx 9 4/14 + MAIC, neg routine flora, neg  fungi  03/15/14-  74 year old female never smoker followed for bronchiectasis, recurrent MAIC bronchitis, allergic rhinitis, insomnia. FOLLOW FOR: Patient stilll has some sob,cough and runny nose. She has had headache  CXR 10/23/13 IMPRESSION:  There are stable changes of COPD. Atelectasis versus scarring in the  lingula and right middle lobe is present and stable.  Electronically Signed  By: David Martinique  On: 10/23/2013 13:29   01/31/15-  74 year old female never smoker followed for bronchiectasis, recurrent MAIC bronchitis, allergic rhinitis, insomnia, complicated by hypothyroid, IBS Follows For: pt states shes been having headaches with runny nose and dry cough. pt has been using flonase. pt states shes been wheezing at times no c/o SOB.  Originally + MAIC by [employer] 2010 and treated 14 months with cipro, zith. Sputum + again 2014.  Intermittent pains behind right ear and frontal headache. Zyrtec and Flonase keep rhinitis controlled. Saw ENT 3 months ago for the headache Occasional cough, clear mucus with no blood. Little shortness of breath. She doubts need for an inhaler. Denies night sweats and fever.  Review of Systems- see HPI Constitutional:   No-   weight loss, +night sweats, , chills, +fatigue, lassitude. HEENT:   +  headaches, difficulty swallowing, tooth/dental problems, sore throat,       No-  sneezing, itching, ear ache, +nasal congestion, post nasal drip,  CV:  +atypicalchest pain, orthopnea, PND, swelling in lower extremities, anasarca,dizziness, palpitations Resp: + shortness of breath with exertion or at rest.             + productive cough,   non-productive cough,  no- coughing up of blood.              No- change in color of mucus.  No- wheezing.   Skin: No-   rash or lesions. GI:  No-   heartburn, indigestion, abdominal pain, nausea, vomiting,  GU:  MS:  No-   joint pain or swelling. . Neuro- grossly normal to observation,   Psych:  No- change in mood or affect. No  depression or anxiety.  No memory loss.  Objective:   Physical Exam General- Alert, Oriented, Affect-appropriate, Distress- none acute, petite lady Skin- rash-none, lesions- none, excoriation- none Lymphadenopathy- none Head- atraumatic            Eyes- Gross vision intact, PERRLA, conjunctivae clear secretions            Ears- Hearing, canals- normal            Nose- Clear, No-Septal dev, mucus, polyps, erosion, perforation             Throat- Mallampati II , mucosa clear- not red , drainage- none, tonsils- atrophic, Neck- flexible , trachea midline, no stridor , thyroid nl, carotid no bruit Chest - symmetrical excursion , unlabored           Heart/CV- RRR , no murmur , no gallop  , no rub, nl s1 s2                           - JVD- none , edema- none, stasis changes- none, varices- none           Lung- clear, unlabored, wheeze- none, cough- none , dullness-none, rub-none           Chest wall- Not tender pressure L parasternal joints, Abd- Br/ Gen/ Rectal- Not done, not indicated Extrem- cyanosis- none, clubbing, none, atrophy- none, strength- nl Neuro- grossly intact to observation

## 2015-02-01 ENCOUNTER — Telehealth: Payer: Self-pay | Admitting: Internal Medicine

## 2015-02-01 MED ORDER — AZITHROMYCIN 250 MG PO TABS: ORAL_TABLET | ORAL | Status: AC

## 2015-02-01 NOTE — Telephone Encounter (Signed)
cxr no pna or other acute changes rec zpak

## 2015-02-01 NOTE — Telephone Encounter (Signed)
Pt is aware of her CXR results. Rx has been sent in for Ryan. Nothing further was needed.

## 2015-02-01 NOTE — Telephone Encounter (Addendum)
Patient wants results of Xray.  Patient says that she is coughing up greenish/yellow mucus, having chills. Patient wants to know if she needs an antibiotic.   Allergies  Allergen Reactions  . Avelox [Moxifloxacin Hcl In Nacl]     Rash- unsure if truly had a reaction  . Ethambutol Hcl     REACTION: stomach pain  . Moxifloxacin     REACTION: mild rash - causal??   To DOD in CY absence

## 2015-02-03 DIAGNOSIS — J302 Other seasonal allergic rhinitis: Secondary | ICD-10-CM | POA: Insufficient documentation

## 2015-02-03 NOTE — Assessment & Plan Note (Signed)
She is not describing symptoms suggestive of active atypical infection Plan-chest x-ray, flu shot

## 2015-02-03 NOTE — Assessment & Plan Note (Signed)
Adequate control with Zyrtec and Flonase

## 2015-02-12 ENCOUNTER — Encounter: Payer: Self-pay | Admitting: Internal Medicine

## 2015-02-12 ENCOUNTER — Ambulatory Visit (INDEPENDENT_AMBULATORY_CARE_PROVIDER_SITE_OTHER): Payer: Medicare Other | Admitting: Internal Medicine

## 2015-02-12 ENCOUNTER — Other Ambulatory Visit (INDEPENDENT_AMBULATORY_CARE_PROVIDER_SITE_OTHER): Payer: Medicare Other

## 2015-02-12 VITALS — BP 100/60 | HR 74 | Temp 97.8°F | Ht 63.0 in | Wt 116.0 lb

## 2015-02-12 DIAGNOSIS — M8580 Other specified disorders of bone density and structure, unspecified site: Secondary | ICD-10-CM

## 2015-02-12 DIAGNOSIS — E890 Postprocedural hypothyroidism: Secondary | ICD-10-CM

## 2015-02-12 DIAGNOSIS — Z0000 Encounter for general adult medical examination without abnormal findings: Secondary | ICD-10-CM

## 2015-02-12 DIAGNOSIS — E785 Hyperlipidemia, unspecified: Secondary | ICD-10-CM

## 2015-02-12 LAB — LIPID PANEL
CHOL/HDL RATIO: 6
Cholesterol: 284 mg/dL — ABNORMAL HIGH (ref 0–200)
HDL: 49.2 mg/dL (ref >39.00)
LDL CALC: 210 mg/dL — AB (ref 0–99)
NONHDL: 234.58
Triglycerides: 123 mg/dL (ref 0.0–149.0)
VLDL: 24.6 mg/dL (ref 0.0–40.0)

## 2015-02-12 LAB — BASIC METABOLIC PANEL
BUN: 12 mg/dL (ref 6–23)
CALCIUM: 9.8 mg/dL (ref 8.4–10.5)
CO2: 32 meq/L (ref 19–32)
CREATININE: 0.75 mg/dL (ref 0.40–1.20)
Chloride: 101 mEq/L (ref 96–112)
GFR: 79.93 mL/min (ref >60.00)
GLUCOSE: 95 mg/dL (ref 70–99)
Potassium: 4.1 mEq/L (ref 3.5–5.1)
SODIUM: 139 meq/L (ref 135–145)

## 2015-02-12 LAB — TSH: TSH: 3.22 u[IU]/mL (ref 0.35–4.50)

## 2015-02-12 NOTE — Progress Notes (Signed)
Subjective:    Patient ID: Marilyn Blake, female    DOB: 1940/09/24, 74 y.o.   MRN: [id number]  HPI   Here for medicare wellness  Diet: heart healthy  Physical activity: sedentary Depression/mood screen: negative Hearing: intact to whispered voice Visual acuity: grossly normal, performs annual eye exam  ADLs: capable Fall risk: none Home safety: good Cognitive evaluation: intact to orientation, naming, recall and repetition EOL planning: adv directives, full code/ I agree  I have personally reviewed and have noted 1. The patient's medical and social history 2. Their use of alcohol, tobacco or illicit drugs 3. Their current medications and supplements 4. The patient's functional ability including ADL's, fall risks, home safety risks and hearing or visual impairment. 5. Diet and physical activities 6. Evidence for depression or mood disorders  Also reviewed chronic medical conditions, interval events and current concerns  Past Medical History  Diagnosis Date  . ALLERGIC RHINITIS   . ANXIETY   . BACTEREMIA, MYCOBACTERIUM AVIUM COMPLEX   . BRONCHIECTASIS   . COLONIC POLYPS, HX OF   . GERD   . GOUT   . HYPERLIPIDEMIA   . HYPOTHYROIDISM   . Irritable bowel syndrome   . OSTEOPENIA   . Fundic gland polyps of stomach, benign    Family History  Problem Relation Age of Onset  . Cervical cancer Mother 22     died age 74  . Heart attack Father 82  . Colon cancer Neg Hx   . Esophageal cancer Neg Hx   . Stomach cancer Neg Hx   . Rectal cancer Neg Hx    Social History  Substance Use Topics  . Smoking status: Never Smoker   . Smokeless tobacco: Never Used     Comment: Married, Housewife  . Alcohol Use: No    Review of Systems  Constitutional: Negative for fatigue and unexpected weight change.  Respiratory: Negative for cough, shortness of breath and wheezing.   Cardiovascular: Negative for chest pain, palpitations and leg swelling.  Gastrointestinal: Negative for  nausea, abdominal pain and diarrhea.  Neurological: Positive for headaches (occ with weather change, resolved with ASA 81 and Flonase prn). Negative for dizziness, weakness and light-headedness.  Psychiatric/Behavioral: Negative for dysphoric mood. The patient is not nervous/anxious.   All other systems reviewed and are negative.   Patient Care Team: Rowe Clack, MD as PCP - General Deneise Lever, MD (Pulmonary Disease) Ladene Artist, MD (Gastroenterology) Gatha Mayer, MD (Gastroenterology)     Objective:    Physical Exam  Constitutional: She is oriented to person, place, and time. She appears well-developed and well-nourished. No distress.  Cardiovascular: Normal rate, regular rhythm and normal heart sounds.   No murmur heard. Pulmonary/Chest: Effort normal and breath sounds normal. No respiratory distress.  Musculoskeletal: She exhibits no edema.  Neurological: She is alert and oriented to person, place, and time. She displays normal reflexes. No cranial nerve deficit. She exhibits normal muscle tone. Coordination normal.  Psychiatric: She has a normal mood and affect. Her behavior is normal. Judgment and thought content normal.    BP 100/60 mmHg  Pulse 74  Temp(Src) 97.8 F (36.6 C) (Oral)  Ht 5\' 3"  (1.6 m)  Wt 116 lb (52.617 kg)  BMI 20.55 kg/m2  SpO2 95%  LMP 08/10/2013 Wt Readings from Last 3 Encounters:  02/12/15 116 lb (52.617 kg)  01/31/15 116 lb 3.2 oz (52.708 kg)  12/07/14 114 lb (51.71 kg)     Lab Results  Component  Value Date   WBC 6.6 03/27/2014   HGB 13.6 03/27/2014   HCT 41.4 03/27/2014   PLT 191.0 03/27/2014   GLUCOSE 98 03/27/2014   CHOL 203* 03/27/2014   TRIG 107.0 03/27/2014   HDL 46.00 03/27/2014   LDLDIRECT 121.7 01/18/2013   LDLCALC 136* 03/27/2014   ALT 19 03/27/2014   AST 27 03/27/2014   NA 140 03/27/2014   K 3.9 03/27/2014   CL 102 03/27/2014   CREATININE 0.7 03/27/2014   BUN 14 03/27/2014   CO2 34* 03/27/2014   TSH  3.41 03/27/2014   INR 1.04 11/15/2012    Dg Chest 2 View  01/31/2015   CLINICAL DATA:  Cough for 5 days. History of bronchiectasis and mycobacterium avium.  Chronic bronchitis, unspecified chronic bronchitis type J42 ([id number])[id number] without complication V78.4 ([id number])  EXAM: CHEST  2 VIEW  COMPARISON:  10/23/2013  FINDINGS: Cardiac silhouette is normal in size and configuration. No mediastinal or hilar masses or evidence of adenopathy.  There are coarse reticular areas in the lungs, which predominate in the left upper lobe lingula and right middle lobe. This is consistent with scarring and is stable. There is no lung consolidation to suggest pneumonia. No pulmonary edema. No pleural effusion pneumothorax.  Skeletal structures are demineralized but grossly intact.  IMPRESSION: 1. No acute cardiopulmonary disease. 2. Stable areas of lung scarring. No change from the prior chest radiograph.   Electronically Signed   By: Lajean Manes M.D.   On: 01/31/2015 10:49       Assessment & Plan:   AWV/z00.00 - Today patient counseled on age appropriate routine health concerns for screening and prevention, each reviewed and up to date or declined. Immunizations reviewed and up to date or declined. Labs reviewed. Risk factors for depression reviewed and negative. Hearing function and visual acuity are intact. ADLs screened and addressed as needed. Functional ability and level of safety reviewed and appropriate. Education, counseling and referrals performed based on assessed risks today. Patient provided with a copy of personalized plan for preventive services.  Problem List Items Addressed This Visit    Hyperlipidemia    Prev on simva, self DC'd same summer 2016 - last lipids reviewed, check annually The current medical regimen is effective;  continue present plan and medications.       Relevant Orders   Lipid panel   Hypothyroidism    Alt 50 qod with 75 qod S/p thyroidectomy in 1998 Check tsh  now and adjust as needed Lab Results  Component Value Date   TSH 3.41 03/27/2014        Relevant Orders   TSH   Osteopenia    On calcium with weight bearing activities, no prescription therapy for same Check DEXA q 19mo, consider treatment if needed Reviewed potential influence of PPI on acceleration of same Continue Ca and Vit D and WB exercises as ongoing No fracture history      Relevant Orders   Basic metabolic panel    Other Visit Diagnoses    Routine general medical examination at a health care facility    -  Primary        Gwendolyn Grant, MD

## 2015-02-12 NOTE — Assessment & Plan Note (Signed)
On calcium with weight bearing activities, no prescription therapy for same Check DEXA q 50mo, consider treatment if needed Reviewed potential influence of PPI on acceleration of same Continue Ca and Vit D and WB exercises as ongoing No fracture history

## 2015-02-12 NOTE — Progress Notes (Signed)
Pre visit review using our clinic review tool, if applicable. No additional management support is needed unless otherwise documented below in the visit note. 

## 2015-02-12 NOTE — Patient Instructions (Signed)
It was good to see you today.  We have reviewed your prior records including labs and tests today  Health Maintenance reviewed - all recommended immunizations and age-appropriate screenings are up-to-date.  Test(s) ordered today. Your results will be released to [facility] (or called to you) after review, usually within 72hours after test completion. If any changes need to be made, you will be notified at that same time.  Medications reviewed and updated, no changes recommended at this time.  Please schedule followup in 12 months for annual exam and labs, call sooner if problems.

## 2015-02-12 NOTE — Assessment & Plan Note (Signed)
Prev on simva, self DC'd same summer 2016 - last lipids reviewed, check annually The current medical regimen is effective;  continue present plan and medications.

## 2015-02-12 NOTE — Assessment & Plan Note (Signed)
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

## 2015-02-13 MED ORDER — SIMVASTATIN 20 MG PO TABS: 20.0000 mg | ORAL_TABLET | Freq: Every day | ORAL | Status: DC

## 2015-02-13 NOTE — Addendum Note (Signed)
Addended by: Gwendolyn Grant A on: 02/13/2015 08:20 AM   Modules accepted: Orders, SmartSet

## 2015-02-27 ENCOUNTER — Ambulatory Visit (INDEPENDENT_AMBULATORY_CARE_PROVIDER_SITE_OTHER): Payer: Medicare Other | Admitting: Neurology

## 2015-02-27 ENCOUNTER — Encounter: Payer: Self-pay | Admitting: Neurology

## 2015-02-27 VITALS — BP 113/71 | HR 76 | Ht 61.0 in | Wt 118.0 lb

## 2015-02-27 DIAGNOSIS — G44209 Tension-type headache, unspecified, not intractable: Secondary | ICD-10-CM | POA: Diagnosis not present

## 2015-02-27 DIAGNOSIS — R519 Headache, unspecified: Secondary | ICD-10-CM | POA: Insufficient documentation

## 2015-02-27 DIAGNOSIS — G441 Vascular headache, not elsewhere classified: Secondary | ICD-10-CM | POA: Diagnosis not present

## 2015-02-27 DIAGNOSIS — R51 Headache: Secondary | ICD-10-CM

## 2015-02-27 MED ORDER — TOPIRAMATE 25 MG PO TABS: 25.0000 mg | ORAL_TABLET | Freq: Two times a day (BID) | ORAL | Status: DC

## 2015-02-27 NOTE — Progress Notes (Signed)
[facility] [address] [place]. [place] [address] [phone]       OFFICE CONSULT NOTE  Ms. Malayia Spizzirri Date of Birth:  1940/11/01 Medical Record Number:  [id number]   Referring MD:   Melissa Montane  Reason for Referral:   headache  HPI: Ms Locascio  Is a pleasant 74 year old Caucasian lady whose had headaches for the last 6-8 months. She first noticed this following a bout of allergies and she was treated with Flonase and Zyrtec for allergy treatment but without relief. She describes the headache mostly in the back of the head but also at the vertex and occasionally in the front. She describes this as a pressure-like sensation of muscle tightness. It is 5/10 in severity. Nausea is occasional but she does not vomit and does not have any light or sound sensitivity. Taking over-the-counter medication like panel all help temporarily. The last couple of months the headaches frequency is increased and now they occur on a daily basis. She insists she takes not more than one Tylenol tablet a day and does not take any other over-the-counter pain medications. She denies any complaints focal symptoms in the form of loss of vision blurred vision speech difficulty extremity weakness gait or balance problem. She has no prior history of known migraine headaches. She denies history of significant head injury, concussion, seizure, stroke, TIA or loss of consciousness. She has not had any brain imaging studies done and has not seen a neurologist. 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 She does admit to being under a lot of stress the last couple of years since her husband has colorectal cancer. He has  Colostomy and has had several problems with that since. She likes to exercise but does not particpate in regular activities for stress relaxation. She denies blurred vision, loss of vision , scalp tenderness, jaw  claudication or myalgias. She does have chronic joint pain due to arthritis.  ROS:   14 system review of systems is positive for   Headache, anxiety, decreased energy, disinterest in activities, insomnia, joint pain, allergies, runny nose, blurred vision, constipation, fatigue and all other systems negative  PMH:  Past Medical History  Diagnosis Date  . ALLERGIC RHINITIS   . ANXIETY   . BACTEREMIA, MYCOBACTERIUM AVIUM COMPLEX   . BRONCHIECTASIS   . COLONIC POLYPS, HX OF   . GERD   . GOUT   . HYPERLIPIDEMIA   . HYPOTHYROIDISM   . Irritable bowel syndrome   . OSTEOPENIA   . Fundic gland polyps of stomach, benign   . Headache   . Arthritis     Social History:  Social History   Social History  . Marital Status: Married    Spouse Name: N/A  . Number of Children: 3  . Years of Education: N/A   Occupational History  . Retired    Social History Main Topics  . Smoking status: Never Smoker   . Smokeless tobacco: Never Used     Comment: Married, Housewife  . Alcohol Use: No  . Drug Use: No  . Sexual Activity: Not on file   Other Topics Concern  . Not on file   Social History Narrative    Medications:   Current Outpatient Prescriptions on File Prior to Visit  Medication Sig Dispense Refill  . benzonatate (TESSALON) 100 MG capsule TAKE 1 TO 2 CAPSULES BY MOUTH 4 TIMES A DAY AS  NEEDED FOR COUGH 30 capsule 0  . calcium carbonate (OS-CAL) 600 MG TABS Take 600 mg by mouth daily.     . cetirizine (ZYRTEC) 10 MG tablet Take 10 mg by mouth daily.      . CHROMIUM PO Take by mouth daily.    . diazepam (VALIUM) 5 MG tablet Take 1 tablet (5 mg total) by mouth every 12 (twelve) hours as needed for anxiety. (Patient taking differently: Take 2.5 mg by mouth as needed for anxiety. ) 30 tablet 5  . docusate sodium (COLACE) 100 MG capsule Take 100 mg by mouth daily.    Marland Kitchen levothyroxine (SYNTHROID, LEVOTHROID) 50 MCG tablet TAKE ONE TABLET BY MOUTH EVERY OTHER DAY. 90 tablet 1  .  levothyroxine (SYNTHROID, LEVOTHROID) 75 MCG tablet TAKE ONE TABLET BY MOUTH EVERY OTHER DAY- ALTERNATE WITH 50MCG. 90 tablet 1  . OMEGA 3 1000 MG CAPS Take by mouth daily.      . pantoprazole (PROTONIX) 40 MG tablet Take 30 minutes prior to breakfast 90 tablet 1  . polyethylene glycol powder (GLYCOLAX/MIRALAX) powder Take by mouth. 1 tsp every other day    . Psyllium (METAMUCIL PO) Take by mouth every other day.    . senna (SENOKOT) 8.6 MG tablet Take 1 tablet by mouth as needed for constipation.    . simvastatin (ZOCOR) 20 MG tablet Take 1 tablet (20 mg total) by mouth at bedtime. 90 tablet 3   No current facility-administered medications on file prior to visit.    Allergies:   Allergies  Allergen Reactions  . Avelox [Moxifloxacin Hcl In Nacl]     Rash- unsure if truly had a reaction  . Ethambutol Hcl     REACTION: stomach pain  . Moxifloxacin     REACTION: mild rash - causal??    Physical Exam General: well developed, well nourished, seated, in no evident distress Head: head normocephalic and atraumatic.   Neck: supple with no carotid or supraclavicular bruits Cardiovascular: regular rate and rhythm, no murmurs Musculoskeletal: no deformity. Mild tightness of posterior neck and scapular muscles. Skin:  no rash/petichiae Vascular:  Normal pulses all extremities  Neurologic Exam Mental Status: Awake and fully alert. Oriented to place and time. Recent and remote memory intact. Attention span, concentration and fund of knowledge appropriate. Mood and affect appropriate.  Cranial Nerves: Fundoscopic exam reveals sharp disc margins. Pupils equal, briskly reactive to light. Extraocular movements full without nystagmus. Visual fields full to confrontation. Hearing intact. Facial sensation intact. Face, tongue, palate moves normally and symmetrically.  Motor: Normal bulk and tone. Normal strength in all tested extremity muscles. Sensory.: intact to touch , pinprick , position and  vibratory sensation.  Coordination: Rapid alternating movements normal in all extremities. Finger-to-nose and heel-to-shin performed accurately bilaterally. Gait and Station: Arises from chair without difficulty. Stance is normal. Gait demonstrates normal stride length and balance . Able to heel, toe and tandem walk without difficulty.  Reflexes: 1+ and symmetric. Toes downgoing.       ASSESSMENT: 23 year  Caucasian lady with 6-8 month history of posterior headaches likely muscle tension headaches.    PLAN: I had a long discussion with the patient regarding her headaches, discussed my clinical impression, plan for evaluation, treatment plan and answered questions. I recommend she do regular neck stretching exercises as well as increased participation in activities for stress relaxation like regular exercise, swimming, medication and yoga. Start Topamax 25 mg twice daily for headache prevention and increased and all to 2 tablets  as needed for symptomatic relief. Check MRI scan of the brain for any underlying structural brain abnormalities. Return for follow-up in 2 months or call earlier if necessary. Antony Contras, MD Note: This document was prepared with digital dictation and possible smart phrase technology. Any transcriptional errors that result from this process are unintentional.

## 2015-02-27 NOTE — Patient Instructions (Signed)
I had a long discussion with the patient regarding her headaches, discussed my clinical impression, plan for evaluation, treatment plan and answered questions. I recommend she do regular neck stretching exercises as well as increased participation in activities for stress relaxation like regular exercise, swimming, medication and yoga. Start Topamax 25 mg twice daily for headache prevention and increased and all to 2 tablets as needed for symptomatic relief. Check MRI scan of the brain for any underlying structural brain abnormalities. Return for follow-up in 2 months or call earlier if necessary.  Tension Headache A tension headache is a feeling of pain, pressure, or aching that is often felt over the front and sides of the head. The pain can be dull, or it can feel tight (constricting). Tension headaches are not normally associated with nausea or vomiting, and they do not get worse with physical activity. Tension headaches can last from 30 minutes to several days. This is the most common type of headache. CAUSES The exact cause of this condition is not known. Tension headaches often begin after stress, anxiety, or depression. Other triggers may include:  Alcohol.  Too much caffeine, or caffeine withdrawal.  Respiratory infections, such as colds, flu, or sinus infections.  Dental problems or teeth clenching.  Fatigue.  Holding your head and neck in the same position for a long period of time, such as while using a computer.  Smoking. SYMPTOMS Symptoms of this condition include:  A feeling of pressure around the head.  Dull, aching head pain.  Pain felt over the front and sides of the head.  Tenderness in the muscles of the head, neck, and shoulders. DIAGNOSIS This condition may be diagnosed based on your symptoms and a physical exam. Tests may be done, such as a CT scan or an MRI of your head. These tests may be done if your symptoms are severe or unusual. TREATMENT This condition  may be treated with lifestyle changes and medicines to help relieve symptoms. HOME CARE INSTRUCTIONS Managing Pain  Take over-the-counter and prescription medicines only as told by your health care provider.  Lie down in a dark, quiet room when you have a headache.  If directed, apply ice to the head and neck area:  Put ice in a plastic bag.  Place a towel between your skin and the bag.  Leave the ice on for 20 minutes, 2-3 times per day.  Use a heating pad or a hot shower to apply heat to the head and neck area as told by your health care provider. Eating and Drinking  Eat meals on a regular schedule.  Limit alcohol use.  Decrease your caffeine intake, or stop using caffeine. General Instructions  Keep all follow-up visits as told by your health care provider. This is important.  Keep a headache journal to help find out what may trigger your headaches. For example, write down:  What you eat and drink.  How much sleep you get.  Any change to your diet or medicines.  Try massage or other relaxation techniques.  Limit stress.  Sit up straight, and avoid tensing your muscles.  Do not use tobacco products, including cigarettes, chewing tobacco, or e-cigarettes. If you need help quitting, ask your health care provider.  Exercise regularly as told by your health care provider.  Get 7-9 hours of sleep, or the amount recommended by your health care provider. SEEK MEDICAL CARE IF:  Your symptoms are not helped by medicine.  You have a headache that is different from  what you normally experience.  You have nausea or you vomit.  You have a fever. SEEK IMMEDIATE MEDICAL CARE IF:  Your headache becomes severe.  You have repeated vomiting.  You have a stiff neck.  You have a loss of vision.  You have problems with speech.  You have pain in your eye or ear.  You have muscular weakness or loss of muscle control.  You lose your balance or you have trouble  walking.  You feel faint or you pass out.  You have confusion.   This information is not intended to replace advice given to you by your health care provider. Make sure you discuss any questions you have with your health care provider.   Document Released: 05/04/2005 Document Revised: 01/23/2015 Document Reviewed: 08/27/2014 Elsevier Interactive Patient Education [employer].

## 2015-03-15 ENCOUNTER — Other Ambulatory Visit: Payer: Self-pay | Admitting: Internal Medicine

## 2015-03-19 ENCOUNTER — Ambulatory Visit
Admission: RE | Admit: 2015-03-19 | Discharge: 2015-03-19 | Disposition: A | Discharge status: Home / Self Care | Payer: Medicare Other | Source: Ambulatory Visit | Attending: Neurology | Admitting: Neurology

## 2015-03-19 DIAGNOSIS — G441 Vascular headache, not elsewhere classified: Secondary | ICD-10-CM

## 2015-03-19 MED ORDER — GADOBENATE DIMEGLUMINE 529 MG/ML IV SOLN
10.0000 mL | Freq: Once | INTRAVENOUS | Status: AC | PRN
Start: 2015-03-19 — End: 2015-03-19
  Administered 2015-03-19: 10 mL via INTRAVENOUS

## 2015-03-20 ENCOUNTER — Telehealth: Payer: Self-pay | Admitting: Internal Medicine

## 2015-03-20 ENCOUNTER — Telehealth: Payer: Self-pay | Admitting: Neurology

## 2015-03-20 MED ORDER — LEVOTHYROXINE SODIUM 75 MCG PO TABS: ORAL_TABLET | ORAL | Status: DC

## 2015-03-20 MED ORDER — LEVOTHYROXINE SODIUM 50 MCG PO TABS: ORAL_TABLET | ORAL | Status: DC

## 2015-03-20 MED ORDER — DIAZEPAM 5 MG PO TABS: ORAL_TABLET | ORAL | Status: DC

## 2015-03-20 NOTE — Telephone Encounter (Signed)
OK to fill this prescription with additional refills x1 Thank you!  

## 2015-03-20 NOTE — Telephone Encounter (Signed)
Sent maintenance med already pls advise on refill on Diazepam.../lmb

## 2015-03-20 NOTE — Telephone Encounter (Signed)
Rn call patient back about her MRI results. Pt had MRI scan on 03-19-15 in the pm She wanted to know her results. Rn explain that looking at her chart, the scans are still pending. Rn explain once they are read, Dr.Sethi will give her a call.

## 2015-03-20 NOTE — Telephone Encounter (Signed)
Patient is calling to get MRI results. °

## 2015-03-20 NOTE — Telephone Encounter (Signed)
Notified pt diazepam has been called to [facility] had to leave on pharmacy vm...Marilyn Blake

## 2015-03-20 NOTE — Telephone Encounter (Signed)
Patient was just in 9/28 and she is requesting refills for her levothyroxine (SYNTHROID, LEVOTHROID) 50 MCG tablet [[id number]]  And wanting to extend her diazepam (VALIUM) 5 MG tablet [[id number]  She is wanting to wait till the first of the year to establish care with another provider.  Pharmacy is [facility] [address]

## 2015-03-25 NOTE — Telephone Encounter (Signed)
I called and left message on patient's answering machine to call me back to discuss discuss MRI results.

## 2015-03-26 ENCOUNTER — Telehealth: Payer: Self-pay | Admitting: Neurology

## 2015-03-26 NOTE — Telephone Encounter (Signed)
[facility] call was transferred to [place] for patient wanting results of her MRI.

## 2015-03-26 NOTE — Telephone Encounter (Signed)
Patient returned call

## 2015-04-02 ENCOUNTER — Encounter: Payer: Medicare Other | Admitting: Internal Medicine

## 2015-05-03 ENCOUNTER — Other Ambulatory Visit: Payer: Self-pay

## 2015-05-03 DIAGNOSIS — Z1231 Encounter for screening mammogram for malignant neoplasm of breast: Secondary | ICD-10-CM

## 2015-05-16 ENCOUNTER — Encounter: Payer: Self-pay | Admitting: *Deleted

## 2015-05-21 ENCOUNTER — Ambulatory Visit (INDEPENDENT_AMBULATORY_CARE_PROVIDER_SITE_OTHER): Payer: Medicare Other | Admitting: Neurology

## 2015-05-21 ENCOUNTER — Encounter: Payer: Self-pay | Admitting: Neurology

## 2015-05-21 VITALS — BP 91/57 | HR 78 | Ht 61.0 in | Wt 117.4 lb

## 2015-05-21 DIAGNOSIS — G44209 Tension-type headache, unspecified, not intractable: Secondary | ICD-10-CM | POA: Diagnosis not present

## 2015-05-21 NOTE — Progress Notes (Signed)
[facility] [address] [place]. [place] [address] [phone]       OFFICE FOLLOW UP VISIT NOTE  Marilyn. Marilyn Blake Date of Birth:  08/09/1940 Medical Record Number:  [id number]   Referring MD:   Melissa Montane  Reason for Referral:   headache  HPI: Marilyn Blake  Is a pleasant 75 year old Caucasian lady whose had headaches for the last 6-8 months. She first noticed this following a bout of allergies and she was treated with Flonase and Zyrtec for allergy treatment but without relief. She describes the headache mostly in the back of the head but also at the vertex and occasionally in the front. She describes this as a pressure-like sensation of muscle tightness. It is 5/10 in severity. Nausea is occasional but she does not vomit and does not have any light or sound sensitivity. Taking over-the-counter medication like panel all help temporarily. The last couple of months the headaches frequency is increased and now they occur on a daily basis. She insists she takes not more than one Tylenol tablet a day and does not take any other over-the-counter pain medications. She denies any complaints focal symptoms in the form of loss of vision blurred vision speech difficulty extremity weakness gait or balance problem. She has no prior history of known migraine headaches. She denies history of significant head injury, concussion, seizure, stroke, TIA or loss of consciousness. She has not had any brain imaging studies done and has not seen a neurologist. 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 She does admit to being under a lot of stress the last couple of years since her husband has colorectal cancer. He has  Colostomy and has had several problems with that since. She likes to exercise but does not particpate in regular activities for stress relaxation. She denies blurred vision, loss of vision , scalp  tenderness, jaw claudication or myalgias. She does have chronic joint pain due to arthritis. Update 05/21/2015 : She returns for follow-up after initial consultation visit 2 and of months ago. She reports improvement in her headaches with Topamax but she has not been consistently taking it. She seems to tolerating it well. She has had some flareup in her headaches in the last week or 2 since she is recovering from a upper respiratory tract viral illness. She has also not been doing neck  stretching exercises on a regular basis. She did have MRI scan of the brain done on 03/19/15 which I personally reviewed showed only mild changes of small vessel disease without significant abnormality. She has no new complaints today. ROS:   14 system review of systems is positive for   Headache,  runny nose, cough, joint pain, aching muscles and all other systems negative  PMH:  Past Medical History  Diagnosis Date  . ALLERGIC RHINITIS   . ANXIETY   . BACTEREMIA, MYCOBACTERIUM AVIUM COMPLEX   . BRONCHIECTASIS   . COLONIC POLYPS, HX OF   . GERD   . GOUT   . HYPERLIPIDEMIA   . HYPOTHYROIDISM   . Irritable bowel syndrome   . OSTEOPENIA   . Fundic gland polyps of stomach, benign   . Headache   . Arthritis     Social History:  Social History   Social History  . Marital Status: Married    Spouse Name: N/A  . Number of Children: 3  . Years of Education: N/A  Occupational History  . Retired    Social History Main Topics  . Smoking status: Never Smoker   . Smokeless tobacco: Never Used     Comment: Married, Housewife  . Alcohol Use: No  . Drug Use: No  . Sexual Activity: Not on file   Other Topics Concern  . Not on file   Social History Narrative    Medications:   Current Outpatient Prescriptions on File Prior to Visit  Medication Sig Dispense Refill  . calcium carbonate (OS-CAL) 600 MG TABS Take 600 mg by mouth daily.     . cetirizine (ZYRTEC) 10 MG tablet Take 10 mg by mouth daily.        . CHROMIUM PO Take by mouth daily.    . diazepam (VALIUM) 5 MG tablet TAKE ONE TABLET BY MOUTH EVERY 12 HOURS AS NEEDED FOR ANXIETY 30 tablet 0  . docusate sodium (COLACE) 100 MG capsule Take 100 mg by mouth daily.    Marland Kitchen levothyroxine (SYNTHROID, LEVOTHROID) 50 MCG tablet TAKE ONE TABLET BY MOUTH EVERY OTHER DAY. 90 tablet 2  . levothyroxine (SYNTHROID, LEVOTHROID) 75 MCG tablet TAKE ONE TABLET BY MOUTH EVERY OTHER DAY- ALTERNATE WITH 50MCG. 90 tablet 2  . OMEGA 3 1000 MG CAPS Take by mouth daily.      . pantoprazole (PROTONIX) 40 MG tablet Take 30 minutes prior to breakfast 90 tablet 1  . Psyllium (METAMUCIL PO) Take by mouth every other day.    . simvastatin (ZOCOR) 20 MG tablet Take 1 tablet (20 mg total) by mouth at bedtime. 90 tablet 3  . topiramate (TOPAMAX) 25 MG tablet Take 1 tablet (25 mg total) by mouth 2 (two) times daily. 120 tablet 3   No current facility-administered medications on file prior to visit.    Allergies:   Allergies  Allergen Reactions  . Avelox [Moxifloxacin Hcl In Nacl]     Rash- unsure if truly had a reaction  . Ethambutol Hcl     REACTION: stomach pain  . Moxifloxacin     REACTION: mild rash - causal??    Physical Exam General: well developed, well nourished elderly Caucasian lady, seated, in no evident distress Head: head normocephalic and atraumatic.   Neck: supple with no carotid or supraclavicular bruits Cardiovascular: regular rate and rhythm, no murmurs Musculoskeletal: no deformity. Mild tightness of posterior neck and scapular muscles. Skin:  no rash/petichiae Vascular:  Normal pulses all extremities  Neurologic Exam Mental Status: Awake and fully alert. Oriented to place and time. Recent and remote memory intact. Attention span, concentration and fund of knowledge appropriate. Mood and affect appropriate.  Cranial Nerves: Fundoscopic exam not done . Pupils equal, briskly reactive to light. Extraocular movements full without nystagmus. Visual  fields full to confrontation. Hearing intact. Facial sensation intact. Face, tongue, palate moves normally and symmetrically.  Motor: Normal bulk and tone. Normal strength in all tested extremity muscles. Sensory.: intact to touch , pinprick , position and vibratory sensation.  Coordination: Rapid alternating movements normal in all extremities. Finger-to-nose and heel-to-shin performed accurately bilaterally. Gait and Station: Arises from chair without difficulty. Stance is normal. Gait demonstrates normal stride length and balance . Able to heel, toe and tandem walk without difficulty.  Reflexes: 1+ and symmetric. Toes downgoing.       ASSESSMENT: 67 year  Caucasian lady with  posterior headaches likely muscle tension headaches.    PLAN:  I had a long discussion with the patient with regards to her muscle tension headaches and  strongly encouraged her to do regular neck stretching exercises. She was advised to continue Topamax 25 mg twice daily for 2 weeks till she recovers fully from her ongoing viral illness and then reduce it to once a day after that. She will return for follow-up in 6 months or call earlier if necessary.  Antony Contras, MD Note: This document was prepared with digital dictation and possible smart phrase technology. Any transcriptional errors that result from this process are unintentional.

## 2015-05-21 NOTE — Patient Instructions (Signed)
I had a long discussion with the patient with regards to her muscle tension headaches and strongly encouraged her to do regular neck stretching exercises. She was advised to continue Topamax 25 mg twice daily for 2 weeks till she recovers fully from her ongoing viral illness and then reduce it to once a day after that. She will return for follow-up in 6 months or call earlier if necessary.  Tension Headache A tension headache is a feeling of pain, pressure, or aching that is often felt over the front and sides of the head. The pain can be dull, or it can feel tight (constricting). Tension headaches are not normally associated with nausea or vomiting, and they do not get worse with physical activity. Tension headaches can last from 30 minutes to several days. This is the most common type of headache. CAUSES The exact cause of this condition is not known. Tension headaches often begin after stress, anxiety, or depression. Other triggers may include:  Alcohol.  Too much caffeine, or caffeine withdrawal.  Respiratory infections, such as colds, flu, or sinus infections.  Dental problems or teeth clenching.  Fatigue.  Holding your head and neck in the same position for a long period of time, such as while using a computer.  Smoking. SYMPTOMS Symptoms of this condition include:  A feeling of pressure around the head.  Dull, aching head pain.  Pain felt over the front and sides of the head.  Tenderness in the muscles of the head, neck, and shoulders. DIAGNOSIS This condition may be diagnosed based on your symptoms and a physical exam. Tests may be done, such as a CT scan or an MRI of your head. These tests may be done if your symptoms are severe or unusual. TREATMENT This condition may be treated with lifestyle changes and medicines to help relieve symptoms. HOME CARE INSTRUCTIONS Managing Pain  Take over-the-counter and prescription medicines only as told by your health care  provider.  Lie down in a dark, quiet room when you have a headache.  If directed, apply ice to the head and neck area:  Put ice in a plastic bag.  Place a towel between your skin and the bag.  Leave the ice on for 20 minutes, 2-3 times per day.  Use a heating pad or a hot shower to apply heat to the head and neck area as told by your health care provider. Eating and Drinking  Eat meals on a regular schedule.  Limit alcohol use.  Decrease your caffeine intake, or stop using caffeine. General Instructions  Keep all follow-up visits as told by your health care provider. This is important.  Keep a headache journal to help find out what may trigger your headaches. For example, write down:  What you eat and drink.  How much sleep you get.  Any change to your diet or medicines.  Try massage or other relaxation techniques.  Limit stress.  Sit up straight, and avoid tensing your muscles.  Do not use tobacco products, including cigarettes, chewing tobacco, or e-cigarettes. If you need help quitting, ask your health care provider.  Exercise regularly as told by your health care provider.  Get 7-9 hours of sleep, or the amount recommended by your health care provider. SEEK MEDICAL CARE IF:  Your symptoms are not helped by medicine.  You have a headache that is different from what you normally experience.  You have nausea or you vomit.  You have a fever. SEEK IMMEDIATE MEDICAL CARE IF:  Your headache becomes severe.  You have repeated vomiting.  You have a stiff neck.  You have a loss of vision.  You have problems with speech.  You have pain in your eye or ear.  You have muscular weakness or loss of muscle control.  You lose your balance or you have trouble walking.  You feel faint or you pass out.  You have confusion.   This information is not intended to replace advice given to you by your health care provider. Make sure you discuss any questions you  have with your health care provider.   Document Released: 05/04/2005 Document Revised: 01/23/2015 Document Reviewed: 08/27/2014 Elsevier Interactive Patient Education [employer].

## 2015-05-30 ENCOUNTER — Telehealth: Payer: Self-pay | Admitting: Internal Medicine

## 2015-05-30 NOTE — Telephone Encounter (Signed)
Dawn,  Would you mind looking at this?  Patient was scheduled for an acute visit with dr Asa Lente on 02/12/2015 specifically for headaches. Dr. Asa Lente did an AWV [phone]) instead. It is clear that the patient came in for recurring headaches and had no intent of getting a AWV. Dr Asa Lente is gone now, so i cant ask her to addend the chart. How would we handle this?   The patient should not be held responsible for this.Marland Kitchen

## 2015-05-30 NOTE — Telephone Encounter (Signed)
Patient states that last OV with Dr. Asa Lente was for headaches and should not have been for a wellness.  States that the visit was coded as a wellness and her insurance will not cover.  Can you please look into this.

## 2015-06-05 ENCOUNTER — Ambulatory Visit: Payer: Medicare Other

## 2015-06-11 NOTE — Telephone Encounter (Signed)
Hi Dustin, I'm helping Dawn while she's on PAL. For 02/12/2015 visit, an AWV + problem oriented visit [id number] were charged. On the note documentation it states the patient visited for annual visit and then documented the patient's headaches. Dr Katheren Puller documentation does support both charges; however, the last AWV [phone]) before 02/12/15 was billed on 03/27/14 which was not within the 11 month time frame per CMS. With that said, Medicare will not pay for the G0439 visit. I'm not showing the patient signed an ABN. If that's the case, then the G0439 would need to be written off.

## 2015-06-11 NOTE — Telephone Encounter (Signed)
Thanks for looking at this [facility]. Ill contact charge correction

## 2015-06-20 ENCOUNTER — Ambulatory Visit
Admission: RE | Admit: 2015-06-20 | Discharge: 2015-06-20 | Disposition: A | Discharge status: Home / Self Care | Payer: Medicare Other | Source: Ambulatory Visit

## 2015-06-20 DIAGNOSIS — Z1231 Encounter for screening mammogram for malignant neoplasm of breast: Secondary | ICD-10-CM

## 2015-06-28 DIAGNOSIS — L570 Actinic keratosis: Secondary | ICD-10-CM | POA: Diagnosis not present

## 2015-06-28 DIAGNOSIS — B360 Pityriasis versicolor: Secondary | ICD-10-CM | POA: Diagnosis not present

## 2015-07-02 ENCOUNTER — Ambulatory Visit: Payer: Medicare Other | Admitting: Internal Medicine

## 2015-07-17 ENCOUNTER — Encounter: Payer: Self-pay | Admitting: Physician Assistant

## 2015-07-17 ENCOUNTER — Ambulatory Visit (INDEPENDENT_AMBULATORY_CARE_PROVIDER_SITE_OTHER): Payer: Medicare Other | Admitting: Physician Assistant

## 2015-07-17 VITALS — BP 110/80 | HR 72 | Ht 61.0 in | Wt 117.1 lb

## 2015-07-17 DIAGNOSIS — K5909 Other constipation: Secondary | ICD-10-CM

## 2015-07-17 DIAGNOSIS — K219 Gastro-esophageal reflux disease without esophagitis: Secondary | ICD-10-CM

## 2015-07-17 DIAGNOSIS — K648 Other hemorrhoids: Secondary | ICD-10-CM | POA: Diagnosis not present

## 2015-07-17 MED ORDER — PANTOPRAZOLE SODIUM 40 MG PO TBEC: DELAYED_RELEASE_TABLET | ORAL | Status: DC

## 2015-07-17 MED ORDER — HYDROCORTISONE 2.5 % RE CREA: 1.0000 "application " | TOPICAL_CREAM | Freq: Two times a day (BID) | RECTAL | Status: DC

## 2015-07-17 NOTE — Progress Notes (Signed)
Patient ID: Marilyn Blake, female   DOB: 1940-07-25, 75 y.o.   MRN: [id number]     History of Present Illness: Marilyn Blake is a delightful 75 year old female who was last seen in the office in July 2016. She had had a colonoscopy by Dr. Deatra Ina in January 2007 which was normal. In 2012 she had a colonoscopy that Dr. Oletta Lamas of [facility]. There were no angiodysplasia, diverticuli, mass, polyps, or tumor in the entire colon, but she was noted to have internal hemorrhoids. At her visit in July she was complaining of rectal pain and was noted to have an anal fissure and internal hemorrhoids. She was given a trial of diltiazem ointment which provided good relief. She is here today stating that her stools are again hard and she is having some rectal discomfort. She does not have the severe pain like she did with her anal fissure, but she has a sensation of incomplete evacuation. She has not had any bright red blood per rectum. Her GERD has been under good control and she has no complaints. She did have an upper endoscopy by Dr. Carlean Purl in July 2016 which showed numerous benign-appearing gastric polyps, tortuous esophagus, otherwise normal EGD. Pathology showed fundic gland polyps in the setting of mild chronic gastritis. Negative for H. pylori. Negative for intestinal metaplasia or malignancy.   Past Medical History  Diagnosis Date  . ALLERGIC RHINITIS   . ANXIETY   . BACTEREMIA, MYCOBACTERIUM AVIUM COMPLEX   . BRONCHIECTASIS   . COLONIC POLYPS, HX OF   . GERD   . GOUT   . HYPERLIPIDEMIA   . HYPOTHYROIDISM   . Irritable bowel syndrome   . OSTEOPENIA   . Fundic gland polyps of stomach, benign   . Headache   . Arthritis     Past Surgical History  Procedure Laterality Date  . Thyroidectomy      Hurthele cell tumor  . Abdominal hysterectomy    . Bladder suspension    . Carpal tunnel release Left   . Mass excision Right 08/10/2013    Procedure: DEBRIDE DISTAL INTERPHALANGEAL JOINT/EXCISION MUCOID  CYST RIGHT LONG FINGER and right small finger;  Surgeon: Cammie Sickle., MD;  Location: [facility];  Service: Orthopedics;  Laterality: Right;  . Colonoscopy    . Upper gastrointestinal endoscopy     Family History  Problem Relation Age of Onset  . Cervical cancer Mother 6     died age 66  . Heart attack Father 60  . Colon cancer Neg Hx   . Esophageal cancer Neg Hx   . Stomach cancer Neg Hx   . Rectal cancer Neg Hx    Social History  Substance Use Topics  . Smoking status: Never Smoker   . Smokeless tobacco: Never Used     Comment: Married, Housewife  . Alcohol Use: No   Current Outpatient Prescriptions  Medication Sig Dispense Refill  . cetirizine (ZYRTEC) 10 MG tablet Take 10 mg by mouth daily.      . CHROMIUM PO Take by mouth daily.    . diazepam (VALIUM) 5 MG tablet TAKE ONE TABLET BY MOUTH EVERY 12 HOURS AS NEEDED FOR ANXIETY 30 tablet 0  . docusate sodium (COLACE) 100 MG capsule Take 100 mg by mouth daily.    Marland Kitchen levothyroxine (SYNTHROID, LEVOTHROID) 50 MCG tablet TAKE ONE TABLET BY MOUTH EVERY OTHER DAY. 90 tablet 2  . levothyroxine (SYNTHROID, LEVOTHROID) 75 MCG tablet TAKE ONE TABLET BY MOUTH EVERY OTHER  DAY- ALTERNATE WITH 50MCG. 90 tablet 2  . Multiple Minerals-Vitamins (CALCIUM & VIT D3 BONE HEALTH PO) Take by mouth.    . OMEGA 3 1000 MG CAPS Take by mouth daily.      . pantoprazole (PROTONIX) 40 MG tablet Take 30 minutes prior to breakfast 30 tablet 6  . Psyllium (METAMUCIL PO) Take by mouth every other day.    . Sennosides (SENOKOT PO) Take by mouth.    . simvastatin (ZOCOR) 20 MG tablet Take 1 tablet (20 mg total) by mouth at bedtime. 90 tablet 3  . topiramate (TOPAMAX) 25 MG tablet Take 1 tablet (25 mg total) by mouth 2 (two) times daily. 120 tablet 3  . hydrocortisone (ANUSOL-HC) 2.5 % rectal cream Place 1 application rectally 2 (two) times daily. 30 g 0   No current facility-administered medications for this visit.   Allergies  Allergen  Reactions  . Avelox [Moxifloxacin Hcl In Nacl]     Rash- unsure if truly had a reaction  . Ethambutol Hcl     REACTION: stomach pain  . Moxifloxacin     REACTION: mild rash - causal??     Review of Systems: Gen: Denies any fever, chills, sweats, anorexia, fatigue, weakness, malaise, weight loss, and sleep disorder CV: Denies chest pain, angina, palpitations, syncope, orthopnea, PND, peripheral edema, and claudication. Resp: Denies dyspnea at rest, dyspnea with exercise, cough, sputum, wheezing, coughing up blood, and pleurisy. GI: Denies vomiting blood, jaundice, and fecal incontinence.   Denies dysphagia or odynophagia. GU : Denies urinary burning, blood in urine, urinary frequency, urinary hesitancy, nocturnal urination, and urinary incontinence. MS: Denies joint pain, limitation of movement, and swelling, stiffness, low back pain, extremity pain. Denies muscle weakness, cramps, atrophy.  Derm: Denies rash, itching, dry skin, hives, moles, warts, or unhealing ulcers.  Psych: Denies depression, anxiety, memory loss, suicidal ideation, hallucinations, paranoia, and confusion. Heme: Denies bruising, bleeding, and enlarged lymph nodes. Neuro:  Denies any headaches, dizziness, paresthesia Endo:  Denies any problems with DM, thyroid, adrenal     Physical Exam: BP 110/80 mmHg  Pulse 72  Ht 5\' 1"  (1.549 m)  Wt 117 lb 2 oz (53.128 kg)  BMI 22.14 kg/m2  LMP 08/10/2013 General: Pleasant, well developed ,female in no acute distress Head: Normocephalic and atraumatic Eyes:  sclerae anicteric, conjunctiva pink  Ears: Normal auditory acuity Lungs: Clear throughout to auscultation Heart: Regular rate and rhythm Abdomen: Soft, non distended, non-tender. No masses, no hepatomegaly. Normal bowel sounds Rectal: Small, nonthrombosed, easily reducible internal hemorrhoid. Musculoskeletal: Symmetrical with no gross deformities  Extremities: No edema  Neurological: Alert oriented x 4, grossly  nonfocal Psychological:  Alert and cooperative. Normal mood and affect  Assessment and Recommendations: #1 GERD. Symptoms currently under good control with pantoprazole and she will continue this regimen.  2. Constipation. She will use Mira lax one to 2 capfuls in water daily. She has been instructed to titrate the dose to the desired effect. She will also use Colace 1 capsule at bedtime if needed. She has been instructed to add P fruits to her diet and to increase her water intake.  #3. Internal hemorrhoids. She will be given a trial of Anusol HC cream to apply rectally twice daily for 10 days as needed.        Sheniah Supak, Vita Barley PA-C 07/17/2015,

## 2015-07-17 NOTE — Patient Instructions (Signed)
We have sent the following medications to your pharmacy for you to pick up at your convenience:  Pantoprazole 40 mg 30 mins before breakfast.  Please purchase the following medications over the counter and take as directed: Miralax 1-2 capfuls in water daily.  Colace 1 capsule at bedtime. . Tucks wipes.   Try to eat more "P" fruits: peaches, pears, prunes, plums, pineapple.   Please follow up with Dr. Carlean Purl in 2 months.

## 2015-07-19 NOTE — Progress Notes (Signed)
Agree w/ Ms. Hvozdovic's note and mangement.  

## 2015-08-02 ENCOUNTER — Encounter: Payer: Self-pay | Admitting: Internal Medicine

## 2015-08-02 ENCOUNTER — Ambulatory Visit (INDEPENDENT_AMBULATORY_CARE_PROVIDER_SITE_OTHER): Payer: Medicare Other | Admitting: Internal Medicine

## 2015-08-02 VITALS — BP 124/62 | HR 67 | Temp 98.3°F | Resp 16 | Ht 62.0 in | Wt 118.1 lb

## 2015-08-02 DIAGNOSIS — E890 Postprocedural hypothyroidism: Secondary | ICD-10-CM | POA: Diagnosis not present

## 2015-08-02 MED ORDER — SIMVASTATIN 20 MG PO TABS: 20.0000 mg | ORAL_TABLET | Freq: Every day | ORAL | Status: DC

## 2015-08-02 MED ORDER — DIAZEPAM 5 MG PO TABS: ORAL_TABLET | ORAL | Status: DC

## 2015-08-02 MED ORDER — LEVOTHYROXINE SODIUM 50 MCG PO TABS: ORAL_TABLET | ORAL | Status: DC

## 2015-08-02 MED ORDER — LEVOTHYROXINE SODIUM 75 MCG PO TABS: ORAL_TABLET | ORAL | Status: DC

## 2015-08-02 NOTE — Assessment & Plan Note (Signed)
Will check TSH at next visit, last at goal. Taking 50 mcg alternating with 75 mcg daily. No symptoms of over or under replacement.

## 2015-08-02 NOTE — Progress Notes (Signed)
   Subjective:    Patient ID: Marilyn Blake, female    DOB: 1941/02/23, 75 y.o.   MRN: [id number]  HPI The patient is a 75 YO female coming in for her thyroid. She has been stable on alternating 50 mcg and 75 mcg daily for some time. She denies excessive fatigue. She does have mild constipation but has for her whole life. Denies tremors or diarrhea.   Review of Systems  Constitutional: Negative.   Respiratory: Negative.   Cardiovascular: Negative.   Gastrointestinal: Positive for constipation.       Intermittent  Musculoskeletal: Positive for arthralgias. Negative for myalgias and back pain.  Skin: Negative.   Neurological: Negative.       Objective:   Physical Exam  Constitutional: She is oriented to person, place, and time. She appears well-developed and well-nourished.  HENT:  Head: Normocephalic and atraumatic.  Eyes: EOM are normal.  Neck: Normal range of motion.  Cardiovascular: Normal rate and regular rhythm.   Pulmonary/Chest: Effort normal and breath sounds normal. No respiratory distress. She has no wheezes. She has no rales.  Abdominal: Soft. Bowel sounds are normal. She exhibits no distension. There is no tenderness. There is no rebound.  Neurological: She is alert and oriented to person, place, and time.  Skin: Skin is warm and dry.   Filed Vitals:   08/02/15 0958  BP: 124/62  Pulse: 67  Temp: 98.3 F (36.8 C)  TempSrc: Oral  Resp: 16  Height: 5\' 2"  (1.575 m)  Weight: 118 lb 1.9 oz (53.579 kg)  SpO2: 98%      Assessment & Plan:

## 2015-08-02 NOTE — Patient Instructions (Signed)
We have sent in the refills and will see you back in the fall for a physical.

## 2015-08-02 NOTE — Progress Notes (Signed)
Pre visit review using our clinic review tool, if applicable. No additional management support is needed unless otherwise documented below in the visit note. 

## 2015-08-26 ENCOUNTER — Other Ambulatory Visit (INDEPENDENT_AMBULATORY_CARE_PROVIDER_SITE_OTHER): Payer: Medicare Other

## 2015-08-26 ENCOUNTER — Telehealth: Payer: Self-pay | Admitting: Internal Medicine

## 2015-08-26 ENCOUNTER — Ambulatory Visit (INDEPENDENT_AMBULATORY_CARE_PROVIDER_SITE_OTHER): Payer: Medicare Other | Admitting: Internal Medicine

## 2015-08-26 ENCOUNTER — Encounter: Payer: Self-pay | Admitting: Internal Medicine

## 2015-08-26 VITALS — BP 118/76 | HR 74 | Ht 63.0 in | Wt 117.6 lb

## 2015-08-26 DIAGNOSIS — J479 Bronchiectasis, uncomplicated: Secondary | ICD-10-CM | POA: Diagnosis not present

## 2015-08-26 DIAGNOSIS — E0590 Thyrotoxicosis, unspecified without thyrotoxic crisis or storm: Secondary | ICD-10-CM | POA: Diagnosis not present

## 2015-08-26 DIAGNOSIS — E039 Hypothyroidism, unspecified: Secondary | ICD-10-CM

## 2015-08-26 LAB — TSH: TSH: 1.46 u[IU]/mL (ref 0.35–4.50)

## 2015-08-26 LAB — T4, FREE: Free T4: 0.93 ng/dL (ref 0.60–1.60)

## 2015-08-26 NOTE — Telephone Encounter (Signed)
Left message with husband to let patient know she can come in ahead of her appt and have blood drawn.

## 2015-08-26 NOTE — Patient Instructions (Signed)
Order- lab- TSH    Dx hyperthyroid  Suggest you get next available appointment with Dr Sharlet Salina, in case this feeling of shortness of breath and rapid heart-beat continues.

## 2015-08-26 NOTE — Telephone Encounter (Signed)
Orders placed.

## 2015-08-26 NOTE — Progress Notes (Signed)
Patient ID: Marilyn Blake, female    DOB: 02-Jan-1941, 75 y.o.   MRN: [id number]  HPI 02/03/2011-75 year old female never smoker followed for bronchiectasis, history of MAIC bronchitis, allergic rhinitis. Last here 08/04/2010 Chest x-ray in September of 2011 had shown bilateral scarring, bronchiectasis in the mid zones bilaterally CXR 08/04/2010-stable bronchiectasis, scarring, micro-nodularities in the small area of cavitary change in the right upper lobe consistent with chronic Mycobacterium infection. Acute visit-2 days of chills or lays temperature 100.8 yesterday, sore throat and nasal congestion. Cough is productive of some yellow sputum. No GI complaint.  06/16/11- 75 year old female never smoker followed for bronchiectasis, history of MAIC bronchitis, allergic rhinitis, insomnia. Her breathing has been comfortable. Not much daily cough. More easily winded with activities of daily living over the last 2 weeks but nothing very specific. A few vague twinge pains high in the left upper chest without palpitation or swelling. She has had to have a lot of dental work this winter and has often been uncomfortable from that and her age-related arthritis complaints. As a result she is not sleeping as well. She has taken occasional diazepam, usually one half tablet. We discussed alternatives including a short half-life medication that would be out of her system in the morning.  10/13/11- 75 year old female never smoker followed for bronchiectasis, history of MAIC bronchitis, allergic rhinitis, insomnia. Gets more winded than usual with activity-states she is very active overall. Last weekend had episode of coughing up blood(rich red blood) and then stopped-no more episodes since(Had SOB with it as well)  CXR 06/23/11- 75 year old female never smoker followed for bronchiectasis, history of MAIC bronchitis, allergic rhinitis, insomnia. Gradually more short of breath without increased cough. Notices it   especially after a day of yard work Did cough up some red blood mixed in clear phlegm,  one time, a week or more ago. Some pain left upper anterior chest sounds pleuritic. Few night sweats. CXR- 07/13/11- reviewed images with her IMPRESSION:  Stable chronic lung disease. No acute cardiopulmonary process.  Original Report Authenticated By: Vivia Ewing, M.D.   02/15/12-  75 year old female never smoker followed for bronchiectasis, history of MAIC bronchitis, allergic rhinitis, insomnia.  Pt states breathing has improved since last visit. She has had no more hemoptysis since last visit. Cough is prod with minimal light green sputum.  Good summer. No blood since last year. Seasonal sneezing and blowing. Husband being treated for colon cancer.  01/18/13-  75 year old female never smoker followed for bronchiectasis, history of MAIC bronchitis, allergic rhinitis, insomnia. FOLLOWS FOR: Chest pain, wheezing, SOB and cough w/mucus yellow in color. Some f/c/s beginning last p.m worsened More cough last week. BR 11/15/2012 for chest pain negative. Last night cough, soreness left upper anterior chest with low-grade fever and sweat. Takes Prilosec and not aware of reflux. Feels run down. Husband dealing with colorectal cancer which is stress for them. CXR 11/15/12 IMPRESSION:  Chronic opacities with bronchiectasis, most prominent in the right  middle lobe and lingula, suspicious for atypical infection such as  MAI.  No definite superimposed opacities suspicious for pneumonia.  Original Report Authenticated By: Julian Hy, M.D.  10/23/13-75 year old female never smoker followed for bronchiectasis, recurrent MAIC bronchitis, allergic rhinitis, insomnia. FOLLOWS FOR: SOB and left sided CP since lat OV. Has been under more stress as well 3 weeks increased DOE hills, dry cough, night sweats. L parasternal soreness with reaching or deep breath.  Sputum cx 9 4/14 + MAIC, neg routine flora, neg  fungi  03/15/14-  75 year old female never smoker followed for bronchiectasis, recurrent MAIC bronchitis, allergic rhinitis, insomnia. FOLLOW FOR: Patient stilll has some sob,cough and runny nose. She has had headache  CXR 10/23/13 IMPRESSION:  There are stable changes of COPD. Atelectasis versus scarring in the  lingula and right middle lobe is present and stable.  Electronically Signed  By: David Martinique  On: 10/23/2013 13:29   01/31/15-  75 year old female never smoker followed for bronchiectasis, recurrent MAIC bronchitis, allergic rhinitis, insomnia, complicated by hypothyroid, IBS Follows For: pt states shes been having headaches with runny nose and dry cough. pt has been using flonase. pt states shes been wheezing at times no c/o SOB.  Originally + MAIC by [employer] 2010 and treated 14 months with cipro, zith. Sputum + again 2014.  Intermittent pains behind right ear and frontal headache. Zyrtec and Flonase keep rhinitis controlled. Saw ENT 3 months ago for the headache Occasional cough, clear mucus with no blood. Little shortness of breath. She doubts need for an inhaler. Denies night sweats and fever.  08/26/2015-75 year old female never smoker followed for bronchiectasis, recurrent MAIC bronchitis, allergic rhinitis, insomnia, complicated by hypothyroid, IBS ACUTE VISIT: Pt states she has noticed increased SOB for the 4-5 days;felt like her heart was skipping a beat at the same time; no energy as well. Pt took ASA 81mg  and Valium and it settled down. Pt noted this happened in the past when her Thyroid levels were "off" and her medication had been changed.  Review of Systems- see HPI Constitutional:   No-   weight loss, +night sweats, , chills, +fatigue, lassitude. HEENT:   +  headaches, difficulty swallowing, tooth/dental problems, sore throat,       No-  sneezing, itching, ear ache, +nasal congestion, post nasal drip,  CV:  +atypicalchest pain, orthopnea, PND, swelling in lower  extremities, anasarca,dizziness, palpitations Resp: + shortness of breath with exertion or at rest.             + productive cough,   non-productive cough,  no- coughing up of blood.              No- change in color of mucus.  No- wheezing.   Skin: No-   rash or lesions. GI:  No-   heartburn, indigestion, abdominal pain, nausea, vomiting,  GU:  MS:  No-   joint pain or swelling. . Neuro- grossly normal to observation,   Psych:  No- change in mood or affect. No depression or anxiety.  No memory loss.  Objective:   Physical Exam General- Alert, Oriented, Affect-appropriate, Distress- none acute, petite lady Skin- rash-none, lesions- none, excoriation- none Lymphadenopathy- none Head- atraumatic            Eyes- Gross vision intact, PERRLA, conjunctivae clear secretions            Ears- Hearing, canals- normal            Nose- Clear, No-Septal dev, mucus, polyps, erosion, perforation             Throat- Mallampati II , mucosa clear- not red , drainage- none, tonsils- atrophic, Neck- flexible , trachea midline, no stridor , thyroid nl, carotid no bruit Chest - symmetrical excursion , unlabored           Heart/CV- RRR , no murmur , no gallop  , no rub, nl s1 s2                           -  JVD- none , edema- none, stasis changes- none, varices- none           Lung- clear, unlabored, wheeze- none, cough- none , dullness-none, rub-none           Chest wall- Not tender pressure L parasternal joints, Abd- Br/ Gen/ Rectal- Not done, not indicated Extrem- cyanosis- none, clubbing, none, atrophy- none, strength- nl Neuro- grossly intact to observation

## 2015-08-26 NOTE — Telephone Encounter (Signed)
Pt has been feeling very anxious and she feels her thyroid needs checked. I scheduled her with Dr. Jenny Reichmann tomorrow at 5:15, which is the next available appt. Since the lab will be closed after her appt, we were wondering if lab orders can be put in ahead of time so she can come before her appt.  Please advise

## 2015-08-27 ENCOUNTER — Ambulatory Visit: Payer: Medicare Other | Admitting: Internal Medicine

## 2015-09-01 NOTE — Assessment & Plan Note (Signed)
No obvious exacerbation. She has felt a little more short of breath but does not seem to have an acute infection

## 2015-09-01 NOTE — Assessment & Plan Note (Signed)
She will discuss her thyroid concerns with her primary physician. I agreed to send a TSH on her so that would be available for further discussion. If she keeps feeling palpitations then they may want to talk with cardiology and possibly get an event recorder.

## 2015-09-09 ENCOUNTER — Ambulatory Visit (INDEPENDENT_AMBULATORY_CARE_PROVIDER_SITE_OTHER): Payer: Medicare Other | Admitting: Internal Medicine

## 2015-09-09 ENCOUNTER — Encounter: Payer: Self-pay | Admitting: Internal Medicine

## 2015-09-09 ENCOUNTER — Other Ambulatory Visit (INDEPENDENT_AMBULATORY_CARE_PROVIDER_SITE_OTHER): Payer: Medicare Other

## 2015-09-09 VITALS — BP 112/72 | HR 75 | Temp 97.9°F | Resp 12 | Ht 62.0 in | Wt 118.1 lb

## 2015-09-09 DIAGNOSIS — R002 Palpitations: Secondary | ICD-10-CM | POA: Diagnosis not present

## 2015-09-09 DIAGNOSIS — E785 Hyperlipidemia, unspecified: Secondary | ICD-10-CM

## 2015-09-09 LAB — COMPREHENSIVE METABOLIC PANEL
ALT: 18 U/L (ref 0–35)
AST: 25 U/L (ref 0–37)
Albumin: 4.2 g/dL (ref 3.5–5.2)
Alkaline Phosphatase: 95 U/L (ref 39–117)
BUN: 19 mg/dL (ref 6–23)
CHLORIDE: 104 meq/L (ref 96–112)
CO2: 29 meq/L (ref 19–32)
Calcium: 9.7 mg/dL (ref 8.4–10.5)
Creatinine, Ser: 0.75 mg/dL (ref 0.40–1.20)
GFR: 79.81 mL/min (ref >60.00)
GLUCOSE: 87 mg/dL (ref 70–99)
POTASSIUM: 3.9 meq/L (ref 3.5–5.1)
SODIUM: 140 meq/L (ref 135–145)
TOTAL PROTEIN: 7.8 g/dL (ref 6.0–8.3)
Total Bilirubin: 0.4 mg/dL (ref 0.2–1.2)

## 2015-09-09 LAB — LIPID PANEL
CHOL/HDL RATIO: 4
Cholesterol: 204 mg/dL — ABNORMAL HIGH (ref 0–200)
HDL: 54.4 mg/dL (ref >39.00)
LDL CALC: 126 mg/dL — AB (ref 0–99)
NONHDL: 150.09
Triglycerides: 119 mg/dL (ref 0.0–149.0)
VLDL: 23.8 mg/dL (ref 0.0–40.0)

## 2015-09-09 LAB — TROPONIN I: TNIDX: 0 ug/l (ref 0.00–0.06)

## 2015-09-09 LAB — CK: CK TOTAL: 38 U/L (ref 7–177)

## 2015-09-09 NOTE — Patient Instructions (Signed)
The EKG of the heart is the same as before and normal.   We will check some blood work today to make sure that we are not missing anything.   It might be worthwhile for you to go back to talk to the cardiologist to see if they think you need a monitor to check for those abnormal beats we talked about during an episode. We will arrange this.

## 2015-09-09 NOTE — Progress Notes (Signed)
Pre visit review using our clinic review tool, if applicable. No additional management support is needed unless otherwise documented below in the visit note. 

## 2015-09-10 ENCOUNTER — Telehealth: Payer: Self-pay | Admitting: Internal Medicine

## 2015-09-10 ENCOUNTER — Ambulatory Visit: Payer: PRIVATE HEALTH INSURANCE | Admitting: Podiatry

## 2015-09-10 DIAGNOSIS — R002 Palpitations: Secondary | ICD-10-CM | POA: Insufficient documentation

## 2015-09-10 NOTE — Assessment & Plan Note (Signed)
Referral to cardiology for consideration of event monitor for these episodes. Concern that represent PAF given the symptoms. In normal sinus today. Checking labs today and recent labs for thyroid were normal.

## 2015-09-10 NOTE — Telephone Encounter (Signed)
Pt request lab result.

## 2015-09-10 NOTE — Progress Notes (Signed)
   Subjective:    Patient ID: Marilyn Blake, female    DOB: [birth year], 75 y.o.   MRN: [id number]  HPI The patient is a 75 YO female coming in for episodes of weakness, palpitations, and SOB. She started having them about 1 month ago and they were rare. Now they are more common and generally happen in the evening. Not with exercise. They last more than 30 minutes. Nothing makes them better. She does take a valium which seems to relax her and they are gone when it wears off.   Review of Systems  Constitutional: Positive for fatigue.  Respiratory: Positive for shortness of breath. Negative for cough, chest tightness and wheezing.   Cardiovascular: Positive for palpitations. Negative for chest pain and leg swelling.  Gastrointestinal: Negative for diarrhea, constipation and blood in stool.       Intermittent  Musculoskeletal: Positive for arthralgias. Negative for myalgias and back pain.  Skin: Negative.   Neurological: Positive for weakness. Negative for dizziness, seizures, speech difficulty, numbness and headaches.      Objective:   Physical Exam  Constitutional: She is oriented to person, place, and time. She appears well-developed and well-nourished.  HENT:  Head: Normocephalic and atraumatic.  Eyes: EOM are normal.  Neck: Normal range of motion.  Cardiovascular: Normal rate and regular rhythm.   Pulmonary/Chest: Effort normal and breath sounds normal. No respiratory distress. She has no wheezes. She has no rales. She exhibits no tenderness.  Abdominal: Soft. Bowel sounds are normal. She exhibits no distension. There is no tenderness. There is no rebound.  Musculoskeletal: She exhibits no edema.  Neurological: She is alert and oriented to person, place, and time.  Skin: Skin is warm and dry.   Filed Vitals:   09/09/15 1028  BP: 112/72  Pulse: 75  Temp: 97.9 F (36.6 C)  TempSrc: Oral  Resp: 12  Height: 5\' 2"  (1.575 m)  Weight: 118 lb 1.9 oz (53.579 kg)  SpO2: 96%   EKG:  Rate 61, axis normal, intervals normal, p waves so sinus, no ST or T wave change, unchanged from last in 2014.     Assessment & Plan:

## 2015-09-11 NOTE — Telephone Encounter (Signed)
Called and gave patient lab results

## 2015-09-19 ENCOUNTER — Ambulatory Visit (INDEPENDENT_AMBULATORY_CARE_PROVIDER_SITE_OTHER): Payer: Medicare Other | Admitting: Podiatry

## 2015-09-19 ENCOUNTER — Ambulatory Visit (INDEPENDENT_AMBULATORY_CARE_PROVIDER_SITE_OTHER): Payer: Medicare Other

## 2015-09-19 ENCOUNTER — Encounter: Payer: Self-pay | Admitting: Podiatry

## 2015-09-19 VITALS — BP 131/68 | HR 78 | Resp 16

## 2015-09-19 DIAGNOSIS — M722 Plantar fascial fibromatosis: Secondary | ICD-10-CM

## 2015-09-19 MED ORDER — METHYLPREDNISOLONE 4 MG PO TBPK: ORAL_TABLET | ORAL | Status: DC

## 2015-09-19 MED ORDER — MELOXICAM 15 MG PO TABS: 15.0000 mg | ORAL_TABLET | Freq: Every day | ORAL | Status: DC

## 2015-09-19 NOTE — Progress Notes (Signed)
   Subjective:    Patient ID: Marilyn Blake, female    DOB: July 11, 1940, 75 y.o.   MRN: [id number]  HPI: She presents today 3 year history of plantar fasciitis her left heel. She states it has been on and off over the years but has recently developed into more of a continuous constant pain. Mornings are particularly bad and anytime after she gets up from a seated position. She denies trauma to the foot. States that she's had injections before and has had inserts made all to no avail.    Review of Systems  Constitutional: Positive for fatigue.  Neurological: Positive for headaches.  Psychiatric/Behavioral: The patient is nervous/anxious.   All other systems reviewed and are negative.      Objective:   Physical Exam: She presents today stress vital signs stable alert and oriented 3. Pulses are strongly palpable neurologic sensorium is intact muscle strength is normal bilateral. Orthopedic evaluation demonstrates all joints distal to the ankle for range of motion without crepitation. She has pain on palpation medial calcaneal tubercle of the left heel. Radiographs taken today do demonstrate a soft tissue increase in density at the plantar fascia cannula insertion site no fractures noted. Cutaneous evaluation demonstrates supple well-hydrated cutis no erythema edema cellulitis drainage or odor.        Assessment & Plan:  Plantar fasciitis left.  Plan: Discussed etiology and pathology conservative versus surgical therapies. At this point I recommended a cortisone injection to the left heel which was dispensed as a 20 mg dose of Kenalog with local anesthetic. I also wrote a prescription for a Medrol Dosepak to be followed by meloxicam. I also recommended plantar fascia brace to be followed by a night splint. We discussed appropriate shoe gear stretching exercises ice therapy and sugar modifications I will follow-up with her in 1 month.

## 2015-09-25 ENCOUNTER — Telehealth: Payer: Self-pay | Admitting: Internal Medicine

## 2015-09-25 ENCOUNTER — Encounter: Payer: Self-pay | Admitting: Cardiovascular Disease

## 2015-09-25 NOTE — Telephone Encounter (Signed)
New Message   This message is to inform you that we have made 3 consecutive attempts to contact the patient. We have also mailed a letter to the patient to inform them to call in and schedule. Although we were unsuccessful in these attempts we wanted you to be aware of our efforts. Will remove the patient from our referral work queue at this time.    [facility]

## 2015-10-04 NOTE — Telephone Encounter (Signed)
Left message advising patient to call and schedule her appt with [facility].

## 2015-10-04 NOTE — Telephone Encounter (Signed)
Can we call or send a letter advising her to call heartcare.

## 2015-10-09 ENCOUNTER — Ambulatory Visit: Payer: Medicare Other | Admitting: Physician Assistant

## 2015-10-10 ENCOUNTER — Ambulatory Visit (INDEPENDENT_AMBULATORY_CARE_PROVIDER_SITE_OTHER): Payer: Medicare Other | Admitting: Cardiology

## 2015-10-10 ENCOUNTER — Encounter: Payer: Self-pay | Admitting: Cardiology

## 2015-10-10 VITALS — BP 122/70 | HR 67 | Ht 62.0 in | Wt 115.8 lb

## 2015-10-10 DIAGNOSIS — R079 Chest pain, unspecified: Secondary | ICD-10-CM

## 2015-10-10 DIAGNOSIS — R0602 Shortness of breath: Secondary | ICD-10-CM | POA: Insufficient documentation

## 2015-10-10 DIAGNOSIS — R002 Palpitations: Secondary | ICD-10-CM | POA: Diagnosis not present

## 2015-10-10 NOTE — Progress Notes (Signed)
Cardiology Office Note    Date:  10/10/2015   ID:  Marilyn Blake, DOB 01/14/1941, MRN [id number]  PCP:  Hoyt Koch, MD  Cardiologist:  Fransico Him, MD   Chief Complaint  Patient presents with  . Chest Pain  . Shortness of Breath  . Palpitations    History of Present Illness:  Marilyn Blake is a 75 y.o. female who presents today for evaluation of SOB, palpitations and spells of weakness.  She says that her symptoms started in early April.  She has a history of MAI and sees Dr. Annamaria Boots in Pulmonary.  She also has a history of GERD and dyslipidemia.  She says that the palpitations are intermittent and will feel like her heart is beating out of her chest.  She denies any dizziness with the episodes of palpitations.  She is very active walking daily and has noticed exertional fatigue recently.  She has noticed some pressure in her chest at times but is nonexertional and does not radiate.  Sometimes she feels flushed with the chest discomfort but no nausea or diphoresis.      Past Medical History  Diagnosis Date  . ALLERGIC RHINITIS   . ANXIETY   . BACTEREMIA, MYCOBACTERIUM AVIUM COMPLEX   . BRONCHIECTASIS   . COLONIC POLYPS, HX OF   . GERD   . GOUT   . HYPERLIPIDEMIA   . HYPOTHYROIDISM   . Irritable bowel syndrome   . OSTEOPENIA   . Fundic gland polyps of stomach, benign   . Headache   . Arthritis     Past Surgical History  Procedure Laterality Date  . Thyroidectomy      Hurthele cell tumor  . Abdominal hysterectomy    . Bladder suspension    . Carpal tunnel release Left   . Mass excision Right 08/10/2013    Procedure: DEBRIDE DISTAL INTERPHALANGEAL JOINT/EXCISION MUCOID CYST RIGHT LONG FINGER and right small finger;  Surgeon: Cammie Sickle., MD;  Location: [facility];  Service: Orthopedics;  Laterality: Right;  . Colonoscopy    . Upper gastrointestinal endoscopy      Current Medications: Outpatient Prescriptions Prior to Visit  Medication  Sig Dispense Refill  . Calcium Carbonate-Vitamin D (CALCIUM PLUS VITAMIN D PO) Take by mouth.    . cetirizine (ZYRTEC) 10 MG tablet Take 10 mg by mouth daily.      . diazepam (VALIUM) 5 MG tablet TAKE ONE TABLET BY MOUTH EVERY 12 HOURS AS NEEDED FOR ANXIETY 30 tablet 2  . levothyroxine (SYNTHROID, LEVOTHROID) 75 MCG tablet TAKE ONE TABLET BY MOUTH EVERY OTHER DAY- ALTERNATE WITH 50MCG. 45 tablet 3  . meloxicam (MOBIC) 15 MG tablet Take 1 tablet (15 mg total) by mouth daily. 30 tablet 3  . methylPREDNISolone (MEDROL) 4 MG TBPK tablet Tapering 6 day dose pack 21 tablet 0  . Multiple Minerals-Vitamins (CALCIUM & VIT D3 BONE HEALTH PO) Take by mouth. Reported on 09/09/2015    . OMEGA 3 1000 MG CAPS Take by mouth daily.      . pantoprazole (PROTONIX) 40 MG tablet Take 30 minutes prior to breakfast 30 tablet 6  . Psyllium (METAMUCIL PO) Take by mouth every other day.    . Sennosides (SENOKOT PO) Take by mouth.    . simvastatin (ZOCOR) 20 MG tablet Take 1 tablet (20 mg total) by mouth at bedtime. 90 tablet 3   No facility-administered medications prior to visit.     Allergies:   Avelox; Ethambutol  hcl; and Moxifloxacin   Social History   Social History  . Marital Status: Married    Spouse Name: N/A  . Number of Children: 3  . Years of Education: N/A   Occupational History  . Retired    Social History Main Topics  . Smoking status: Never Smoker   . Smokeless tobacco: Never Used     Comment: Married, Housewife  . Alcohol Use: No  . Drug Use: No  . Sexual Activity: Not Asked   Other Topics Concern  . None   Social History Narrative     Family History:  The patient's family history includes Cervical cancer (age of onset: 27) in her mother; Heart attack (age of onset: 51) in her father. There is no history of Colon cancer, Esophageal cancer, Stomach cancer, or Rectal cancer.   ROS:   Please see the history of present illness.    ROS All other systems reviewed and are  negative.   PHYSICAL EXAM:   VS:  BP 122/70 mmHg  Pulse 67  Ht 5\' 2"  (1.575 m)  Wt 115 lb 12.8 oz (52.527 kg)  BMI 21.17 kg/m2  LMP 08/10/2013   GEN: Well nourished, well developed, in no acute distress HEENT: normal Neck: no JVD, carotid bruits, or masses Cardiac: RRR; no murmurs, rubs, or gallops,no edema.  Intact distal pulses bilaterally.  Respiratory:  clear to auscultation bilaterally, normal work of breathing GI: soft, nontender, nondistended, + BS MS: no deformity or atrophy Skin: warm and dry, no rash Neuro:  Alert and Oriented x 3, Strength and sensation are intact Psych: euthymic mood, full affect  Wt Readings from Last 3 Encounters:  10/10/15 115 lb 12.8 oz (52.527 kg)  09/09/15 118 lb 1.9 oz (53.579 kg)  08/26/15 117 lb 9.6 oz (53.343 kg)      Studies/Labs Reviewed:   EKG:  EKG is not ordered today.    Recent Labs: 08/26/2015: TSH 1.46 09/09/2015: ALT 18; BUN 19; Creatinine, Ser 0.75; Potassium 3.9; Sodium 140   Lipid Panel    Component Value Date/Time   CHOL 204* 09/09/2015 1126   TRIG 119.0 09/09/2015 1126   TRIG 67 04/16/2006 1050   HDL 54.40 09/09/2015 1126   CHOLHDL 4 09/09/2015 1126   CHOLHDL 3.4 CALC 04/16/2006 1050   VLDL 23.8 09/09/2015 1126   LDLCALC 126* 09/09/2015 1126   LDLDIRECT 121.7 01/18/2013 1202   LDLDIRECT 142.4 04/16/2006 1050    Additional studies/ records that were reviewed today include:  OV noted from PCP, EKG and labs    ASSESSMENT:    1. Chest pain, unspecified chest pain type   2. SOB (shortness of breath)   3. Palpitations      PLAN:  In order of problems listed above:  1. Chest pain that is somewhat atypical.  It is nonexertional with no associated symptoms and no radiation.  EKG at PCP office showed NSR with nonspecific T wave abnormality.  She does have CRFs including family history of CAD, dyslipidemia and post menopausal state.  I will get a stress myoview to rule out ischemia. 2. SOB - she was seen by  Dr. Annamaria Boots and felt not to be pulmonary in etiology.  Will get a 2D echo to assess LVF and diastolic function 3. Palpitations which could be due to underlying PAF.  Will get 30 day heat monitor to assess further.     Medication Adjustments/Labs and Tests Ordered: Current medicines are reviewed at length with the patient today.  Concerns  regarding medicines are outlined above.  Medication changes, Labs and Tests ordered today are listed in the Patient Instructions below.  There are no Patient Instructions on file for this visit.   Signed, Fransico Him, MD  10/10/2015 9:12 AM    [facility] [address], [place], [place]  [address] Phone: [phone]; Fax: [phone]

## 2015-10-10 NOTE — Patient Instructions (Addendum)
Medication Instructions:  Your physician recommends that you continue on your current medications as directed. Please refer to the Current Medication list given to you today.   Labwork: none  Testing/Procedures: Your physician has requested that you have an echocardiogram. Echocardiography is a painless test that uses sound waves to create images of your heart. It provides your doctor with information about the size and shape of your heart and how well your heart's chambers and valves are working. This procedure takes approximately one hour. There are no restrictions for this procedure.   Your physician has recommended that you wear an event monitor. Event monitors are medical devices that record the heart's electrical activity. Doctors most often [place] these monitors to diagnose arrhythmias. Arrhythmias are problems with the speed or rhythm of the heartbeat. The monitor is a small, portable device. You can wear one while you do your normal daily activities. This is usually used to diagnose what is causing palpitations/syncope (passing out).   Dr. Radford Pax recommends you have a NUCLEAR STRESS TEST.  Follow-Up: Your physician recommends that you schedule a follow-up appointment AS NEEDED with Dr. Radford Pax pending study results.  Any Other Special Instructions Will Be Listed Below (If Applicable).     If you need a refill on your cardiac medications before your next appointment, please call your pharmacy.

## 2015-10-11 ENCOUNTER — Ambulatory Visit: Payer: Medicare Other | Admitting: Cardiovascular Disease

## 2015-10-15 ENCOUNTER — Ambulatory Visit (INDEPENDENT_AMBULATORY_CARE_PROVIDER_SITE_OTHER): Payer: Medicare Other

## 2015-10-15 DIAGNOSIS — R002 Palpitations: Secondary | ICD-10-CM | POA: Diagnosis not present

## 2015-10-17 ENCOUNTER — Telehealth (HOSPITAL_COMMUNITY): Payer: Self-pay | Admitting: *Deleted

## 2015-10-17 ENCOUNTER — Ambulatory Visit: Payer: Medicare Other | Admitting: Podiatry

## 2015-10-17 NOTE — Telephone Encounter (Signed)
Left message on voicemail per DPR in reference to upcoming appointment scheduled on 10/22/15 with detailed instructions given per Myocardial Perfusion Study Information Sheet for the test. LM to arrive 15 minutes early, and that it is imperative to arrive on time for appointment to keep from having the test rescheduled. If you need to cancel or reschedule your appointment, please call the office within 24 hours of your appointment. Failure to do so may result in a cancellation of your appointment, and a $50 no show fee. Phone number given for call back for any questions. Hubbard Robinson, RN

## 2015-10-22 ENCOUNTER — Ambulatory Visit (HOSPITAL_COMMUNITY): Payer: Medicare Other | Attending: Cardiology

## 2015-10-22 DIAGNOSIS — R0602 Shortness of breath: Secondary | ICD-10-CM

## 2015-10-22 DIAGNOSIS — R079 Chest pain, unspecified: Secondary | ICD-10-CM

## 2015-10-22 DIAGNOSIS — R002 Palpitations: Secondary | ICD-10-CM | POA: Insufficient documentation

## 2015-10-22 LAB — MYOCARDIAL PERFUSION IMAGING
CHL CUP MPHR: 144 {beats}/min
CHL CUP NUCLEAR SDS: 0
CHL RATE OF PERCEIVED EXERTION: 17
CSEPEW: 8.9 METS
CSEPHR: 90 %
CSEPPHR: 131 {beats}/min
Exercise duration (min): 7 min
Exercise duration (sec): 15 s
LHR: 0.29
LVDIAVOL: 55 mL (ref 46–106)
LVSYSVOL: 19 mL
Rest HR: 64 {beats}/min
SRS: 1
SSS: 1
TID: 1.08

## 2015-10-22 MED ORDER — TECHNETIUM TC 99M TETROFOSMIN IV KIT
30.8000 | PACK | Freq: Once | INTRAVENOUS | Status: AC | PRN
  Administered 2015-10-22: 30.8 via INTRAVENOUS
  Filled 2015-10-22: qty 31

## 2015-10-22 MED ORDER — TECHNETIUM TC 99M TETROFOSMIN IV KIT
10.6000 | PACK | Freq: Once | INTRAVENOUS | Status: AC | PRN
  Administered 2015-10-22: 11 via INTRAVENOUS
  Filled 2015-10-22: qty 11

## 2015-10-28 ENCOUNTER — Ambulatory Visit (HOSPITAL_COMMUNITY): Payer: Medicare Other | Attending: Cardiovascular Disease

## 2015-10-28 ENCOUNTER — Other Ambulatory Visit: Payer: Self-pay

## 2015-10-28 DIAGNOSIS — R079 Chest pain, unspecified: Secondary | ICD-10-CM | POA: Diagnosis not present

## 2015-10-28 DIAGNOSIS — R0602 Shortness of breath: Secondary | ICD-10-CM | POA: Diagnosis not present

## 2015-10-28 DIAGNOSIS — E785 Hyperlipidemia, unspecified: Secondary | ICD-10-CM | POA: Diagnosis not present

## 2015-10-28 DIAGNOSIS — R002 Palpitations: Secondary | ICD-10-CM | POA: Diagnosis not present

## 2015-10-28 LAB — ECHOCARDIOGRAM COMPLETE
Ao-asc: 30 cm
CHL CUP MV DEC (S): 257
CHL CUP RV SYS PRESS: 30 mmHg
CHL CUP TV REG PEAK VELOCITY: 233 cm/s
E/e' ratio: 11.18
EWDT: 257 ms
FS: 37 % (ref 28–44)
IVS/LV PW RATIO, ED: 1.07
LA ID, A-P, ES: 29 mm
LA diam index: 1.91 cm/m2
LA vol A4C: 16.1 ml
LDCA: 2.84 cm2
LEFT ATRIUM END SYS DIAM: 29 mm
LV E/e' medial: 11.18
LV TDI E'MEDIAL: 4.57
LV e' LATERAL: 6.85 cm/s
LVEEAVG: 11.18
LVOT VTI: 24.3 cm
LVOT peak vel: 109 cm/s
LVOTD: 19 mm
LVOTSV: 69 mL
MV Peak grad: 2 mmHg
MV pk A vel: 106 m/s
MVPKEVEL: 76.6 m/s
PV Reg vel dias: 88.6 cm/s
PW: 7.1 mm — AB (ref 0.6–1.1)
RV LATERAL S' VELOCITY: 9.57 cm/s
TDI e' lateral: 6.85
TR max vel: 233 cm/s

## 2015-10-29 ENCOUNTER — Telehealth: Payer: Self-pay

## 2015-10-29 DIAGNOSIS — R0602 Shortness of breath: Secondary | ICD-10-CM

## 2015-10-29 NOTE — Telephone Encounter (Signed)
Informed patient of results and verbal understanding expressed. BNP scheduled for tomorrow. Patient agrees with treatment plan. 

## 2015-10-29 NOTE — Telephone Encounter (Signed)
-----   Message from Sueanne Margarita, MD sent at 10/28/2015  4:35 PM EDT ----- The stiffness of heart muscle could be causing her SOB.  Please have her come in for a BNP

## 2015-10-30 ENCOUNTER — Other Ambulatory Visit (INDEPENDENT_AMBULATORY_CARE_PROVIDER_SITE_OTHER): Payer: Medicare Other

## 2015-10-30 DIAGNOSIS — R0602 Shortness of breath: Secondary | ICD-10-CM

## 2015-10-30 LAB — BRAIN NATRIURETIC PEPTIDE: Brain Natriuretic Peptide: 33.1 pg/mL (ref <100)

## 2015-11-14 ENCOUNTER — Ambulatory Visit: Payer: Medicare Other | Admitting: Podiatry

## 2015-11-21 ENCOUNTER — Ambulatory Visit: Payer: Medicare Other | Admitting: Neurology

## 2015-11-26 ENCOUNTER — Encounter: Payer: Self-pay | Admitting: Internal Medicine

## 2015-12-03 ENCOUNTER — Encounter: Payer: Self-pay | Admitting: Podiatry

## 2015-12-03 ENCOUNTER — Ambulatory Visit (INDEPENDENT_AMBULATORY_CARE_PROVIDER_SITE_OTHER): Payer: Medicare Other | Admitting: Podiatry

## 2015-12-03 DIAGNOSIS — M722 Plantar fascial fibromatosis: Secondary | ICD-10-CM | POA: Diagnosis not present

## 2015-12-03 NOTE — Progress Notes (Signed)
She presents today for follow-up of her plantar fasciitis to her left foot. She states that is approximately 70% improved. She continues her anti-inflammatories and the use of her plantar fascial brace and night splint.  Objective: Vital signs are stable alert and oriented 3. Minimal tenderness on palpation medial continued tubercle left.  Assessment: Plantar fascitis resolving left.  Plan: I reinjected the left heel today with Kenalog and local anesthetic discussed possible need for orthotics.

## 2015-12-10 ENCOUNTER — Telehealth: Payer: Self-pay | Admitting: Internal Medicine

## 2015-12-10 MED ORDER — HYOSCYAMINE SULFATE 0.125 MG SL SUBL: 0.1250 mg | SUBLINGUAL_TABLET | SUBLINGUAL | 0 refills | Status: DC | PRN

## 2015-12-10 NOTE — Telephone Encounter (Signed)
Patient reports that she has misplaced her levsin rx.that was prescribed by Arta Bruce, PA I sent her in a refill for her chest pain and cramping.  She will keep her follow up with Dr. Carlean Purl 01/02/16

## 2016-01-02 ENCOUNTER — Ambulatory Visit (INDEPENDENT_AMBULATORY_CARE_PROVIDER_SITE_OTHER): Payer: Medicare Other | Admitting: Internal Medicine

## 2016-01-02 ENCOUNTER — Encounter: Payer: Self-pay | Admitting: Internal Medicine

## 2016-01-02 VITALS — BP 110/60 | HR 82 | Ht 61.0 in | Wt 114.0 lb

## 2016-01-02 DIAGNOSIS — R1013 Epigastric pain: Secondary | ICD-10-CM

## 2016-01-02 DIAGNOSIS — K589 Irritable bowel syndrome without diarrhea: Secondary | ICD-10-CM | POA: Diagnosis not present

## 2016-01-02 MED ORDER — PANTOPRAZOLE SODIUM 20 MG PO TBEC: 20.0000 mg | DELAYED_RELEASE_TABLET | Freq: Every day | ORAL | 1 refills | Status: DC

## 2016-01-02 NOTE — Patient Instructions (Signed)
  Per Dr Carlean Purl taper your pantoprazole as follows:  Alternate 40mg  with 20mg  until you run out of your 40mg  tablets.  Then take 20mg  daily for 2 weeks and then take 20mg  every other day for 2 weeks and then stop.  Call us with any questions.    We have sent the following medications to your pharmacy for you to pick up at your convenience: Pantoprazole 20mg .    Ok to use gaviscon as needed.      I appreciate the opportunity to care for you. Silvano Rusk, MD, [facility]

## 2016-01-02 NOTE — Progress Notes (Signed)
   Subjective:    Patient ID: Marilyn Blake, female    DOB: June 19, 1940, 75 y.o.   MRN: [id number] Cc: upper abdominal pain  HPI Called a couple of months ago - was having upper abdominal pain. While aiting for the visit -  She reduced roughage  Also had Abx for dental work ([place] and is much better ? About safety of long-term PPI Not on meloxicam much if at all  Bowel movements generally ok but has some bowel irregularity still Medications, allergies, past medical history, past surgical history, family history and social history are reviewed and updated in the EMR.  Review of Systems As above    Objective:   Physical Exam  BP 110/60   Pulse 82   Ht 5\' 1"  (1.549 m) Comment: measured without shoes  Wt 114 lb (51.7 kg)   LMP 08/10/2013 Comment: post menopausal  BMI 21.54 kg/m  NAD      Assessment & Plan:   Encounter Diagnoses  Name Primary?  . Dyspepsia Yes  . IBS (irritable bowel syndrome)    Better - mostly think functional issues as opposed to GERD  She wants to try to come off PPI  40/20, 20 qd, 20 qod stop over about 1 month - 6 weeks No colon cancer screening needed further  15 minutes time spent with patient > half in counseling coordination of care  I appreciate the opportunity to care for this patient. [id number] Becky Augusta, MD

## 2016-01-04 ENCOUNTER — Encounter: Payer: Self-pay | Admitting: Internal Medicine

## 2016-01-07 ENCOUNTER — Ambulatory Visit: Payer: Medicare Other | Admitting: Podiatry

## 2016-01-15 ENCOUNTER — Encounter: Payer: Medicare Other | Admitting: Cardiovascular Disease

## 2016-01-27 DIAGNOSIS — H5203 Hypermetropia, bilateral: Secondary | ICD-10-CM | POA: Diagnosis not present

## 2016-01-27 DIAGNOSIS — H2513 Age-related nuclear cataract, bilateral: Secondary | ICD-10-CM | POA: Diagnosis not present

## 2016-01-27 DIAGNOSIS — H52223 Regular astigmatism, bilateral: Secondary | ICD-10-CM | POA: Diagnosis not present

## 2016-01-27 DIAGNOSIS — H524 Presbyopia: Secondary | ICD-10-CM | POA: Diagnosis not present

## 2016-01-31 ENCOUNTER — Ambulatory Visit (INDEPENDENT_AMBULATORY_CARE_PROVIDER_SITE_OTHER): Payer: Medicare Other | Admitting: Family

## 2016-01-31 ENCOUNTER — Other Ambulatory Visit: Payer: Medicare Other

## 2016-01-31 ENCOUNTER — Ambulatory Visit: Payer: Medicare Other | Admitting: Internal Medicine

## 2016-01-31 ENCOUNTER — Encounter: Payer: Self-pay | Admitting: Family

## 2016-01-31 VITALS — BP 102/60 | HR 76 | Temp 97.7°F | Resp 16 | Ht 61.0 in | Wt 114.0 lb

## 2016-01-31 DIAGNOSIS — R300 Dysuria: Secondary | ICD-10-CM | POA: Diagnosis not present

## 2016-01-31 DIAGNOSIS — R309 Painful micturition, unspecified: Secondary | ICD-10-CM | POA: Insufficient documentation

## 2016-01-31 LAB — POCT URINALYSIS DIPSTICK
Bilirubin, UA: NEGATIVE
Blood, UA: NEGATIVE
Glucose, UA: NEGATIVE
Ketones, UA: NEGATIVE
NITRITE UA: NEGATIVE
PROTEIN UA: NEGATIVE
SPEC GRAV UA: 1.02
UROBILINOGEN UA: NEGATIVE
pH, UA: 7

## 2016-01-31 MED ORDER — SULFAMETHOXAZOLE-TRIMETHOPRIM 800-160 MG PO TABS: 1.0000 | ORAL_TABLET | Freq: Two times a day (BID) | ORAL | 0 refills | Status: DC

## 2016-01-31 NOTE — Assessment & Plan Note (Addendum)
In office urinalysis positive for leukocytes and negative for nitrites and hematuria. Symptoms and exam are consistent with cystitis. Urine culture sent  for confirmation. Sample of Uribel given. Follow up if symptoms worsen or do not improve.

## 2016-01-31 NOTE — Progress Notes (Signed)
Subjective:    Patient ID: Marilyn Blake, female    DOB: 1940-09-02, 75 y.o.   MRN: [id number]  Chief Complaint  Patient presents with  . Dysuria    dysuria, pressure, urinary frequency, x3 days    HPI:  Marilyn Blake is a 75 y.o. female who  has a past medical history of ALLERGIC RHINITIS; ANXIETY; Arthritis; BACTEREMIA, MYCOBACTERIUM AVIUM COMPLEX; BRONCHIECTASIS; COLONIC POLYPS, HX OF; Fundic gland polyps of stomach, benign; GERD; GOUT; Headache; HYPERLIPIDEMIA; HYPOTHYROIDISM; Irritable bowel syndrome; and OSTEOPENIA. and presents today For an acute office visit.  This is a new problem. Associated symptoms of dysuria, pressure, and urinary frequency of been going on for approximately 3 days. Denies fevers, back pain, or urgency. Modifying factors include cranberry which did not help very much. Course of the symptoms have maintained about the same since initial onset with severity enough to effect her sleep.   Allergies  Allergen Reactions  . Avelox [Moxifloxacin Hcl In Nacl]     Rash- unsure if truly had a reaction  . Ethambutol Hcl     REACTION: stomach pain  . Moxifloxacin     REACTION: mild rash - causal??      Outpatient Medications Prior to Visit  Medication Sig Dispense Refill  . CALCIUM-VITAMIN D PO Take by mouth 1 day or 1 dose.    . cetirizine (ZYRTEC) 10 MG tablet Take 10 mg by mouth daily.      . diazepam (VALIUM) 5 MG tablet TAKE ONE TABLET BY MOUTH EVERY 12 HOURS AS NEEDED FOR ANXIETY 30 tablet 2  . hyoscyamine (LEVSIN SL) 0.125 MG SL tablet Place 1 tablet (0.125 mg total) under the tongue every 4 (four) hours as needed. 30 tablet 0  . levothyroxine (SYNTHROID, LEVOTHROID) 75 MCG tablet TAKE ONE TABLET BY MOUTH EVERY OTHER DAY- ALTERNATE WITH 50MCG. 45 tablet 3  . Multiple Minerals-Vitamins (CALCIUM & VIT D3 BONE HEALTH PO) Take by mouth. Reported on 09/09/2015    . OMEGA 3 1000 MG CAPS Take by mouth daily.      . pantoprazole (PROTONIX) 20 MG tablet Take 1  tablet (20 mg total) by mouth daily before breakfast. 30 tablet 1  . Probiotic Product (PROBIOTIC-10 PO) Take by mouth 1 day or 1 dose.    . Psyllium (METAMUCIL PO) Take by mouth every other day.    . Sennosides (SENOKOT PO) Take by mouth.    . simvastatin (ZOCOR) 20 MG tablet Take 1 tablet (20 mg total) by mouth at bedtime. 90 tablet 3  . amoxicillin (AMOXIL) 500 MG capsule Take 500 mg by mouth 3 (three) times daily.    . meloxicam (MOBIC) 15 MG tablet Take 1 tablet (15 mg total) by mouth daily. 30 tablet 3   No facility-administered medications prior to visit.      Review of Systems  Constitutional: Negative for chills and fever.  Genitourinary: Positive for dysuria and frequency. Negative for flank pain, hematuria and urgency.      Objective:    BP 102/60 (BP Location: Left Arm, Patient Position: Sitting, Cuff Size: Normal)   Pulse 76   Temp 97.7 F (36.5 C) (Oral)   Resp 16   Ht 5\' 1"  (1.549 m)   Wt 114 lb (51.7 kg)   LMP 08/10/2013 Comment: post menopausal  SpO2 96%   BMI 21.54 kg/m  Nursing note and vital signs reviewed.  Physical Exam  Constitutional: She is oriented to person, place, and time. She appears well-developed and well-nourished. No  distress.  Cardiovascular: Normal rate, regular rhythm, normal heart sounds and intact distal pulses.   Pulmonary/Chest: Effort normal and breath sounds normal.  Abdominal: There is no CVA tenderness.  Neurological: She is alert and oriented to person, place, and time.  Skin: Skin is warm and dry.  Psychiatric: She has a normal mood and affect. Her behavior is normal. Judgment and thought content normal.       Assessment & Plan:   Problem List Items Addressed This Visit      Other   Dysuria - Primary    In office urinalysis positive for leukocytes and negative for nitrites and hematuria. Symptoms and exam are consistent with cystitis. Urine culture sent  for confirmation. Sample of Uribel given. Follow up if symptoms  worsen or do not improve.       Relevant Orders   POCT urinalysis dipstick (Completed)   Urine culture    Other Visit Diagnoses   None.      I have discontinued Ms. Scarpelli's meloxicam and amoxicillin. I am also having her start on sulfamethoxazole-trimethoprim. Additionally, I am having her maintain her Omega 3, cetirizine, Psyllium (METAMUCIL PO), Sennosides (SENOKOT PO), Multiple Minerals-Vitamins (CALCIUM & VIT D3 BONE HEALTH PO), diazepam, levothyroxine, simvastatin, hyoscyamine, Probiotic Product (PROBIOTIC-10 PO), CALCIUM-VITAMIN D PO, and pantoprazole.   Meds ordered this encounter  Medications  . sulfamethoxazole-trimethoprim (BACTRIM DS,SEPTRA DS) 800-160 MG tablet    Sig: Take 1 tablet by mouth 2 (two) times daily.    Dispense:  10 tablet    Refill:  0    Order Specific Question:   Supervising Provider    Answer:   Pricilla Holm A [id number]     Follow-up: Return if symptoms worsen or fail to improve.  Mauricio Po, FNP

## 2016-01-31 NOTE — Patient Instructions (Signed)
Thank you for choosing  [facility]Care.  SUMMARY AND INSTRUCTIONS:  Medication:  Your prescription(s) have been submitted to your pharmacy or been printed and provided for you. Please take as directed and contact our office if you believe you are having problem(s) with the medication(s) or have any questions.  Follow up:  If your symptoms worsen or fail to improve, please contact our office for further instruction, or in case of emergency go directly to the emergency room at the closest medical facility.     Urinary Tract Infection Urinary tract infections (UTIs) can develop anywhere along your urinary tract. Your urinary tract is your body's drainage system for removing wastes and extra water. Your urinary tract includes two kidneys, two ureters, a bladder, and a urethra. Your kidneys are a pair of bean-shaped organs. Each kidney is about the size of your fist. They are located below your ribs, one on each side of your spine. CAUSES Infections are caused by microbes, which are microscopic organisms, including fungi, viruses, and bacteria. These organisms are so small that they can only be seen through a microscope. Bacteria are the microbes that most commonly cause UTIs. SYMPTOMS  Symptoms of UTIs may vary by age and gender of the patient and by the location of the infection. Symptoms in young women typically include a frequent and intense urge to urinate and a painful, burning feeling in the bladder or urethra during urination. Older women and men are more likely to be tired, shaky, and weak and have muscle aches and abdominal pain. A fever may mean the infection is in your kidneys. Other symptoms of a kidney infection include pain in your back or sides below the ribs, nausea, and vomiting. DIAGNOSIS To diagnose a UTI, your caregiver will ask you about your symptoms. Your caregiver will also ask you to provide a urine sample. The urine sample will be tested for bacteria and white blood  cells. White blood cells are made by your body to help fight infection. TREATMENT  Typically, UTIs can be treated with medication. Because most UTIs are caused by a bacterial infection, they usually can be treated with the use of antibiotics. The choice of antibiotic and length of treatment depend on your symptoms and the type of bacteria causing your infection. HOME CARE INSTRUCTIONS  If you were prescribed antibiotics, take them exactly as your caregiver instructs you. Finish the medication even if you feel better after you have only taken some of the medication.  Drink enough water and fluids to keep your urine clear or pale yellow.  Avoid caffeine, tea, and carbonated beverages. They tend to irritate your bladder.  Empty your bladder often. Avoid holding urine for long periods of time.  Empty your bladder before and after sexual intercourse.  After a bowel movement, women should cleanse from front to back. Use each tissue only once. SEEK MEDICAL CARE IF:   You have back pain.  You develop a fever.  Your symptoms do not begin to resolve within 3 days. SEEK IMMEDIATE MEDICAL CARE IF:   You have severe back pain or lower abdominal pain.  You develop chills.  You have nausea or vomiting.  You have continued burning or discomfort with urination. MAKE SURE YOU:   Understand these instructions.  Will watch your condition.  Will get help right away if you are not doing well or get worse.   This information is not intended to replace advice given to you by your health care provider. Make sure you   discuss any questions you have with your health care provider.   Document Released: 02/11/2005 Document Revised: 01/23/2015 Document Reviewed: 06/12/2011 Elsevier Interactive Patient Education [employer].

## 2016-02-03 ENCOUNTER — Telehealth: Payer: Self-pay | Admitting: Family

## 2016-02-03 LAB — URINE CULTURE

## 2016-02-03 MED ORDER — AMOXICILLIN-POT CLAVULANATE 875-125 MG PO TABS: 1.0000 | ORAL_TABLET | Freq: Two times a day (BID) | ORAL | 0 refills | Status: DC

## 2016-02-03 MED ORDER — FLUCONAZOLE 150 MG PO TABS: 150.0000 mg | ORAL_TABLET | Freq: Once | ORAL | 0 refills | Status: AC

## 2016-02-03 NOTE — Telephone Encounter (Signed)
Pt states that she is having so much burning an irritation on the outside of her vagina. She is wondering if you have any advise on what to use to help it.

## 2016-02-03 NOTE — Telephone Encounter (Signed)
Pt called stating Marilyn Blake gave abx for uti. She has been on it for 5 days and burning sensation still there. Is this normal,what should she do? Please advise.

## 2016-02-03 NOTE — Telephone Encounter (Signed)
I just received the urine culture and shows there was resistance to the bactrim. Therefore I have changed her antibiotic to augmentin.

## 2016-02-03 NOTE — Telephone Encounter (Signed)
She can use AZO and fluconazole has been sent to her pharmacy.

## 2016-02-04 NOTE — Telephone Encounter (Signed)
Pt aware.

## 2016-02-06 ENCOUNTER — Telehealth: Payer: Self-pay | Admitting: Internal Medicine

## 2016-02-06 NOTE — Telephone Encounter (Signed)
She should finish antibiotics before she would need to see urology (she is only 2-3 days into a 7 day course). Once they are done if she is still having burning she should come back for another urine sample.

## 2016-02-06 NOTE — Telephone Encounter (Signed)
Patient will finish the round of antibiotics and will call us if she is still having symptoms when the course is done.

## 2016-02-06 NOTE — Telephone Encounter (Signed)
Pt called in and said she is still burning for this uti.  She is wanting to know if she could be referred to Urology ASAP?

## 2016-02-10 ENCOUNTER — Telehealth: Payer: Self-pay | Admitting: Internal Medicine

## 2016-02-10 NOTE — Telephone Encounter (Signed)
Certainly - does she want an Rx or does she want to try OTC?

## 2016-02-10 NOTE — Telephone Encounter (Signed)
Patient stopped protonix, bu there symptoms have returned. She would like to try prevacid.  OK with you?

## 2016-02-11 ENCOUNTER — Telehealth: Payer: Self-pay

## 2016-02-11 ENCOUNTER — Ambulatory Visit (INDEPENDENT_AMBULATORY_CARE_PROVIDER_SITE_OTHER): Payer: Medicare Other | Admitting: Internal Medicine

## 2016-02-11 ENCOUNTER — Other Ambulatory Visit: Payer: Self-pay

## 2016-02-11 ENCOUNTER — Telehealth: Payer: Self-pay | Admitting: Internal Medicine

## 2016-02-11 ENCOUNTER — Encounter: Payer: Self-pay | Admitting: Internal Medicine

## 2016-02-11 DIAGNOSIS — J479 Bronchiectasis, uncomplicated: Secondary | ICD-10-CM | POA: Diagnosis not present

## 2016-02-11 DIAGNOSIS — Z23 Encounter for immunization: Secondary | ICD-10-CM

## 2016-02-11 DIAGNOSIS — R042 Hemoptysis: Secondary | ICD-10-CM

## 2016-02-11 MED ORDER — OMEPRAZOLE 40 MG PO CPDR: 40.0000 mg | DELAYED_RELEASE_CAPSULE | Freq: Every day | ORAL | 3 refills | Status: DC

## 2016-02-11 MED ORDER — LANSOPRAZOLE 30 MG PO CPDR: 30.0000 mg | DELAYED_RELEASE_CAPSULE | Freq: Every day | ORAL | 3 refills | Status: DC

## 2016-02-11 NOTE — Assessment & Plan Note (Signed)
No hemoptysis identified in a long time. If she has a new exacerbation of her chronic bronchiectatic/bronchitic scarring then she may see this develop again. Education done.

## 2016-02-11 NOTE — Telephone Encounter (Signed)
Patient notified and she would like rx for prevacid.  Rx sent

## 2016-02-11 NOTE — Telephone Encounter (Signed)
Patient wants to know if she can switch to Prilosec since her lansoprazole is $148.00 for 3 months worth, please advise, thank you.

## 2016-02-11 NOTE — Progress Notes (Deleted)
Patient ID: Marilyn Blake, female    DOB: Jun 30, 1940, 75 y.o.   MRN: [id number]  HPI  female never smoker followed for bronchiectasis, history of MAIC bronchitis, allergic rhinitis.   01/31/15-  75 year old female never smoker followed for bronchiectasis, recurrent MAIC bronchitis, allergic rhinitis, insomnia, complicated by hypothyroid, IBS Follows For: pt states shes been having headaches with runny nose and dry cough. pt has been using flonase. pt states shes been wheezing at times no c/o SOB.  Originally + MAIC by [employer] 2010 and treated 14 months with cipro, zith. Sputum + again 2014.  Intermittent pains behind right ear and frontal headache. Zyrtec and Flonase keep rhinitis controlled. Saw ENT 3 months ago for the headache Occasional cough, clear mucus with no blood. Little shortness of breath. She doubts need for an inhaler. Denies night sweats and fever.  08/26/2015-75 year old female never smoker followed for bronchiectasis, recurrent MAIC bronchitis, allergic rhinitis, insomnia, complicated by hypothyroid, IBS ACUTE VISIT: Pt states she has noticed increased SOB for the 4-5 days;felt like her heart was skipping a beat at the same time; no energy as well. Pt took ASA 81mg  and Valium and it settled down. Pt noted this happened in the past when her Thyroid levels were "off" and her medication had been changed.  02/11/2016-75 year old female never smoker followed for bronchiectasis, recurrent MAIC bronchitis, allergic rhinitis, insomnia, complicated by hypothyroid, IBS FOLLOWS FOR: Pt states she continues to have wheezing at times but otherwise doing well; has recently been on several abx's for bladder infection. Completed yesterday. She had taken Bactrim and then Augmentin for UTI. Still has some productive cough with white/yellow sputum but no recent exacerbation. No wheezing and no chest inhaler use. Does continue Flonase when needed. Up-to-date on pneumonia vaccine. She asks that we  remove Avelox from her medication and allergy list. CXR 09/15/2016IMPRESSION: 1. No acute cardiopulmonary disease. 2. Stable areas of lung scarring. No change from the prior chest radiograph. Electronically Signed   By: Lajean Manes M.D.   On: 01/31/2015 10:49   Review of Systems- see HPI Constitutional:   No-   weight loss, +night sweats, , chills, +fatigue, lassitude. HEENT:   +  headaches, difficulty swallowing, tooth/dental problems, sore throat,       No-  sneezing, itching, ear ache, +nasal congestion, post nasal drip,  CV:  +atypicalchest pain, orthopnea, PND, swelling in lower extremities, anasarca,dizziness, palpitations Resp: + shortness of breath with exertion or at rest.             + productive cough,   non-productive cough,  no- coughing up of blood.              No- change in color of mucus.  No- wheezing.   Skin: No-   rash or lesions. GI:  No-   heartburn, indigestion, abdominal pain, nausea, vomiting,  GU:  MS:  No-   joint pain or swelling. . Neuro- grossly normal to observation,   Psych:  No- change in mood or affect. No depression or anxiety.  No memory loss.  Objective:   Physical Exam General- Alert, Oriented, Affect-appropriate, Distress- none acute, petite lady Skin- rash-none, lesions- none, excoriation- none Lymphadenopathy- none Head- atraumatic            Eyes- Gross vision intact, PERRLA, conjunctivae clear secretions            Ears- Hearing, canals- normal            Nose- Clear, No-Septal dev, mucus,  polyps, erosion, perforation             Throat- Mallampati II , mucosa clear- not red , drainage- none, tonsils- atrophic, Neck- flexible , trachea midline, no stridor , thyroid nl, carotid no bruit Chest - symmetrical excursion , unlabored           Heart/CV- RRR , no murmur , no gallop  , no rub, nl s1 s2                           - JVD- none , edema- none, stasis changes- none, varices- none           Lung- clear, unlabored, wheeze- none,  cough- none , dullness-none, rub-none           Chest wall- Not tender pressure L parasternal joints, Abd- Br/ Gen/ Rectal- Not done, not indicated Extrem- cyanosis- none, clubbing, none, atrophy- none, strength- nl Neuro- grossly intact to observation

## 2016-02-11 NOTE — Assessment & Plan Note (Signed)
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 Plan-flu shot

## 2016-02-11 NOTE — Telephone Encounter (Signed)
New rx sent

## 2016-02-11 NOTE — Patient Instructions (Addendum)
Flu vax  Please call if we can help  We will take avelox off your allergy list, since apparently you didn't really react to it.

## 2016-02-11 NOTE — Progress Notes (Signed)
Patient ID: Marilyn Blake, female    DOB: [birth year], 75 y.o.   MRN: [id number]  HPI 02/03/2011-75 year old female never smoker followed for bronchiectasis, history of MAIC bronchitis, allergic rhinitis.  01/31/15-  75 year old female never smoker followed for bronchiectasis, recurrent MAIC bronchitis, allergic rhinitis, insomnia, complicated by hypothyroid, IBS Follows For: pt states shes been having headaches with runny nose and dry cough. pt has been using flonase. pt states shes been wheezing at times no c/o SOB.  Originally + MAIC by [employer] 2010 and treated 14 months with cipro, zith. Sputum + again 2014.  Intermittent pains behind right ear and frontal headache. Zyrtec and Flonase keep rhinitis controlled. Saw ENT 3 months ago for the headache Occasional cough, clear mucus with no blood. Little shortness of breath. She doubts need for an inhaler. Denies night sweats and fever.  08/26/2015-75 year old female never smoker followed for bronchiectasis, recurrent MAIC bronchitis, allergic rhinitis, insomnia, complicated by hypothyroid, IBS ACUTE VISIT: Pt states she has noticed increased SOB for the 4-5 days;felt like her heart was skipping a beat at the same time; no energy as well. Pt took ASA 81mg  and Valium and it settled down. Pt noted this happened in the past when her Thyroid levels were "off" and her medication had been changed.  02/11/2016-75 year old female never smoker followed for bronchiectasis, recurrent MAIC bronchitis, allergic rhinitis, insomnia, complicated by hypothyroid, IBS FOLLOWS FOR: Pt states she continues to have wheezing at times but otherwise doing well; has recently been on several abx's for bladder infection. Completed yesterday. No recent lung exacerbation. Occasionally cough is productive of white or yellow with no blood. No wheeze and no inhaler. Up-to-date on pneumonia vaccine. History of the small local rash on her arm after taking Avelox. She says the doctor  then thought she just had an insect bite and not an allergic reaction to Avelox, so she asked me to correct the allergy list in her chart.  Review of Systems- see HPI Constitutional:   No-   weight loss, +night sweats, , chills, +fatigue, lassitude. HEENT:   +  headaches, difficulty swallowing, tooth/dental problems, sore throat,       No-  sneezing, itching, ear ache, +nasal congestion, post nasal drip,  CV:  atypicalchest pain, orthopnea, PND, swelling in lower extremities, anasarca,dizziness, palpitations Resp: + shortness of breath with exertion or at rest.             + productive cough,   non-productive cough,  no- coughing up of blood.              No- change in color of mucus.  No- wheezing.   Skin: No-   rash or lesions. GI:  No-   heartburn, indigestion, abdominal pain, nausea, vomiting,  GU:  MS:  No-   joint pain or swelling. . Neuro- grossly normal to observation,   Psych:  No- change in mood or affect. No depression or anxiety.  No memory loss.  Objective:   Physical Exam General- Alert, Oriented, Affect-appropriate, Distress- none acute, petite lady Skin- rash-none, lesions- none, excoriation- none Lymphadenopathy- none Head- atraumatic            Eyes- Gross vision intact, PERRLA, conjunctivae clear secretions            Ears- Hearing, canals- normal            Nose- Clear, No-Septal dev, mucus, polyps, erosion, perforation             Throat- Mallampati II ,  mucosa clear- not red , drainage- none, tonsils- atrophic, Neck- flexible , trachea midline, no stridor , thyroid nl, carotid no bruit Chest - symmetrical excursion , unlabored           Heart/CV- RRR , no murmur , no gallop  , no rub, nl s1 s2                           - JVD- none , edema- none, stasis changes- none, varices- none           Lung- + Few crackles, unlabored, wheeze- none, cough- none , dullness-none, rub-none           Chest wall- , Abd- Br/ Gen/ Rectal- Not done, not indicated Extrem- cyanosis-  none, clubbing, none, atrophy- none, strength- nl Neuro- grossly intact to observation

## 2016-02-12 MED ORDER — OMEPRAZOLE 20 MG PO CPDR: 20.0000 mg | DELAYED_RELEASE_CAPSULE | Freq: Every day | ORAL | 3 refills | Status: DC

## 2016-02-12 NOTE — Telephone Encounter (Signed)
Left patient voice mail message on her cell # that we sent in new rx and to call us if this dose doesn't help.

## 2016-02-12 NOTE — Telephone Encounter (Signed)
Sure - omeprazole 20 mg daily before breakfast # 90 3 RF Starting with 20 if that is not effective will increase dose

## 2016-02-28 ENCOUNTER — Other Ambulatory Visit: Payer: Medicare Other

## 2016-02-28 ENCOUNTER — Ambulatory Visit (INDEPENDENT_AMBULATORY_CARE_PROVIDER_SITE_OTHER): Payer: Medicare Other | Admitting: Internal Medicine

## 2016-02-28 ENCOUNTER — Other Ambulatory Visit: Payer: Self-pay | Admitting: Geriatric Medicine

## 2016-02-28 ENCOUNTER — Telehealth: Payer: Self-pay | Admitting: Internal Medicine

## 2016-02-28 ENCOUNTER — Encounter: Payer: Self-pay | Admitting: Internal Medicine

## 2016-02-28 VITALS — BP 120/70 | HR 84 | Temp 97.8°F | Resp 12 | Ht 61.0 in | Wt 114.8 lb

## 2016-02-28 DIAGNOSIS — R300 Dysuria: Secondary | ICD-10-CM

## 2016-02-28 LAB — POCT URINALYSIS DIPSTICK
BILIRUBIN UA: NEGATIVE
GLUCOSE UA: NEGATIVE
KETONES UA: NEGATIVE
Leukocytes, UA: NEGATIVE
Nitrite, UA: NEGATIVE
Protein, UA: NEGATIVE
RBC UA: NEGATIVE
SPEC GRAV UA: 1.02
UROBILINOGEN UA: NEGATIVE
pH, UA: 6

## 2016-02-28 MED ORDER — DIAZEPAM 5 MG PO TABS: ORAL_TABLET | ORAL | 2 refills | Status: DC

## 2016-02-28 MED ORDER — FLUCONAZOLE 150 MG PO TABS: 150.0000 mg | ORAL_TABLET | ORAL | 0 refills | Status: DC

## 2016-02-28 MED ORDER — TRIAMCINOLONE ACETONIDE 0.1 % EX CREA: 1.0000 "application " | TOPICAL_CREAM | Freq: Two times a day (BID) | CUTANEOUS | 0 refills | Status: DC

## 2016-02-28 MED ORDER — CLOTRIMAZOLE-BETAMETHASONE 1-0.05 % EX CREA: 1.0000 "application " | TOPICAL_CREAM | Freq: Two times a day (BID) | CUTANEOUS | 0 refills | Status: DC

## 2016-02-28 NOTE — Patient Instructions (Signed)
We have sent in triamcinolone cream for the spot on the back. You can use it twice daily until it is gone.    We have sent in the clotrimazole/betamethasone cream for the vaginal area that you can use daily for no more than 2 weeks.   We have sent in diflucan that you will take 1 pill today, then 1 pill on Monday then 1 pill next Thursday.

## 2016-02-28 NOTE — Telephone Encounter (Signed)
Patient called to advise that the pharmacy did not receive clotrimazole-betamethasone (LOTRISONE) cream [id number] . Please follow up with pharmacy to make sure they have it

## 2016-02-28 NOTE — Progress Notes (Signed)
   Subjective:    Patient ID: Marilyn Blake, female    DOB: 11/23/1940, 75 y.o.   MRN: [id number]  HPI The patient is a 75 YO female coming in for frequency of urination as well as burning in her labia. She had a UTI in September and since then has been having this problem. It was resistant to the first antibiotic. She then was given diflucan for possible yeast infection with her continued symptoms. This may have helped some. She has been itching some due to the itching which causes it to burn. No fevers or chills. No new sexual partner.   Review of Systems  Constitutional: Negative.   Respiratory: Negative.   Cardiovascular: Negative.   Gastrointestinal: Negative.   Genitourinary: Positive for dysuria and vaginal pain. Negative for decreased urine volume, difficulty urinating, dyspareunia, enuresis, flank pain, frequency, genital sores, hematuria, pelvic pain, urgency and vaginal discharge.  Neurological: Negative.       Objective:   Physical Exam  Constitutional: She is oriented to person, place, and time. She appears well-developed and well-nourished.  HENT:  Head: Normocephalic and atraumatic.  Eyes: EOM are normal.  Cardiovascular: Normal rate and regular rhythm.   Pulmonary/Chest: Effort normal.  Abdominal: Soft. Bowel sounds are normal. She exhibits no distension. There is no tenderness. There is no rebound.  Genitourinary:  Genitourinary Comments: Some white discharge in the vaginal canal, redness on the labia major with some stigmata of scratching. No LAD inguinal  Neurological: She is alert and oriented to person, place, and time.  Skin: Skin is warm and dry.   Vitals:   02/28/16 1059  BP: 120/70  Pulse: 84  Resp: 12  Temp: 97.8 F (36.6 C)  TempSrc: Oral  SpO2: 97%  Weight: 114 lb 12.8 oz (52.1 kg)  Height: 5\' 1"  (1.549 m)      Assessment & Plan:

## 2016-02-28 NOTE — Telephone Encounter (Signed)
Re-sent to pharmacy.

## 2016-02-28 NOTE — Progress Notes (Signed)
Pre visit review using our clinic review tool, if applicable. No additional management support is needed unless otherwise documented below in the visit note. 

## 2016-02-28 NOTE — Assessment & Plan Note (Signed)
U/A without signs of infection, urine culture sent to be sure no infection. Rx for diflucan for likely yeast infection. Also rx for clotrimazole for the redness of the labia.

## 2016-02-28 NOTE — Addendum Note (Signed)
Addended by: Pricilla Holm A on: 02/28/2016 02:58 PM   Modules accepted: Orders

## 2016-03-01 LAB — CULTURE, URINE COMPREHENSIVE: Organism ID, Bacteria: NO GROWTH

## 2016-03-02 ENCOUNTER — Telehealth: Payer: Self-pay | Admitting: Emergency Medicine

## 2016-03-02 NOTE — Telephone Encounter (Signed)
Pts husband called and states is wife is still having back pain and wants to know if she can be referred over to a neurologist. Please advise thanks.

## 2016-03-02 NOTE — Telephone Encounter (Signed)
We have not discussed her back pain, would she like to make visit to discuss treatment options? Generally neurology does not address back pain. Sometimes neuro surgery or orthopedics would address back pain.

## 2016-03-03 ENCOUNTER — Encounter: Payer: Medicare Other | Admitting: Internal Medicine

## 2016-03-04 NOTE — Telephone Encounter (Signed)
Patient says she is feeling better. She will schedule an office visit later if she needs one.

## 2016-04-22 ENCOUNTER — Telehealth: Payer: Self-pay | Admitting: Internal Medicine

## 2016-04-22 NOTE — Telephone Encounter (Signed)
Patient called office regarding annual wellness visit, but phone was disconnected. Called patient back to schedule appt, but patient did not answer. Left vm for pt to call office back.

## 2016-05-04 NOTE — Telephone Encounter (Signed)
Spoke with patient. Awv was scheduled for Jan 2018.

## 2016-06-04 ENCOUNTER — Ambulatory Visit: Payer: Medicare Other

## 2016-06-15 NOTE — Progress Notes (Signed)
Pre visit review using our clinic review tool, if applicable. No additional management support is needed unless otherwise documented below in the visit note. 

## 2016-06-15 NOTE — Progress Notes (Addendum)
Subjective:   Marilyn Blake is a 76 y.o. female who presents for Medicare Annual (Subsequent) preventive examination.  Review of Systems:  No ROS.  Medicare Wellness Visit.  Cardiac Risk Factors include: hypertension;advanced age (>64mn, >>34women);family history of premature cardiovascular disease;dyslipidemia Sleep patterns: 8 hours, gets up to go to bathroom x2 but able to go back to sleep Home Safety/Smoke Alarms: Fire alarm in place, has security alarm.    Living environment; residence and Firearm Safety: Lives with husband. 2 story home. Steps with  Rail, no issues climbing. Firearms are locked away Seat Belt Safety/Bike Helmet: Wears seat belt.   Counseling:   Eye Exam- last 9/17 Dr. MSabra Heck Patient goes yearly Dental- last cleaning in summer. Goes every 6 months. Dr. JLenard Simmer Female:   Pap-N/A      Mammo-06/20/2015, negative. Made referral     Dexa scan-07/19/2013, Osteopenia. Made referral     CCS-colonoscopy 09/16/2010, normal. Recall 10 years.       Objective:     Vitals: BP 112/60 (BP Location: Left Arm, Patient Position: Sitting, Cuff Size: Normal)   Pulse 76   Wt 113 lb (51.3 kg)   LMP 08/10/2013 Comment: post menopausal  SpO2 95%   BMI 21.35 kg/m   Body mass index is 21.35 kg/m.   Tobacco History  Smoking Status  . Never Smoker  Smokeless Tobacco  . Never Used    Comment: Married, Housewife     Counseling given: Not Answered   Past Medical History:  Diagnosis Date  . ALLERGIC RHINITIS   . ANXIETY   . Arthritis   . BACTEREMIA, MYCOBACTERIUM AVIUM COMPLEX   . BRONCHIECTASIS   . COLONIC POLYPS, HX OF   . Fundic gland polyps of stomach, benign   . GERD   . GOUT   . Headache   . HYPERLIPIDEMIA   . HYPOTHYROIDISM   . Irritable bowel syndrome   . OSTEOPENIA    Past Surgical History:  Procedure Laterality Date  . ABDOMINAL HYSTERECTOMY    . BLADDER SUSPENSION    . CARPAL TUNNEL RELEASE Left   . COLONOSCOPY    . MASS EXCISION  Right 08/10/2013   Procedure: DEBRIDE DISTAL INTERPHALANGEAL JOINT/EXCISION MUCOID CYST RIGHT LONG FINGER and right small finger;  Surgeon: RCammie Sickle, MD;  [facility]  Service: Orthopedics;  Laterality: Right;  . THYROIDECTOMY     Hurthele cell tumor  . UPPER GASTROINTESTINAL ENDOSCOPY     Family History  Problem Relation Age of Onset  . Cervical cancer Mother 831    died age 77 . Heart attack Father 869 . Colon cancer Neg Hx   . Esophageal cancer Neg Hx   . Stomach cancer Neg Hx   . Rectal cancer Neg Hx    History  Sexual Activity  . Sexual activity: Not on file    Outpatient Encounter Prescriptions as of 06/16/2016  Medication Sig  . CALCIUM-VITAMIN D PO Take by mouth 1 day or 1 dose.  . cetirizine (ZYRTEC) 10 MG tablet Take 10 mg by mouth daily.    . diazepam (VALIUM) 5 MG tablet TAKE ONE TABLET BY MOUTH EVERY 12 HOURS AS NEEDED FOR ANXIETY  . diphenhydramine-acetaminophen (TYLENOL PM) 25-500 MG TABS tablet Take 0.5 tablets by mouth at bedtime as needed.  . hyoscyamine (LEVSIN SL) 0.125 MG SL tablet Place 1 tablet (0.125 mg total) under the tongue every 4 (four) hours as needed.  .Marland Kitchenlevothyroxine (SYNTHROID,  LEVOTHROID) 50 MCG tablet Take 50 mcg by mouth every other day.   . levothyroxine (SYNTHROID, LEVOTHROID) 75 MCG tablet TAKE ONE TABLET BY MOUTH EVERY OTHER DAY- ALTERNATE WITH 50MCG.  . Multiple Minerals-Vitamins (CALCIUM & VIT D3 BONE HEALTH PO) Take by mouth. Reported on 09/09/2015  . OMEGA 3 1000 MG CAPS Take by mouth daily.    Marland Kitchen omeprazole (PRILOSEC) 20 MG capsule Take 1 capsule (20 mg total) by mouth daily.  . Probiotic Product (PROBIOTIC-10 PO) Take by mouth 1 day or 1 dose.  . Psyllium (METAMUCIL PO) Take by mouth every other day.  . Sennosides (SENOKOT PO) Take by mouth.  . simvastatin (ZOCOR) 20 MG tablet Take 1 tablet (20 mg total) by mouth at bedtime.  . clotrimazole-betamethasone (LOTRISONE) cream Apply 1 application topically  2 (two) times daily. (Patient not taking: Reported on 06/16/2016)  . fluconazole (DIFLUCAN) 150 MG tablet Take 1 tablet (150 mg total) by mouth every 3 (three) days. (Patient not taking: Reported on 06/16/2016)  . triamcinolone cream (KENALOG) 0.1 % Apply 1 application topically 2 (two) times daily. (Patient not taking: Reported on 06/16/2016)  . [DISCONTINUED] omeprazole (PRILOSEC) 40 MG capsule Take 1 capsule (40 mg total) by mouth daily. (Patient not taking: Reported on 02/28/2016)   No facility-administered encounter medications on file as of 06/16/2016.     Activities of Daily Living In your present state of health, do you have any difficulty performing the following activities: 06/16/2016  Hearing? N  Vision? N  Difficulty concentrating or making decisions? N  Walking or climbing stairs? N  Dressing or bathing? N  Doing errands, shopping? N  Preparing Food and eating ? N  Using the Toilet? N  In the past six months, have you accidently leaked urine? Y  Do you have problems with loss of bowel control? N  Managing your Medications? N  Managing your Finances? N  Housekeeping or managing your Housekeeping? N  Some recent data might be hidden    Patient Care Team: Hoyt Koch, MD as PCP - General (Internal Medicine) Deneise Lever, MD (Pulmonary Disease) Gatha Mayer, MD (Gastroenterology) Lenard Simmer (Dentistry)    Assessment:    Physical assessment deferred to PCP.  Exercise Activities and Dietary recommendations Current Exercise Habits: Home exercise routine, Type of exercise: treadmill;walking (stationary bike), Time (Minutes): 30, Frequency (Times/Week): 7, Weekly Exercise (Minutes/Week): 210, Exercise limited by: None identified Diet (meal preparation, eat out, water intake, caffeinated beverages, dairy products, fruits and vegetables): mainly eats at home. Has 3 meals per day. Drinks a lot of water  Breakfast: oatmeal with fruit  Lunch: sandwich  Dinner:   Broil fish, fruits and vegetables   Encouraged to continue to eat healthy and remain active Goals    . maintain current health (pt-stated)      Fall Risk Fall Risk  06/16/2016 02/28/2016 05/21/2015 02/27/2015 02/12/2015  Falls in the past year? No No No Yes No  Number falls in past yr: - - - 1 -  Injury with Fall? - - - Yes -  Risk Factor Category  - - - High Fall Risk -  Risk for fall due to : - - - (No Data) -  Risk for fall due to (comments): - - - floor was slippery -   Depression Screen PHQ 2/9 Scores 06/16/2016 02/28/2016 02/27/2015 02/12/2015  PHQ - 2 Score 0 0 0 1     Cognitive Function       Ad8 score reviewed  for issues:  Issues making decisions: no  Less interest in hobbies / activities: no  Repeats questions, stories (family complaining): no  Trouble using ordinary gadgets (microwave, computer, phone): no  Forgets the month or year: no  Mismanaging finances: no  Remembering appts: no  Daily problems with thinking and/or memory: no Ad8 score is= 0     Immunization History  Administered Date(s) Administered  . Influenza Split 02/03/2011, 02/15/2012  . Influenza Whole 02/16/2008, 03/25/2009, 02/03/2010  . Influenza, High Dose Seasonal PF 02/11/2016  . Influenza,inj,Quad PF,36+ Mos 01/18/2013, 03/15/2014, 01/31/2015  . Influenza-Unspecified 01/31/2015  . Pneumococcal Conjugate-13 03/27/2014  . Pneumococcal Polysaccharide-23 08/14/2008  . Td 03/27/2009   Screening Tests Health Maintenance  Topic Date Due  . ZOSTAVAX  06/24/1999  . TETANUS/TDAP  03/28/2019  . INFLUENZA VACCINE  Completed  . DEXA SCAN  Completed  . PNA vac Low Risk Adult  Completed      Plan:     Continue to eat heart healthy diet (full of fruits, vegetables, whole grains, lean protein, water--limit salt, fat, and sugar intake) and increase physical activity as tolerated.  Continue doing brain stimulating activities (puzzles, reading, adult coloring books, staying active) to keep  memory sharp.   Bring a copy of your advance directives to your next office visit.  Schedule mammogram and bone scan  **FYI: Patient reports soreness in right nostril with occasional scant bleeding. Advised to call for an appointment if symptoms persist, declined appointment at present time.   During the course of the visit the patient was educated and counseled about the following appropriate screening and preventive services:   Vaccines to include Pneumoccal, Influenza, Hepatitis B, Td, Zostavax, HCV  Cardiovascular Disease  Colorectal cancer screening  Bone density screening  Diabetes screening  Glaucoma screening  Mammography/PAP  Nutrition counseling   Patient Instructions (the written plan) was given to the patient.   Michiel Cowboy, RN  06/16/2016

## 2016-06-16 ENCOUNTER — Ambulatory Visit (INDEPENDENT_AMBULATORY_CARE_PROVIDER_SITE_OTHER): Payer: Medicare Other | Admitting: *Deleted

## 2016-06-16 VITALS — BP 112/60 | HR 76 | Ht 61.0 in | Wt 113.0 lb

## 2016-06-16 DIAGNOSIS — E2839 Other primary ovarian failure: Secondary | ICD-10-CM | POA: Diagnosis not present

## 2016-06-16 DIAGNOSIS — Z1239 Encounter for other screening for malignant neoplasm of breast: Secondary | ICD-10-CM

## 2016-06-16 DIAGNOSIS — Z0000 Encounter for general adult medical examination without abnormal findings: Secondary | ICD-10-CM

## 2016-06-16 DIAGNOSIS — Z1231 Encounter for screening mammogram for malignant neoplasm of breast: Secondary | ICD-10-CM

## 2016-06-16 NOTE — Progress Notes (Signed)
Medical screening examination/treatment/procedure(s) were performed by non-physician practitioner and as supervising physician I was immediately available for consultation/collaboration. I agree with above. Jayden Rudge A Carol Loftin, MD 

## 2016-06-16 NOTE — Patient Instructions (Addendum)
Continue to eat heart healthy diet (full of fruits, vegetables, whole grains, lean protein, water--limit salt, fat, and sugar intake) and increase physical activity as tolerated.  Continue doing brain stimulating activities (puzzles, reading, adult coloring books, staying active) to keep memory sharp.   Bring a copy of your advance directives to your next office visit.  Schedule mammogram and bone scan  Fall Prevention in the Home Introduction Falls can cause injuries. They can happen to people of all ages. There are many things you can do to make your home safe and to help prevent falls. What can I do on the outside of my home?  Regularly fix the edges of walkways and driveways and fix any cracks.  Remove anything that might make you trip as you walk through a door, such as a raised step or threshold.  Trim any bushes or trees on the path to your home.  Use bright outdoor lighting.  Clear any walking paths of anything that might make someone trip, such as rocks or tools.  Regularly check to see if handrails are loose or broken. Make sure that both sides of any steps have handrails.  Any raised decks and porches should have guardrails on the edges.  Have any leaves, snow, or ice cleared regularly.  Use sand or salt on walking paths during winter.  Clean up any spills in your garage right away. This includes oil or grease spills. What can I do in the bathroom?  Use night lights.  Install grab bars by the toilet and in the tub and shower. Do not use towel bars as grab bars.  Use non-skid mats or decals in the tub or shower.  If you need to sit down in the shower, use a plastic, non-slip stool.  Keep the floor dry. Clean up any water that spills on the floor as soon as it happens.  Remove soap buildup in the tub or shower regularly.  Attach bath mats securely with double-sided non-slip rug tape.  Do not have throw rugs and other things on the floor that can make you  trip. What can I do in the bedroom?  Use night lights.  Make sure that you have a light by your bed that is easy to reach.  Do not use any sheets or blankets that are too big for your bed. They should not hang down onto the floor.  Have a firm chair that has side arms. You can use this for support while you get dressed.  Do not have throw rugs and other things on the floor that can make you trip. What can I do in the kitchen?  Clean up any spills right away.  Avoid walking on wet floors.  Keep items that you use a lot in easy-to-reach places.  If you need to reach something above you, use a strong step stool that has a grab bar.  Keep electrical cords out of the way.  Do not use floor polish or wax that makes floors slippery. If you must use wax, use non-skid floor wax.  Do not have throw rugs and other things on the floor that can make you trip. What can I do with my stairs?  Do not leave any items on the stairs.  Make sure that there are handrails on both sides of the stairs and use them. Fix handrails that are broken or loose. Make sure that handrails are as long as the stairways.  Check any carpeting to make sure that it   it is firmly attached to the stairs. Fix any carpet that is loose or worn.  Avoid having throw rugs at the top or bottom of the stairs. If you do have throw rugs, attach them to the floor with carpet tape.  Make sure that you have a light switch at the top of the stairs and the bottom of the stairs. If you do not have them, ask someone to add them for you. What else can I do to help prevent falls?  Wear shoes that:  Do not have high heels.  Have rubber bottoms.  Are comfortable and fit you well.  Are closed at the toe. Do not wear sandals.  If you use a stepladder:  Make sure that it is fully opened. Do not climb a closed stepladder.  Make sure that both sides of the stepladder are locked into place.  Ask someone to hold it for you, if  possible.  Clearly mark and make sure that you can see:  Any grab bars or handrails.  First and last steps.  Where the edge of each step is.  Use tools that help you move around (mobility aids) if they are needed. These include:  Canes.  Walkers.  Scooters.  Crutches.  Turn on the lights when you go into a dark area. Replace any light bulbs as soon as they burn out.  Set up your furniture so you have a clear path. Avoid moving your furniture around.  If any of your floors are uneven, fix them.  If there are any pets around you, be aware of where they are.  Review your medicines with your doctor. Some medicines can make you feel dizzy. This can increase your chance of falling. Ask your doctor what other things that you can do to help prevent falls. This information is not intended to replace advice given to you by your health care provider. Make sure you discuss any questions you have with your health care provider. Document Released: 02/28/2009 Document Revised: 10/10/2015 Document Reviewed: 06/08/2014  2017 [employer]  Health Maintenance, Female Introduction Adopting a healthy lifestyle and getting preventive care can go a long way to promote health and wellness. Talk with your health care provider about what schedule of regular examinations is right for you. This is a good chance for you to check in with your provider about disease prevention and staying healthy. In between checkups, there are plenty of things you can do on your own. Experts have done a lot of research about which lifestyle changes and preventive measures are most likely to keep you healthy. Ask your health care provider for more information. Weight and diet Eat a healthy diet  Be sure to include plenty of vegetables, fruits, low-fat dairy products, and lean protein.  Do not eat a lot of foods high in solid fats, added sugars, or salt.  Get regular exercise. This is one of the most important things you  can do for your health.  Most adults should exercise for at least 150 minutes each week. The exercise should increase your heart rate and make you sweat (moderate-intensity exercise).  Most adults should also do strengthening exercises at least twice a week. This is in addition to the moderate-intensity exercise. Maintain a healthy weight  Body mass index (BMI) is a measurement that can be used to identify possible weight problems. It estimates body fat based on height and weight. Your health care provider can help determine your BMI and help you achieve or maintain  a healthy weight.  For females 33 years of age and older:  A BMI below 18.5 is considered underweight.  A BMI of 18.5 to 24.9 is normal.  A BMI of 25 to 29.9 is considered overweight.  A BMI of 30 and above is considered obese. Watch levels of cholesterol and blood lipids  You should start having your blood tested for lipids and cholesterol at 77 years of age, then have this test every 5 years.  You may need to have your cholesterol levels checked more often if:  Your lipid or cholesterol levels are high.  You are older than 77 years of age.  You are at high risk for heart disease. Cancer screening Lung Cancer  Lung cancer screening is recommended for adults 38-76 years old who are at high risk for lung cancer because of a history of smoking.  A yearly low-dose CT scan of the lungs is recommended for people who:  Currently smoke.  Have quit within the past 15 years.  Have at least a 30-pack-year history of smoking. A pack year is smoking an average of one pack of cigarettes a day for 1 year.  Yearly screening should continue until it has been 15 years since you quit.  Yearly screening should stop if you develop a health problem that would prevent you from having lung cancer treatment. Breast Cancer  Practice breast self-awareness. This means understanding how your breasts normally appear and feel.  It also  means doing regular breast self-exams. Let your health care provider know about any changes, no matter how small.  If you are in your 20s or 30s, you should have a clinical breast exam (CBE) by a health care provider every 1-3 years as part of a regular health exam.  If you are 40 or older, have a CBE every year. Also consider having a breast X-ray (mammogram) every year.  If you have a family history of breast cancer, talk to your health care provider about genetic screening.  If you are at high risk for breast cancer, talk to your health care provider about having an MRI and a mammogram every year.  Breast cancer gene (BRCA) assessment is recommended for women who have family members with BRCA-related cancers. BRCA-related cancers include:  Breast.  Ovarian.  Tubal.  Peritoneal cancers.  Results of the assessment will determine the need for genetic counseling and BRCA1 and BRCA2 testing. Cervical Cancer  Your health care provider may recommend that you be screened regularly for cancer of the pelvic organs (ovaries, uterus, and vagina). This screening involves a pelvic examination, including checking for microscopic changes to the surface of your cervix (Pap test). You may be encouraged to have this screening done every 3 years, beginning at age 80.  For women ages 75-65, health care providers may recommend pelvic exams and Pap testing every 3 years, or they may recommend the Pap and pelvic exam, combined with testing for human papilloma virus (HPV), every 5 years. Some types of HPV increase your risk of cervical cancer. Testing for HPV may also be done on women of any age with unclear Pap test results.  Other health care providers may not recommend any screening for nonpregnant women who are considered low risk for pelvic cancer and who do not have symptoms. Ask your health care provider if a screening pelvic exam is right for you.  If you have had past treatment for cervical cancer or  a condition that could lead to cancer, you need  Pap tests and screening for cancer for at least 20 years after your treatment. If Pap tests have been discontinued, your risk factors (such as having a new sexual partner) need to be reassessed to determine if screening should resume. Some women have medical problems that increase the chance of getting cervical cancer. In these cases, your health care provider may recommend more frequent screening and Pap tests. Colorectal Cancer  This type of cancer can be detected and often prevented.  Routine colorectal cancer screening usually begins at 77 years of age and continues through 77 years of age.  Your health care provider may recommend screening at an earlier age if you have risk factors for colon cancer.  Your health care provider may also recommend using home test kits to check for hidden blood in the stool.  A small camera at the end of a tube can be used to examine your colon directly (sigmoidoscopy or colonoscopy). This is done to check for the earliest forms of colorectal cancer.  Routine screening usually begins at age 41.  Direct examination of the colon should be repeated every 5-10 years through 78 years of age. However, you may need to be screened more often if early forms of precancerous polyps or small growths are found. Skin Cancer  Check your skin from head to toe regularly.  Tell your health care provider about any new moles or changes in moles, especially if there is a change in a mole's shape or color.  Also tell your health care provider if you have a mole that is larger than the size of a pencil eraser.  Always use sunscreen. Apply sunscreen liberally and repeatedly throughout the day.  Protect yourself by wearing long sleeves, pants, a wide-brimmed hat, and sunglasses whenever you are outside. Heart disease, diabetes, and high blood pressure  High blood pressure causes heart disease and increases the risk of stroke. High  blood pressure is more likely to develop in:  People who have blood pressure in the high end of the normal range (130-139/85-89 mm Hg).  People who are overweight or obese.  People who are African American.  If you are 45-3 years of age, have your blood pressure checked every 3-5 years. If you are 38 years of age or older, have your blood pressure checked every year. You should have your blood pressure measured twice-once when you are at a hospital or clinic, and once when you are not at a hospital or clinic. Record the average of the two measurements. To check your blood pressure when you are not at a hospital or clinic, you can use:  An automated blood pressure machine at a pharmacy.  A home blood pressure monitor.  If you are between 1 years and 6 years old, ask your health care provider if you should take aspirin to prevent strokes.  Have regular diabetes screenings. This involves taking a blood sample to check your fasting blood sugar level.  If you are at a normal weight and have a low risk for diabetes, have this test once every three years after 77 years of age.  If you are overweight and have a high risk for diabetes, consider being tested at a younger age or more often. Preventing infection Hepatitis B  If you have a higher risk for hepatitis B, you should be screened for this virus. You are considered at high risk for hepatitis B if:  You were born in a country where hepatitis B is common. Ask your  health care provider which countries are considered high risk.  Your parents were born in a high-risk country, and you have not been immunized against hepatitis B (hepatitis B vaccine).  You have HIV or AIDS.  You use needles to inject street drugs.  You live with someone who has hepatitis B.  You have had sex with someone who has hepatitis B.  You get hemodialysis treatment.  You take certain medicines for conditions, including cancer, organ transplantation, and  autoimmune conditions. Hepatitis C  Blood testing is recommended for:  Everyone born from [birth year] through 1966.  Anyone with known risk factors for hepatitis C. Sexually transmitted infections (STIs)  You should be screened for sexually transmitted infections (STIs) including gonorrhea and chlamydia if:  You are sexually active and are younger than 77 years of age.  You are older than 77 years of age and your health care provider tells you that you are at risk for this type of infection.  Your sexual activity has changed since you were last screened and you are at an increased risk for chlamydia or gonorrhea. Ask your health care provider if you are at risk.  If you do not have HIV, but are at risk, it may be recommended that you take a prescription medicine daily to prevent HIV infection. This is called pre-exposure prophylaxis (PrEP). You are considered at risk if:  You are sexually active and do not regularly use condoms or know the HIV status of your partner(s).  You take drugs by injection.  You are sexually active with a partner who has HIV. Talk with your health care provider about whether you are at high risk of being infected with HIV. If you choose to begin PrEP, you should first be tested for HIV. You should then be tested every 3 months for as long as you are taking PrEP. Pregnancy  If you are premenopausal and you may become pregnant, ask your health care provider about preconception counseling.  If you may become pregnant, take 400 to 800 micrograms (mcg) of folic acid every day.  If you want to prevent pregnancy, talk to your health care provider about birth control (contraception). Osteoporosis and menopause  Osteoporosis is a disease in which the bones lose minerals and strength with aging. This can result in serious bone fractures. Your risk for osteoporosis can be identified using a bone density scan.  If you are 41 years of age or older, or if you are at risk for  osteoporosis and fractures, ask your health care provider if you should be screened.  Ask your health care provider whether you should take a calcium or vitamin D supplement to lower your risk for osteoporosis.  Menopause may have certain physical symptoms and risks.  Hormone replacement therapy may reduce some of these symptoms and risks. Talk to your health care provider about whether hormone replacement therapy is right for you. Follow these instructions at home:  Schedule regular health, dental, and eye exams.  Stay current with your immunizations.  Do not use any tobacco products including cigarettes, chewing tobacco, or electronic cigarettes.  If you are pregnant, do not drink alcohol.  If you are breastfeeding, limit how much and how often you drink alcohol.  Limit alcohol intake to no more than 1 drink per day for nonpregnant women. One drink equals 12 ounces of beer, 5 ounces of Jayion Schneck, or 1 ounces of hard liquor.  Do not use street drugs.  Do not share needles.  Ask your  health care provider for help if you need support or information about quitting drugs.  Tell your health care provider if you often feel depressed.  Tell your health care provider if you have ever been abused or do not feel safe at home. This information is not intended to replace advice given to you by your health care provider. Make sure you discuss any questions you have with your health care provider. Document Released: 11/17/2010 Document Revised: 10/10/2015 Document Reviewed: 02/05/2015  2017 [employer]vier

## 2016-07-02 ENCOUNTER — Ambulatory Visit
Admission: RE | Admit: 2016-07-02 | Discharge: 2016-07-02 | Disposition: A | Discharge status: Home / Self Care | Payer: Medicare Other | Source: Ambulatory Visit | Attending: Internal Medicine | Admitting: Internal Medicine

## 2016-07-02 DIAGNOSIS — Z1231 Encounter for screening mammogram for malignant neoplasm of breast: Secondary | ICD-10-CM | POA: Diagnosis not present

## 2016-07-02 DIAGNOSIS — Z1239 Encounter for other screening for malignant neoplasm of breast: Secondary | ICD-10-CM

## 2016-07-02 DIAGNOSIS — M8589 Other specified disorders of bone density and structure, multiple sites: Secondary | ICD-10-CM | POA: Diagnosis not present

## 2016-07-02 DIAGNOSIS — E2839 Other primary ovarian failure: Secondary | ICD-10-CM

## 2016-07-02 DIAGNOSIS — Z780 Asymptomatic menopausal state: Secondary | ICD-10-CM | POA: Diagnosis not present

## 2016-08-18 ENCOUNTER — Other Ambulatory Visit: Payer: Self-pay | Admitting: Internal Medicine

## 2016-08-27 ENCOUNTER — Telehealth: Payer: Self-pay | Admitting: Internal Medicine

## 2016-08-27 NOTE — Telephone Encounter (Signed)
Can place referral for ENT or happy to have her move appointment up with [place] if she wants [place] to evaluate it.

## 2016-08-27 NOTE — Telephone Encounter (Signed)
Patient moving apt up

## 2016-08-27 NOTE — Telephone Encounter (Signed)
Pt called and said that she has had a sore inside her nose that has been there since around Christmas. She said that it is very sore and bleeds sometimes. She is a bit concerned about it and did not know if she should see a dermatologist or an ENT. She does have an appointment on 09/14/16 with Dr Sharlet Salina but did not know if it should be checked out before then. Please advise.

## 2016-08-31 ENCOUNTER — Encounter: Payer: Self-pay | Admitting: Internal Medicine

## 2016-08-31 ENCOUNTER — Ambulatory Visit (INDEPENDENT_AMBULATORY_CARE_PROVIDER_SITE_OTHER): Payer: Medicare Other | Admitting: Internal Medicine

## 2016-08-31 ENCOUNTER — Other Ambulatory Visit (INDEPENDENT_AMBULATORY_CARE_PROVIDER_SITE_OTHER): Payer: Medicare Other

## 2016-08-31 VITALS — BP 130/70 | HR 76 | Temp 98.1°F | Resp 12 | Ht 61.0 in | Wt 113.0 lb

## 2016-08-31 DIAGNOSIS — F419 Anxiety disorder, unspecified: Secondary | ICD-10-CM

## 2016-08-31 DIAGNOSIS — F5105 Insomnia due to other mental disorder: Secondary | ICD-10-CM | POA: Diagnosis not present

## 2016-08-31 DIAGNOSIS — E785 Hyperlipidemia, unspecified: Secondary | ICD-10-CM

## 2016-08-31 DIAGNOSIS — E039 Hypothyroidism, unspecified: Secondary | ICD-10-CM

## 2016-08-31 DIAGNOSIS — J3489 Other specified disorders of nose and nasal sinuses: Secondary | ICD-10-CM | POA: Insufficient documentation

## 2016-08-31 LAB — LIPID PANEL
Cholesterol: 239 mg/dL — ABNORMAL HIGH (ref 0–200)
HDL: 54 mg/dL (ref >39.00)
LDL Cholesterol: 163 mg/dL — ABNORMAL HIGH (ref 0–99)
NONHDL: 185.04
Total CHOL/HDL Ratio: 4
Triglycerides: 109 mg/dL (ref 0.0–149.0)
VLDL: 21.8 mg/dL (ref 0.0–40.0)

## 2016-08-31 LAB — COMPREHENSIVE METABOLIC PANEL
ALK PHOS: 102 U/L (ref 39–117)
ALT: 16 U/L (ref 0–35)
AST: 24 U/L (ref 0–37)
Albumin: 4.3 g/dL (ref 3.5–5.2)
BILIRUBIN TOTAL: 0.4 mg/dL (ref 0.2–1.2)
BUN: 18 mg/dL (ref 6–23)
CO2: 29 mEq/L (ref 19–32)
Calcium: 9.8 mg/dL (ref 8.4–10.5)
Chloride: 102 mEq/L (ref 96–112)
Creatinine, Ser: 0.72 mg/dL (ref 0.40–1.20)
GFR: 83.44 mL/min (ref >60.00)
GLUCOSE: 91 mg/dL (ref 70–99)
POTASSIUM: 4.2 meq/L (ref 3.5–5.1)
SODIUM: 139 meq/L (ref 135–145)
TOTAL PROTEIN: 7.9 g/dL (ref 6.0–8.3)

## 2016-08-31 LAB — URINALYSIS, ROUTINE W REFLEX MICROSCOPIC
BILIRUBIN URINE: NEGATIVE
HGB URINE DIPSTICK: NEGATIVE
KETONES UR: NEGATIVE
LEUKOCYTES UA: NEGATIVE
NITRITE: NEGATIVE
Specific Gravity, Urine: 1.01 (ref 1.000–1.030)
TOTAL PROTEIN, URINE-UPE24: NEGATIVE
URINE GLUCOSE: NEGATIVE
Urobilinogen, UA: 0.2 (ref 0.0–1.0)
pH: 7.5 (ref 5.0–8.0)

## 2016-08-31 LAB — TSH: TSH: 1.87 u[IU]/mL (ref 0.35–4.50)

## 2016-08-31 NOTE — Progress Notes (Signed)
   Subjective:    Patient ID: Marilyn Blake, female    DOB: 27-Mar-1941, 76 y.o.   MRN: [id number]  HPI The patient is a 76 YO female coming in for new problem of sore in her nose (there for about 3-4 months, some nosebleed as well, was using humidifier and stopped about 1 month ago, has not tried anything for it), and follow up of her thyroid (taking 75 and 50 mcg levothyroxine alternating, no new weight change, tremors, fatigue, heat/cold intolerance), and her cholesterol (taking simvastatin 20 mg daily, no leg cramps or muscle pains, denies chest pains, stroke symptoms), and anxiety (rare valium usage, takes only when needed, denies depression or worsening anxiety). Denies other new concerns.   Review of Systems  Constitutional: Negative.   HENT: Positive for nosebleeds. Negative for congestion, dental problem, ear discharge, postnasal drip, rhinorrhea, sore throat and tinnitus.   Eyes: Negative.   Respiratory: Negative for cough, chest tightness and shortness of breath.   Cardiovascular: Negative for chest pain, palpitations and leg swelling.  Gastrointestinal: Negative for abdominal distention, abdominal pain, constipation, diarrhea, nausea and vomiting.  Musculoskeletal: Negative.   Skin: Negative.   Neurological: Negative.   Psychiatric/Behavioral: Negative.       Objective:   Physical Exam  Constitutional: She is oriented to person, place, and time. She appears well-developed and well-nourished.  HENT:  Head: Normocephalic and atraumatic.  Nose with punctate sore with crusting at the apex, no underlying abscess or drainage, no deviated septum and overall no stigmata of prolonged picking  Eyes: EOM are normal.  Neck: Normal range of motion.  Cardiovascular: Normal rate and regular rhythm.   Pulmonary/Chest: Effort normal and breath sounds normal. No respiratory distress. She has no wheezes. She has no rales.  Abdominal: Soft. Bowel sounds are normal. She exhibits no distension.  There is no tenderness. There is no rebound.  Musculoskeletal: She exhibits no edema.  Neurological: She is alert and oriented to person, place, and time. Coordination normal.  Skin: Skin is warm and dry.  Psychiatric: She has a normal mood and affect.   Vitals:   08/31/16 1312  BP: 130/70  Pulse: 76  Resp: 12  Temp: 98.1 F (36.7 C)  TempSrc: Oral  SpO2: 97%  Weight: 113 lb (51.3 kg)  Height: 5\' 1"  (1.549 m)      Assessment & Plan:

## 2016-08-31 NOTE — Assessment & Plan Note (Signed)
Taking synthroid 50 mcg and 75 mcg alternating. Checking TSH and adjust dosing as needed. No symptoms of under or over replacement.

## 2016-08-31 NOTE — Assessment & Plan Note (Signed)
Without signs of infection or underlying abscess. Does not appear malignant. Advised to use vaseline on the area and avoid picking or scratching or excessive nose blowing.

## 2016-08-31 NOTE — Progress Notes (Signed)
Pre visit review using our clinic review tool, if applicable. No additional management support is needed unless otherwise documented below in the visit note. 

## 2016-08-31 NOTE — Assessment & Plan Note (Signed)
Taking valium rarely and discussed with her risks and harms including memory change and increased risk of falls from long term use. She does use rarely and will continue to do so.

## 2016-08-31 NOTE — Patient Instructions (Addendum)
Shingrix is the new shingles shot to ask about the cost. It is 2 shots that you get a couple months apart.   You can use vaseline on your nose and it should heal up. Get back to using the humidifier.

## 2016-08-31 NOTE — Assessment & Plan Note (Signed)
Checking lipid panel and adjust simvastatin 20 mg daily as needed.  

## 2016-09-14 ENCOUNTER — Ambulatory Visit: Payer: Medicare Other | Admitting: Internal Medicine

## 2016-10-04 ENCOUNTER — Other Ambulatory Visit: Payer: Self-pay | Admitting: Internal Medicine

## 2016-10-16 ENCOUNTER — Other Ambulatory Visit: Payer: Self-pay | Admitting: Internal Medicine

## 2016-10-26 ENCOUNTER — Ambulatory Visit: Payer: Medicare Other | Admitting: Adult Health

## 2016-10-28 ENCOUNTER — Telehealth: Payer: Self-pay | Admitting: Internal Medicine

## 2016-10-28 MED ORDER — PREDNISONE 10 MG PO TABS: ORAL_TABLET | ORAL | 0 refills | Status: DC

## 2016-10-28 NOTE — Telephone Encounter (Signed)
Pt is aware of CY's recommendations and voiced her understanding. RX for prednisone has been sent to preferred pharmacy. Nothing further needed.

## 2016-10-28 NOTE — Telephone Encounter (Signed)
Suggest dc Zyrtec  Offer prednisone 10 mg, # 20, 4 X 2 DAYS, 3 X 2 DAYS, 2 X 2 DAYS, 1 X 2 DAYS  After prednisone, if she still needs an antihistamine, suggest Claritin/ loratadine (otc)   Let us know if this doesn't take care of it.

## 2016-10-28 NOTE — Telephone Encounter (Signed)
Spoke with pt, who reports of mild prod cough nwith light yellow mucus, wheezing, increased sob, increased fatigued x14mo and itching all over x3-49mo. Pt states her husband read about people being allergic to zyrtec. pt is concerned that she may be allergic to zyrtec, as she takes this medication daily. Pt states she has been taken zyrtec x19mo.   CY please advise. Thanks.  Current Outpatient Prescriptions on File Prior to Visit  Medication Sig Dispense Refill  . CALCIUM-VITAMIN D PO Take by mouth 1 day or 1 dose.    . cetirizine (ZYRTEC) 10 MG tablet Take 10 mg by mouth daily.      . clotrimazole-betamethasone (LOTRISONE) cream Apply 1 application topically 2 (two) times daily. 30 g 0  . diazepam (VALIUM) 5 MG tablet TAKE ONE TABLET BY MOUTH EVERY 12 HOURS AS NEEDED FOR ANXIETY 30 tablet 2  . diphenhydramine-acetaminophen (TYLENOL PM) 25-500 MG TABS tablet Take 0.5 tablets by mouth at bedtime as needed.    . fluconazole (DIFLUCAN) 150 MG tablet Take 1 tablet (150 mg total) by mouth every 3 (three) days. 3 tablet 0  . hyoscyamine (LEVSIN SL) 0.125 MG SL tablet Place 1 tablet (0.125 mg total) under the tongue every 4 (four) hours as needed. 30 tablet 0  . levothyroxine (SYNTHROID, LEVOTHROID) 50 MCG tablet Take 50 mcg by mouth every other day.     . levothyroxine (SYNTHROID, LEVOTHROID) 50 MCG tablet TAKE ONE TABLET BY MOUTH EVERY OTHER DAY 45 tablet 3  . levothyroxine (SYNTHROID, LEVOTHROID) 75 MCG tablet TAKE ONE TABLET BY MOUTH EVERY OTHER DAY ALTERNATE  WITH  50MCG 45 tablet 3  . Multiple Minerals-Vitamins (CALCIUM & VIT D3 BONE HEALTH PO) Take by mouth. Reported on 09/09/2015    . OMEGA 3 1000 MG CAPS Take by mouth daily.      Marland Kitchen omeprazole (PRILOSEC) 20 MG capsule Take 1 capsule (20 mg total) by mouth daily. 90 capsule 3  . Probiotic Product (PROBIOTIC-10 PO) Take by mouth 1 day or 1 dose.    . Psyllium (METAMUCIL PO) Take by mouth every other day.    . Sennosides (SENOKOT PO) Take by mouth.     . simvastatin (ZOCOR) 20 MG tablet TAKE ONE TABLET BY MOUTH AT BEDTIME 83 tablet 0  . triamcinolone cream (KENALOG) 0.1 % Apply 1 application topically 2 (two) times daily. 30 g 0   No current facility-administered medications on file prior to visit.     Allergies  Allergen Reactions  . Ethambutol Hcl     REACTION: stomach pain  . Moxifloxacin     REACTION: mild rash - causal??

## 2016-11-12 ENCOUNTER — Encounter (HOSPITAL_COMMUNITY): Payer: Self-pay | Admitting: *Deleted

## 2016-11-12 ENCOUNTER — Emergency Department (HOSPITAL_COMMUNITY): Payer: Medicare Other

## 2016-11-12 ENCOUNTER — Ambulatory Visit: Payer: Self-pay | Admitting: Internal Medicine

## 2016-11-12 ENCOUNTER — Emergency Department (HOSPITAL_COMMUNITY)
Admission: EM | Admit: 2016-11-12 | Discharge: 2016-11-12 | Disposition: A | Discharge status: Home / Self Care | Payer: Medicare Other | Attending: Emergency Medicine | Admitting: Emergency Medicine

## 2016-11-12 ENCOUNTER — Telehealth: Payer: Self-pay | Admitting: Internal Medicine

## 2016-11-12 DIAGNOSIS — Z79899 Other long term (current) drug therapy: Secondary | ICD-10-CM | POA: Insufficient documentation

## 2016-11-12 DIAGNOSIS — E039 Hypothyroidism, unspecified: Secondary | ICD-10-CM | POA: Insufficient documentation

## 2016-11-12 DIAGNOSIS — R072 Precordial pain: Secondary | ICD-10-CM | POA: Insufficient documentation

## 2016-11-12 DIAGNOSIS — R079 Chest pain, unspecified: Secondary | ICD-10-CM

## 2016-11-12 DIAGNOSIS — J449 Chronic obstructive pulmonary disease, unspecified: Secondary | ICD-10-CM | POA: Insufficient documentation

## 2016-11-12 DIAGNOSIS — R0789 Other chest pain: Secondary | ICD-10-CM | POA: Diagnosis not present

## 2016-11-12 LAB — CBC
HEMATOCRIT: 40.3 % (ref 36.0–46.0)
HEMOGLOBIN: 13.3 g/dL (ref 12.0–15.0)
MCH: 30.7 pg (ref 26.0–34.0)
MCHC: 33 g/dL (ref 30.0–36.0)
MCV: 93.1 fL (ref 78.0–100.0)
Platelets: 163 10*3/uL (ref 150–400)
RBC: 4.33 MIL/uL (ref 3.87–5.11)
RDW: 13.3 % (ref 11.5–15.5)
WBC: 7.7 10*3/uL (ref 4.0–10.5)

## 2016-11-12 LAB — BASIC METABOLIC PANEL
Anion gap: 6 (ref 5–15)
BUN: 14 mg/dL (ref 6–20)
CHLORIDE: 105 mmol/L (ref 101–111)
CO2: 27 mmol/L (ref 22–32)
CREATININE: 0.59 mg/dL (ref 0.44–1.00)
Calcium: 9.1 mg/dL (ref 8.9–10.3)
GFR calc Af Amer: >60 mL/min (ref >60)
GFR calc non Af Amer: >60 mL/min (ref >60)
Glucose, Bld: 87 mg/dL (ref 65–99)
Potassium: 3.9 mmol/L (ref 3.5–5.1)
Sodium: 138 mmol/L (ref 135–145)

## 2016-11-12 LAB — HEPATIC FUNCTION PANEL
ALT: 14 U/L (ref 14–54)
AST: 23 U/L (ref 15–41)
Albumin: 4 g/dL (ref 3.5–5.0)
Alkaline Phosphatase: 85 U/L (ref 38–126)
BILIRUBIN TOTAL: 0.5 mg/dL (ref 0.3–1.2)
Total Protein: 7.4 g/dL (ref 6.5–8.1)

## 2016-11-12 LAB — I-STAT TROPONIN, ED: Troponin i, poc: 0 ng/mL (ref 0.00–0.08)

## 2016-11-12 LAB — LIPASE, BLOOD: Lipase: 20 U/L (ref 11–51)

## 2016-11-12 MED ORDER — FAMOTIDINE 20 MG PO TABS
20.0000 mg | ORAL_TABLET | Freq: Two times a day (BID) | ORAL | 0 refills | Status: DC
Start: 2016-11-12 — End: 2017-05-04

## 2016-11-12 MED ORDER — GI COCKTAIL ~~LOC~~
30.0000 mL | Freq: Once | ORAL | Status: AC
  Administered 2016-11-12: 30 mL via ORAL
  Filled 2016-11-12: qty 30

## 2016-11-12 MED ORDER — FAMOTIDINE 20 MG PO TABS
20.0000 mg | ORAL_TABLET | Freq: Once | ORAL | Status: AC
  Administered 2016-11-12: 20 mg via ORAL
  Filled 2016-11-12: qty 1

## 2016-11-12 MED ORDER — SUCRALFATE 1 G PO TABS: 1.0000 g | ORAL_TABLET | Freq: Three times a day (TID) | ORAL | 0 refills | Status: DC

## 2016-11-12 NOTE — Telephone Encounter (Signed)
Pt is scheduled to see Dr Jenny Reichmann at 5 pm

## 2016-11-12 NOTE — ED Provider Notes (Signed)
[place] DEPT Provider Note   CSN: [id number] Arrival date & time: 11/12/16  1418     History   Chief Complaint Chief Complaint  Patient presents with  . Chest Pain    HPI Marilyn Blake is a 76 y.o. female.  Patient with history of hyperlipidemia, GERD, hypothyroidism -- presents with complaint of chest pain. Patient describes having a mid sternal pressure pain occurring at rest intermittently for the past week. It lasts for just a couple of minutes before resolving. It is not associated with food or activity. Patient does not have any associated vomiting, diaphoresis. Patient exercises during the week and has not had any symptoms with exercise or decrease in exercise tolerance. Symptoms are not associated with food but do seem to get worse when she lies flat. No lightheadedness or syncope. No fevers or persistent cough. No abdominal pain or leg swelling. Patient denies risk factors for pulmonary embolism including: unilateral leg swelling, history of DVT/PE/other blood clots, use of exogenous hormones, recent immobilizations, recent surgery, recent travel (>4hr segment), malignancy, hemoptysis. No treatments prior to arrival.       Past Medical History:  Diagnosis Date  . ALLERGIC RHINITIS   . ANXIETY   . Arthritis   . BACTEREMIA, MYCOBACTERIUM AVIUM COMPLEX   . BRONCHIECTASIS   . COLONIC POLYPS, HX OF   . Fundic gland polyps of stomach, benign   . GERD   . GOUT   . Headache   . HYPERLIPIDEMIA   . HYPOTHYROIDISM   . Irritable bowel syndrome   . OSTEOPENIA     Patient Active Problem List   Diagnosis Date Noted  . Sore in nose 08/31/2016  . Palpitations 09/10/2015  . Seasonal rhinitis 02/03/2015  . Bronchitis, chronic obstructive, with exacerbation-[facility] 10/24/2013  . 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 IRRITABLE BOWEL SYNDROME 08/07/2009  . GERD 02/21/2009  . Bronchiectasis ([place]) 02/16/2008  . Hypothyroidism 07/11/2007  . Hyperlipidemia 07/11/2007    . Osteopenia 07/11/2007    Past Surgical History:  Procedure Laterality Date  . ABDOMINAL HYSTERECTOMY    . BLADDER SUSPENSION    . CARPAL TUNNEL RELEASE Left   . COLONOSCOPY    . MASS EXCISION Right 08/10/2013   Procedure: DEBRIDE DISTAL INTERPHALANGEAL JOINT/EXCISION MUCOID CYST RIGHT LONG FINGER and right small finger;  Surgeon: Cammie Sickle., MD;  Location: [facility];  Service: Orthopedics;  Laterality: Right;  . THYROIDECTOMY     Hurthele cell tumor  . UPPER GASTROINTESTINAL ENDOSCOPY      OB History    No data available       Home Medications    Prior to Admission medications   Medication Sig Start Date End Date Taking? Authorizing Provider  CALCIUM-VITAMIN D PO Take by mouth 1 day or 1 dose.    [provider]  cetirizine (ZYRTEC) 10 MG tablet Take 10 mg by mouth daily.      [provider]  clotrimazole-betamethasone (LOTRISONE) cream Apply 1 application topically 2 (two) times daily. 02/28/16   Hoyt Koch, MD  diazepam (VALIUM) 5 MG tablet TAKE ONE TABLET BY MOUTH EVERY 12 HOURS AS NEEDED FOR ANXIETY 10/16/16   Hoyt Koch, MD  diphenhydramine-acetaminophen (TYLENOL PM) 25-500 MG TABS tablet Take 0.5 tablets by mouth at bedtime as needed.    [provider]  fluconazole (DIFLUCAN) 150 MG tablet Take 1 tablet (150 mg total) by mouth every 3 (three) days. 02/28/16   Hoyt Koch, MD  hyoscyamine (  LEVSIN SL) 0.125 MG SL tablet Place 1 tablet (0.125 mg total) under the tongue every 4 (four) hours as needed. 12/10/15   Gatha Mayer, MD  levothyroxine (SYNTHROID, LEVOTHROID) 50 MCG tablet Take 50 mcg by mouth every other day.  11/27/15   [provider]  levothyroxine (SYNTHROID, LEVOTHROID) 50 MCG tablet TAKE ONE TABLET BY MOUTH EVERY OTHER DAY 10/16/16   Hoyt Koch, MD  levothyroxine (SYNTHROID, LEVOTHROID) 75 MCG tablet TAKE ONE TABLET BY MOUTH EVERY OTHER DAY ALTERNATE  WITH   50MCG 10/05/16   Hoyt Koch, MD  Multiple Minerals-Vitamins (CALCIUM & VIT D3 BONE HEALTH PO) Take by mouth. Reported on 09/09/2015    [provider]  OMEGA 3 1000 MG CAPS Take by mouth daily.      [provider]  omeprazole (PRILOSEC) 20 MG capsule Take 1 capsule (20 mg total) by mouth daily. 02/12/16   Gatha Mayer, MD  predniSONE (DELTASONE) 10 MG tablet 4 tabs x 2 days, 3 tabs x 2 days, 2 tabs x 2 days, 1 tab x 2 days then stop 10/28/16   Baird Lyons D, MD  Probiotic Product (PROBIOTIC-10 PO) Take by mouth 1 day or 1 dose.    [provider]  Psyllium (METAMUCIL PO) Take by mouth every other day.    [provider]  Sennosides (SENOKOT PO) Take by mouth.    [provider]  simvastatin (ZOCOR) 20 MG tablet TAKE ONE TABLET BY MOUTH AT BEDTIME 08/18/16   Hoyt Koch, MD  triamcinolone cream (KENALOG) 0.1 % Apply 1 application topically 2 (two) times daily. 02/28/16   Hoyt Koch, MD    Family History Family History  Problem Relation Age of Onset  . Cervical cancer Mother 89        died age 57  . Heart attack Father 28  . Colon cancer Neg Hx   . Esophageal cancer Neg Hx   . Stomach cancer Neg Hx   . Rectal cancer Neg Hx     Social History Social History  Substance Use Topics  . Smoking status: Never Smoker  . Smokeless tobacco: Never Used     Comment: Married, Housewife  . Alcohol use No     Allergies   Ethambutol hcl and Moxifloxacin   Review of Systems Review of Systems  Constitutional: Negative for diaphoresis and fever.  Eyes: Negative for redness.  Respiratory: Positive for shortness of breath. Negative for cough.   Cardiovascular: Positive for chest pain. Negative for palpitations and leg swelling.  Gastrointestinal: Negative for abdominal pain, nausea and vomiting.  Genitourinary: Negative for dysuria.  Musculoskeletal: Negative for back pain and neck pain.  Skin: Negative for rash.    Neurological: Negative for syncope and light-headedness.  Psychiatric/Behavioral: The patient is not nervous/anxious.      Physical Exam Updated Vital Signs BP 119/90 (BP Location: Right Arm)   Pulse 80   Temp 97.5 F (36.4 C)   Resp 18   Ht _0  (1.575 m)   Wt 50.8 kg (112 lb)   LMP 08/10/2013 Comment: post menopausal  SpO2 95%   BMI 20.49 kg/m   Physical Exam  Constitutional: She appears well-developed and well-nourished.  HENT:  Head: Normocephalic and atraumatic.  Mouth/Throat: Mucous membranes are normal. Mucous membranes are not dry.  Eyes: Conjunctivae are normal. Right eye exhibits no discharge. Left eye exhibits no discharge.  Neck: Trachea normal and normal range of motion. Neck supple. Normal carotid pulses  and no JVD present. No muscular tenderness present. Carotid bruit is not present. No tracheal deviation present.  Cardiovascular: Normal rate, regular rhythm, S1 normal, S2 normal, normal heart sounds and intact distal pulses.  Exam reveals no decreased pulses.   No murmur heard. Pulmonary/Chest: Effort normal and breath sounds normal. No respiratory distress. She has no wheezes. She exhibits no tenderness.  Abdominal: Soft. Normal aorta and bowel sounds are normal. There is no tenderness. There is no rebound and no guarding.  Musculoskeletal: Normal range of motion.  Neurological: She is alert.  Skin: Skin is warm and dry. She is not diaphoretic. No cyanosis. No pallor.  Psychiatric: She has a normal mood and affect.  Nursing note and vitals reviewed.    ED Treatments / Results  Labs (all labs ordered are listed, but only abnormal results are displayed) Labs Reviewed  HEPATIC FUNCTION PANEL - Abnormal; Notable for the following:       Result Value   Bilirubin, Direct <0.1 (*)    All other components within normal limits  CBC  BASIC METABOLIC PANEL  LIPASE, BLOOD  I-STAT TROPOININ, ED    EKG  EKG Interpretation  Date/Time:  Thursday November 12 2016  16:16:19 EDT Ventricular Rate:  70 PR Interval:    QRS Duration: 90 QT Interval:  421 QTC Calculation: 455 R Axis:   66 Text Interpretation:  Sinus rhythm Consider left atrial enlargement Borderline T abnormalities, anterior leads Since last EKG, there is mild ST flattening in lead V2 Otherwise no significant change Confirmed by Duffy Bruce [phone]) on 11/12/2016 4:19:07 PM       Radiology Dg Chest 2 View  Result Date: 11/12/2016 CLINICAL DATA:  Chest pain for the past week. EXAM: CHEST  2 VIEW COMPARISON:  01/31/2015. FINDINGS: Normal sized heart. The lungs remain hyperexpanded. No significant change in scarring in the lingula and right middle lobe. Thoracic spine degenerative changes. Diffuse osteopenia. IMPRESSION: No acute abnormality. Electronically Signed   By: Claudie Revering M.D.   On: 11/12/2016 17:03    Procedures Procedures (including critical care time)  Medications Ordered in ED Medications  gi cocktail (Maalox,Lidocaine,Donnatal) (30 mLs Oral Given 11/12/16 1821)  famotidine (PEPCID) tablet 20 mg (20 mg Oral Given 11/12/16 1821)     Initial Impression / Assessment and Plan / ED Course  I have reviewed the triage vital signs and the nursing notes.  Pertinent labs & imaging results that were available during my care of the patient were reviewed by me and considered in my medical decision making (see chart for details).     Patient seen and examined. Work-up initiated. EKG reviewed with Dr. Ellender Hose. Seen by Dr. Ellender Hose.   Vital signs reviewed and are as follows: BP 119/90 (BP Location: Right Arm)   Pulse 80   Temp 97.5 F (36.4 C)   Resp 18   Ht _0  (1.575 m)   Wt 50.8 kg (112 lb)   LMP 08/10/2013 Comment: post menopausal  SpO2 95%   BMI 20.49 kg/m   Patient much improved with GI cocktail. Workup was negative. Was discharged home with Pepcid and Carafate. Encouraged PCP follow-up for recheck. Patient will continue home omeprazole.  Patient was counseled to  return with severe chest pain, especially if the pain is crushing or pressure-like and spreads to the arms, back, neck, or jaw, or if they have sweating, nausea, or shortness of breath with the pain. They were encouraged to call 911 with these symptoms.   They were  also told to return if their chest pain gets worse and does not go away with rest, they have an attack of chest pain lasting longer than usual despite rest and treatment with the medications their caregiver has prescribed, if they wake from sleep with chest pain or shortness of breath, if they feel dizzy or faint, if they have chest pain not typical of their usual pain, or if they have any other emergent concerns regarding their health.  The patient verbalized understanding and agreed.    Final Clinical Impressions(s) / ED Diagnoses   Final diagnoses:  Chest pain, unspecified type   Patient with atypical chest pain. Troponin negative, EKG unchanged, chest x-ray is clear. Symptoms responded well to GI cocktail and feels much better. No clinical signs of DVT or PE. Do not suspect ACS, PE, dissection.  New Prescriptions New Prescriptions   FAMOTIDINE (PEPCID) 20 MG TABLET    Take 1 tablet (20 mg total) by mouth 2 (two) times daily.   SUCRALFATE (CARAFATE) 1 G TABLET    Take 1 tablet (1 g total) by mouth 4 (four) times daily -  with meals and at bedtime.     Carlisle Cater, PA-C 11/12/16 1845    Duffy Bruce, MD 11/12/16 [phone]

## 2016-11-12 NOTE — Telephone Encounter (Signed)
Patient Name: SHAYANNA THATCH DOB: 1940/06/13 Initial Comment Caller states having discomfort in chest and soreness in chest Nurse Assessment Nurse: Ronnald Ramp, RN, Miranda Date/Time (Eastern Time): 11/12/2016 10:24:28 AM Confirm and document reason for call. If symptomatic, describe symptoms. ---Caller states she is having discomfort and soreness in her chest. She has had a cough and treated with steroids by her pulmonologist 2 weeks ago. She has been feeling worse for 2 day. Does the patient have any new or worsening symptoms? ---Yes Will a triage be completed? ---Yes Related visit to physician within the last 2 weeks? ---No Does the PT have any chronic conditions? (i.e. diabetes, asthma, etc.) ---Yes List chronic conditions. 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 ---No Guidelines Guideline Title Affirmed Question Affirmed Notes Chest Pain [1] Chest pain lasting <= 5 minutes AND [2] NO chest pain or cardiac symptoms now (Exceptions: pains lasting a few seconds) Final Disposition User See Physician within 24 Hours Ronnald Ramp, Therapist, sports, Miranda Comments No appt available with PCP. Appt scheduled with Dr. Jenny Reichmann for 5pm Referrals REFERRED TO PCP OFFICE Disagree/Comply: Comply

## 2016-11-12 NOTE — ED Triage Notes (Signed)
Patient is alert and oriented x4.  She is being seen for chest pain that has been on going for about a week.  Patient describes the pain as mid chest with radiation on the left.  Currently she rates her pain 5 of 10.

## 2016-11-12 NOTE — ED Provider Notes (Signed)
Medical screening examination/treatment/procedure(s) were conducted as a shared visit with non-physician practitioner(s) and myself.  I personally evaluated the patient during the encounter. Briefly, the patient is a 76 yo F with PMHx chronic GERD/gastritis here with epigastric pain in setting of recently taking steroids. History and exam is most c/w GI-related pain. EKG non-ischemic (unchanged) and trop neg. Doubt ACS, PE, dissection. D/c with antacid therapy.   EKG Interpretation  Date/Time:  Thursday November 12 2016 16:16:19 EDT Ventricular Rate:  70 PR Interval:    QRS Duration: 90 QT Interval:  421 QTC Calculation: 455 R Axis:   66 Text Interpretation:  Sinus rhythm Consider left atrial enlargement Borderline T abnormalities, anterior leads Since last EKG, there is mild ST flattening in lead V2 Otherwise no significant change Confirmed by Duffy Bruce [phone]) on 11/12/2016 4:19:07 PM          Duffy Bruce, MD 11/12/16 2353

## 2016-11-12 NOTE — Discharge Instructions (Signed)
Please read and follow all provided instructions.  Your diagnoses today include:  1. Chest pain, unspecified type     Tests performed today include:  An EKG of your heart  A chest x-ray  Cardiac enzymes - a blood test for heart muscle damage  Blood counts and electrolytes  Vital signs. See below for your results today.   Medications prescribed:   Pepcid (famotidine) - antihistamine  You can find this medication over-the-counter.   DO NOT exceed:   20mg  Pepcid every 12 hours   Carafate - for stomach upset and to protect your stomach  Take any prescribed medications only as directed.  Follow-up instructions: Please follow-up with your primary care provider as soon as you can for further evaluation of your symptoms.   Return instructions:  SEEK IMMEDIATE MEDICAL ATTENTION IF:  You have severe chest pain, especially if the pain is crushing or pressure-like and spreads to the arms, back, neck, or jaw, or if you have sweating, nausea (feeling sick to your stomach), or shortness of breath. THIS IS AN EMERGENCY. Don't wait to see if the pain will go away. Get medical help at once. Call 911 or 0 (operator). DO NOT drive yourself to the hospital.   Your chest pain gets worse and does not go away with rest.   You have an attack of chest pain lasting longer than usual, despite rest and treatment with the medications your caregiver has prescribed.   You wake from sleep with chest pain or shortness of breath.  You feel dizzy or faint.  You have chest pain not typical of your usual pain for which you originally saw your caregiver.   You have any other emergent concerns regarding your health.  Additional Information: Chest pain comes from many different causes. Your caregiver has diagnosed you as having chest pain that is not specific for one problem, but does not require admission.  You are at low risk for an acute heart condition or other serious illness.   Your vital signs  today were: BP 128/73 (BP Location: Right Arm)    Pulse 60    Temp 97.5 F (36.4 C)    Resp 18    Ht 5\' 2"  (1.575 m)    Wt 50.8 kg (112 lb)    LMP 08/10/2013 Comment: post menopausal   SpO2 98%    BMI 20.49 kg/m  If your blood pressure (BP) was elevated above 135/85 this visit, please have this repeated by your doctor within one month. --------------

## 2016-11-21 ENCOUNTER — Other Ambulatory Visit: Payer: Self-pay | Admitting: Internal Medicine

## 2016-11-26 DIAGNOSIS — L298 Other pruritus: Secondary | ICD-10-CM | POA: Diagnosis not present

## 2016-11-26 DIAGNOSIS — L0109 Other impetigo: Secondary | ICD-10-CM | POA: Diagnosis not present

## 2016-11-26 DIAGNOSIS — L0889 Other specified local infections of the skin and subcutaneous tissue: Secondary | ICD-10-CM | POA: Diagnosis not present

## 2016-11-28 ENCOUNTER — Other Ambulatory Visit: Payer: Self-pay | Admitting: Internal Medicine

## 2016-12-09 ENCOUNTER — Encounter: Payer: Self-pay | Admitting: Nurse Practitioner

## 2016-12-09 ENCOUNTER — Ambulatory Visit (INDEPENDENT_AMBULATORY_CARE_PROVIDER_SITE_OTHER): Payer: Medicare Other | Admitting: Nurse Practitioner

## 2016-12-09 VITALS — BP 110/62 | HR 71 | Temp 97.6°F | Ht 62.0 in | Wt 111.0 lb

## 2016-12-09 DIAGNOSIS — L299 Pruritus, unspecified: Secondary | ICD-10-CM | POA: Diagnosis not present

## 2016-12-09 MED ORDER — HYDROXYZINE HCL 10 MG PO TABS
10.0000 mg | ORAL_TABLET | Freq: Every evening | ORAL | 0 refills | Status: DC | PRN
Start: 2016-12-09 — End: 2017-09-28

## 2016-12-09 NOTE — Patient Instructions (Addendum)
Do not take vistaril within 2hrs of taking valium.  Pruritus Pruritus is an itching feeling. There are many different conditions and factors that can make your skin itchy. Dry skin is one of the most common causes of itching. Most cases of itching do not require medical attention. Itchy skin can turn into a rash. Follow these instructions at home: Watch your pruritus for any changes. Take these steps to help with your condition: Skin Care  Moisturize your skin as needed. A moisturizer that contains petroleum jelly is best for keeping moisture in your skin.  Take or apply medicines only as directed by your health care provider. This may include: ? Corticosteroid cream. ? Anti-itch lotions. ? Oral anti-histamines.  Apply cool compresses to the affected areas.  Try taking a bath with: ? Epsom salts. Follow the instructions on the packaging. You can get these at your local pharmacy or grocery store. ? Baking soda. Pour a small amount into the bath as directed by your health care provider. ? Colloidal oatmeal. Follow the instructions on the packaging. You can get this at your local pharmacy or grocery store.  Try applying baking soda paste to your skin. Stir water into baking soda until it reaches a paste-like consistency.  Do not scratch your skin.  Avoid hot showers or baths, which can make itching worse. A cold shower may help with itching as long as you use a moisturizer after.  Avoid scented soaps, detergents, and perfumes. Use gentle soaps, detergents, perfumes, and other cosmetic products. General instructions  Avoid wearing tight clothes.  Keep a journal to help track what causes your itch. Write down: ? What you eat. ? What cosmetic products you use. ? What you drink. ? What you wear. This includes jewelry.  Use a humidifier. This keeps the air moist, which helps to prevent dry skin. Contact a health care provider if:  The itching does not go away after several  days.  You sweat at night.  You have weight loss.  You are unusually thirsty.  You urinate more than normal.  You are more tired than normal.  You have abdominal pain.  Your skin tingles.  You feel weak.  Your skin or the whites of your eyes look yellow (jaundice).  Your skin feels numb. This information is not intended to replace advice given to you by your health care provider. Make sure you discuss any questions you have with your health care provider. Document Released: 01/14/2011 Document Revised: 10/10/2015 Document Reviewed: 04/30/2014 Elsevier Interactive Patient Education  [employer].

## 2016-12-09 NOTE — Progress Notes (Signed)
Subjective:  Patient ID: Marilyn Blake, female    DOB: 1940-11-13  Age: 76 y.o. MRN: [id number]  CC: Follow-up (itching on arms and legs--worse when get into bed--place on nose (dry) on abx from dermatologist. )   HPI  Itching: Generalized Worse at night. Ongoing since 05/2016. Some improvement with claritin and benadryl. Denies any change in medication or personal products, no new food items Evaluated by dermatology, no cause found.  Outpatient Medications Prior to Visit  Medication Sig Dispense Refill  . CALCIUM-VITAMIN D PO Take by mouth 1 day or 1 dose.    . cetirizine (ZYRTEC) 10 MG tablet Take 10 mg by mouth daily.      . clotrimazole-betamethasone (LOTRISONE) cream Apply 1 application topically 2 (two) times daily. 30 g 0  . diazepam (VALIUM) 5 MG tablet TAKE ONE TABLET BY MOUTH EVERY 12 HOURS AS NEEDED FOR ANXIETY 30 tablet 2  . famotidine (PEPCID) 20 MG tablet Take 1 tablet (20 mg total) by mouth 2 (two) times daily. 30 tablet 0  . hyoscyamine (LEVSIN SL) 0.125 MG SL tablet Place 1 tablet (0.125 mg total) under the tongue every 4 (four) hours as needed. 30 tablet 0  . levothyroxine (SYNTHROID, LEVOTHROID) 50 MCG tablet Take 50 mcg by mouth every other day.     . levothyroxine (SYNTHROID, LEVOTHROID) 50 MCG tablet TAKE ONE TABLET BY MOUTH EVERY OTHER DAY 45 tablet 3  . levothyroxine (SYNTHROID, LEVOTHROID) 75 MCG tablet TAKE ONE TABLET BY MOUTH EVERY OTHER DAY ALTERNATE  WITH  50MCG 45 tablet 3  . Multiple Minerals-Vitamins (CALCIUM & VIT D3 BONE HEALTH PO) Take by mouth. Reported on 09/09/2015    . OMEGA 3 1000 MG CAPS Take by mouth daily.      Marland Kitchen omeprazole (PRILOSEC) 20 MG capsule Take 1 capsule (20 mg total) by mouth daily. 90 capsule 3  . Probiotic Product (PROBIOTIC-10 PO) Take by mouth 1 day or 1 dose.    . Psyllium (METAMUCIL PO) Take by mouth every other day.    . Sennosides (SENOKOT PO) Take by mouth.    . simvastatin (ZOCOR) 20 MG tablet TAKE 1 TABLET BY MOUTH AT  BEDTIME. 90 tablet 0  . triamcinolone cream (KENALOG) 0.1 % Apply 1 application topically 2 (two) times daily. 30 g 0  . diphenhydramine-acetaminophen (TYLENOL PM) 25-500 MG TABS tablet Take 0.5 tablets by mouth at bedtime as needed.    . fluconazole (DIFLUCAN) 150 MG tablet Take 1 tablet (150 mg total) by mouth every 3 (three) days. 3 tablet 0  . predniSONE (DELTASONE) 10 MG tablet 4 tabs x 2 days, 3 tabs x 2 days, 2 tabs x 2 days, 1 tab x 2 days then stop (Patient not taking: Reported on 12/09/2016) 20 tablet 0  . sucralfate (CARAFATE) 1 g tablet Take 1 tablet (1 g total) by mouth 4 (four) times daily -  with meals and at bedtime. (Patient not taking: Reported on 12/09/2016) 60 tablet 0   No facility-administered medications prior to visit.     ROS See HPI  Objective:  BP 110/62   Pulse 71   Temp 97.6 F (36.4 C)   Ht 5\' 2"  (1.575 m)   Wt 111 lb (50.3 kg)   LMP 08/10/2013 Comment: post menopausal  SpO2 97%   BMI 20.30 kg/m   BP Readings from Last 3 Encounters:  12/09/16 110/62  11/12/16 117/77  08/31/16 130/70    Wt Readings from Last 3 Encounters:  12/09/16 111 lb (  50.3 kg)  11/12/16 112 lb (50.8 kg)  08/31/16 113 lb (51.3 kg)    Physical Exam  Constitutional: She is oriented to person, place, and time.  Cardiovascular: Normal rate.   Pulmonary/Chest: Effort normal. No respiratory distress.  Musculoskeletal: Normal range of motion.  Neurological: She is alert and oriented to person, place, and time.  Skin: Skin is warm and dry. No rash noted. No erythema.  Psychiatric: She has a normal mood and affect. Her behavior is normal. Thought content normal.  Vitals reviewed.   Lab Results  Component Value Date   WBC 7.7 11/12/2016   HGB 13.3 11/12/2016   HCT 40.3 11/12/2016   PLT 163 11/12/2016   GLUCOSE 87 11/12/2016   CHOL 239 (H) 08/31/2016   TRIG 109.0 08/31/2016   HDL 54.00 08/31/2016   LDLDIRECT 121.7 01/18/2013   LDLCALC 163 (H) 08/31/2016   ALT 14  11/12/2016   AST 23 11/12/2016   NA 138 11/12/2016   K 3.9 11/12/2016   CL 105 11/12/2016   CREATININE 0.59 11/12/2016   BUN 14 11/12/2016   CO2 27 11/12/2016   TSH 1.87 08/31/2016   INR 1.04 11/15/2012    Dg Chest 2 View  Result Date: 11/12/2016 CLINICAL DATA:  Chest pain for the past week. EXAM: CHEST  2 VIEW COMPARISON:  01/31/2015. FINDINGS: Normal sized heart. The lungs remain hyperexpanded. No significant change in scarring in the lingula and right middle lobe. Thoracic spine degenerative changes. Diffuse osteopenia. IMPRESSION: No acute abnormality. Electronically Signed   By: Claudie Revering M.D.   On: 11/12/2016 17:03    Assessment & Plan:   Maitlyn was seen today for follow-up.  Diagnoses and all orders for this visit:  Itching -     hydrOXYzine (ATARAX/VISTARIL) 10 MG tablet; Take 1 tablet (10 mg total) by mouth at bedtime as needed for itching.   I have discontinued Ms. Doukas's fluconazole, diphenhydramine-acetaminophen, predniSONE, and sucralfate. I am also having her start on hydrOXYzine. Additionally, I am having her maintain her Omega 3, cetirizine, Psyllium (METAMUCIL PO), Sennosides (SENOKOT PO), Multiple Minerals-Vitamins (CALCIUM & VIT D3 BONE HEALTH PO), hyoscyamine, Probiotic Product (PROBIOTIC-10 PO), CALCIUM-VITAMIN D PO, levothyroxine, omeprazole, triamcinolone cream, clotrimazole-betamethasone, levothyroxine, levothyroxine, diazepam, famotidine, simvastatin, and mupirocin ointment.  Meds ordered this encounter  Medications  . mupirocin ointment (BACTROBAN) 2 %  . hydrOXYzine (ATARAX/VISTARIL) 10 MG tablet    Sig: Take 1 tablet (10 mg total) by mouth at bedtime as needed for itching.    Dispense:  7 tablet    Refill:  0    Order Specific Question:   Supervising Provider    Answer:   Cassandria Anger [[id number]]    Follow-up: Return if symptoms worsen or fail to improve.  Wilfred Lacy, NP

## 2017-02-02 DIAGNOSIS — H66002 Acute suppurative otitis media without spontaneous rupture of ear drum, left ear: Secondary | ICD-10-CM | POA: Diagnosis not present

## 2017-02-02 DIAGNOSIS — H6982 Other specified disorders of Eustachian tube, left ear: Secondary | ICD-10-CM | POA: Diagnosis not present

## 2017-02-05 DIAGNOSIS — H6690 Otitis media, unspecified, unspecified ear: Secondary | ICD-10-CM | POA: Diagnosis not present

## 2017-02-18 ENCOUNTER — Other Ambulatory Visit: Payer: Self-pay | Admitting: Family

## 2017-02-24 ENCOUNTER — Other Ambulatory Visit: Payer: Self-pay | Admitting: Family

## 2017-03-02 DIAGNOSIS — Z23 Encounter for immunization: Secondary | ICD-10-CM | POA: Diagnosis not present

## 2017-05-04 ENCOUNTER — Ambulatory Visit (INDEPENDENT_AMBULATORY_CARE_PROVIDER_SITE_OTHER): Payer: Medicare Other | Admitting: Family Medicine

## 2017-05-04 ENCOUNTER — Encounter: Payer: Self-pay | Admitting: Family Medicine

## 2017-05-04 ENCOUNTER — Ambulatory Visit: Payer: Medicare Other | Admitting: Internal Medicine

## 2017-05-04 VITALS — BP 118/70 | HR 79 | Temp 98.4°F | Ht 62.0 in | Wt 112.0 lb

## 2017-05-04 DIAGNOSIS — R300 Dysuria: Secondary | ICD-10-CM

## 2017-05-04 LAB — POCT URINALYSIS DIPSTICK
Bilirubin, UA: NEGATIVE
GLUCOSE UA: NEGATIVE
KETONES UA: NEGATIVE
NITRITE UA: NEGATIVE
PH UA: 6 (ref 5.0–8.0)
PROTEIN UA: 30
SPEC GRAV UA: 1.02 (ref 1.010–1.025)
Urobilinogen, UA: 0.2 E.U./dL

## 2017-05-04 MED ORDER — CEPHALEXIN 500 MG PO CAPS: 500.0000 mg | ORAL_CAPSULE | Freq: Two times a day (BID) | ORAL | 0 refills | Status: AC

## 2017-05-04 NOTE — Progress Notes (Signed)
   Subjective:  Marilyn Blake is a 76 y.o. female who presents today with a chief complaint of dysuria.   HPI:  Dysuria, acute issue Symptoms started a few days ago with urgency and frequency. Worsened last night and developed dysuria.  Notes of been stable over the last several hours.  Symptoms currently feel like her past urinary tract infections.  She tried taking Azo which helped a little bit.  No nausea or vomiting.  No fevers or chills.  No back pain.  She does have a little bit of lower abdominal pain.  ROS: Per HPI  PMH: Significant for UTI diagnosed last year.   Objective:  Physical Exam: BP 118/70 (BP Location: Left Arm, Patient Position: Sitting, Cuff Size: Normal)   Pulse 79   Temp 98.4 F (36.9 C) (Oral)   Ht 5\' 2"  (1.575 m)   Wt 112 lb (50.8 kg)   LMP 08/10/2013 Comment: post menopausal  SpO2 95%   BMI 20.49 kg/m   Gen: NAD, resting comfortably CV: RRR with no murmurs appreciated Pulm: NWOB, CTAB with no crackles, wheezes, or rhonchi GI: Mild suprapubic tenderness.  Bowel sounds present and normal. MSK: No CVA tenderness  Results for orders placed or performed in visit on 05/04/17 (from the past 72 hour(s))  POCT urinalysis dipstick     Status: Abnormal   Collection Time: 05/04/17  1:06 PM  Result Value Ref Range   Color, UA Yellow    Clarity, UA Cloudy    Glucose, UA Negative    Bilirubin, UA Negative    Ketones, UA Negative    Spec Grav, UA 1.020 1.010 - 1.025   Blood, UA 3+     Comment: 200 Ery/uL   pH, UA 6.0 5.0 - 8.0   Protein, UA 30    Urobilinogen, UA 0.2 0.2 or 1.0 E.U./dL   Nitrite, UA Negative    Leukocytes, UA Large (3+) (A) Negative   Appearance     Odor     Assessment/Plan:  Dysuria Symptoms and UA consistent with UTI.  No signs of systemic illness.  Start Keflex 500 mg twice daily for 7-day course.  Check urine culture.  Return precautions reviewed.  Follow-up as needed.  Algis Greenhouse. Jerline Pain, MD 05/04/2017 1:15 PM

## 2017-05-04 NOTE — Patient Instructions (Signed)
Start the keflex.  We will let you know if we need to change antibiotics.  Let me know if not getting better.  Take care,  Dr Jerline Pain.

## 2017-05-05 DIAGNOSIS — H5213 Myopia, bilateral: Secondary | ICD-10-CM | POA: Diagnosis not present

## 2017-05-05 DIAGNOSIS — H2513 Age-related nuclear cataract, bilateral: Secondary | ICD-10-CM | POA: Diagnosis not present

## 2017-05-05 DIAGNOSIS — H52223 Regular astigmatism, bilateral: Secondary | ICD-10-CM | POA: Diagnosis not present

## 2017-05-07 LAB — URINE CULTURE
MICRO NUMBER: 81421824
SPECIMEN QUALITY: ADEQUATE

## 2017-05-07 NOTE — Progress Notes (Signed)
Culture confirms UTI. She is on the appropriate antibiotic. She should come back if symptoms persist.  Algis Greenhouse. Jerline Pain, MD 05/07/2017 4:50 PM

## 2017-05-10 ENCOUNTER — Telehealth: Payer: Self-pay | Admitting: Internal Medicine

## 2017-05-10 MED ORDER — HYDROCORTISONE 2.5 % RE CREA: 1.0000 "application " | TOPICAL_CREAM | Freq: Two times a day (BID) | RECTAL | 0 refills | Status: DC

## 2017-05-10 NOTE — Telephone Encounter (Signed)
LVM informing patient.

## 2017-05-10 NOTE — Telephone Encounter (Signed)
Copied from [place] [id number]. Topic: Quick Communication - Lab Results >> May 10, 2017  9:20 AM Jasper Loser, CMA wrote: Called patient to inform them of 05/04/17 lab results. When patient returns call, triage nurse may disclose results.  >> May 10, 2017 11:02 AM Percell Belt A wrote: Pt called and is aware of results but she has some question about a cream she could use for the fisher she thinks she has gotten in her rectum.  It there a cream that she can use to help this?

## 2017-05-10 NOTE — Telephone Encounter (Signed)
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

## 2017-05-19 ENCOUNTER — Ambulatory Visit: Payer: Medicare Other | Admitting: Gastroenterology

## 2017-06-04 ENCOUNTER — Other Ambulatory Visit: Payer: Self-pay | Admitting: Internal Medicine

## 2017-06-04 ENCOUNTER — Ambulatory Visit (INDEPENDENT_AMBULATORY_CARE_PROVIDER_SITE_OTHER): Payer: Medicare Other | Admitting: Internal Medicine

## 2017-06-04 ENCOUNTER — Encounter: Payer: Self-pay | Admitting: Internal Medicine

## 2017-06-04 DIAGNOSIS — Z1231 Encounter for screening mammogram for malignant neoplasm of breast: Secondary | ICD-10-CM

## 2017-06-04 DIAGNOSIS — R102 Pelvic and perineal pain: Secondary | ICD-10-CM

## 2017-06-04 LAB — POCT URINALYSIS DIPSTICK
BILIRUBIN UA: NEGATIVE
GLUCOSE UA: NEGATIVE
Ketones, UA: NEGATIVE
LEUKOCYTES UA: NEGATIVE
Nitrite, UA: NEGATIVE
Protein, UA: NEGATIVE
RBC UA: NEGATIVE
SPEC GRAV UA: 1.015 (ref 1.010–1.025)
Urobilinogen, UA: 0.2 E.U./dL
pH, UA: 6.5 (ref 5.0–8.0)

## 2017-06-04 MED ORDER — HYDROCORTISONE 2.5 % RE CREA: 1.0000 "application " | TOPICAL_CREAM | Freq: Two times a day (BID) | RECTAL | 11 refills | Status: DC

## 2017-06-04 MED ORDER — SIMVASTATIN 20 MG PO TABS: 20.0000 mg | ORAL_TABLET | Freq: Every day | ORAL | 3 refills | Status: DC

## 2017-06-04 MED ORDER — OMEPRAZOLE 20 MG PO CPDR: 20.0000 mg | DELAYED_RELEASE_CAPSULE | Freq: Every day | ORAL | 3 refills | Status: DC

## 2017-06-04 MED ORDER — PHENAZOPYRIDINE HCL 100 MG PO TABS: 100.0000 mg | ORAL_TABLET | Freq: Three times a day (TID) | ORAL | 0 refills | Status: DC | PRN

## 2017-06-04 MED ORDER — DIAZEPAM 5 MG PO TABS: ORAL_TABLET | ORAL | 2 refills | Status: DC

## 2017-06-04 NOTE — Progress Notes (Signed)
   Subjective:    Patient ID: Marilyn Blake, female    DOB: [birth year], 77 y.o.   MRN: [id number]  HPI The patient is a 77 YO female coming in for pressure in her lower abdomen and pelvis. This feels like a recent uti she had but not as bad. She is taking azo over the counter and done a sitz bath which helped some. She was just treated in December with keflex. She is also wondering if we can resend the medicine for her anal fissure as the pharmacy never received it. It is not doing bad lately but she wants to have some on hand.   Review of Systems  Constitutional: Negative.   Respiratory: Negative for cough, chest tightness and shortness of breath.   Cardiovascular: Negative for chest pain, palpitations and leg swelling.  Gastrointestinal: Negative for abdominal distention, abdominal pain, constipation, diarrhea, nausea and vomiting.  Genitourinary: Positive for frequency, pelvic pain and urgency. Negative for decreased urine volume, difficulty urinating, dysuria, genital sores, hematuria, menstrual problem, vaginal bleeding, vaginal discharge and vaginal pain.  Musculoskeletal: Negative.   Skin: Negative.   Neurological: Negative.   Psychiatric/Behavioral: Negative.       Objective:   Physical Exam  Constitutional: She is oriented to person, place, and time. She appears well-developed and well-nourished.  HENT:  Head: Normocephalic and atraumatic.  Eyes: EOM are normal.  Neck: Normal range of motion.  Cardiovascular: Normal rate and regular rhythm.  Pulmonary/Chest: Effort normal and breath sounds normal. No respiratory distress. She has no wheezes. She has no rales.  Abdominal: Soft. Bowel sounds are normal. She exhibits no distension. There is no tenderness. There is no rebound.  Genitourinary:  Genitourinary Comments: Declines internal exam  Musculoskeletal: She exhibits no edema.  Neurological: She is alert and oriented to person, place, and time. Coordination normal.  Skin: Skin  is warm and dry.  Psychiatric: She has a normal mood and affect.   Vitals:   06/04/17 1040  BP: 110/78  Pulse: 66  Temp: 97.7 F (36.5 C)  TempSrc: Oral  SpO2: 99%  Weight: 114 lb (51.7 kg)  Height: 5\' 2"  (1.575 m)      Assessment & Plan:

## 2017-06-04 NOTE — Assessment & Plan Note (Signed)
Checked U/A in the office today without signs of infection. Rx for pyridium today. She is advised to return to gyn as she has had prolapse in the past and not having now. Prior hysterectomy and some kind of surgical bladder procedure done >5-10 years ago.

## 2017-06-04 NOTE — Patient Instructions (Addendum)
You do not have a bladder infection. We have sent in pyridium which you can try for the discomfort.   We will monitor this and if it is still bothering you we can look into evaluation of the bladder more.   We have sent in the cream to use.

## 2017-06-08 ENCOUNTER — Ambulatory Visit: Payer: Medicare Other | Admitting: Gastroenterology

## 2017-06-14 ENCOUNTER — Ambulatory Visit: Payer: Medicare Other | Admitting: Internal Medicine

## 2017-06-16 ENCOUNTER — Ambulatory Visit: Payer: Medicare Other

## 2017-06-16 NOTE — Progress Notes (Signed)
Subjective:   Marilyn Blake is a 77 y.o. female who presents for Medicare Annual (Subsequent) preventive examination.  Review of Systems:  No ROS.  Medicare Wellness Visit. Additional risk factors are reflected in the social history.  Cardiac Risk Factors include: advanced age (>70mn, >>12women);dyslipidemia Sleep patterns: gets up 1 times nightly to void and sleeps 7 hours nightly.    Home Safety/Smoke Alarms: Feels safe in home. Smoke alarms in place.  Living environment; residence and Firearm Safety: 2-story house, no firearms. Lives with husband, no needs for DME, good support system Seat Belt Safety/Bike Helmet: Wears seat belt.     Objective:     Vitals: BP 98/60 (BP Location: Left Arm, Patient Position: Sitting, Cuff Size: Normal)   Pulse 73   Resp 20   Ht 5' 2"  (1.575 m)   Wt 117 lb (53.1 kg)   LMP 08/10/2013 Comment: post menopausal  SpO2 98%   BMI 21.40 kg/m   Body mass index is 21.4 kg/m.  Advanced Directives 06/17/2017 11/12/2016 06/16/2016 12/07/2014 07/08/2012  Does Patient Have a Medical Advance Directive? No No No No Patient does not have advance directive  Would patient like information on creating a medical advance directive? Yes (ED - Information included in AVS) No - Patient declined Yes (MAU/Ambulatory/Procedural Areas - Information given) - -    Tobacco Social History   Tobacco Use  Smoking Status Never Smoker  Smokeless Tobacco Never Used  Tobacco Comment   Married, Housewife     Counseling given: Not Answered Comment: Married, Housewife  Past Medical History:  Diagnosis Date  . ALLERGIC RHINITIS   . ANXIETY   . Arthritis   . BACTEREMIA, MYCOBACTERIUM AVIUM COMPLEX   . BRONCHIECTASIS   . COLONIC POLYPS, HX OF   . Fundic gland polyps of stomach, benign   . GERD   . GOUT   . Headache   . HYPERLIPIDEMIA   . HYPOTHYROIDISM   . Irritable bowel syndrome   . OSTEOPENIA    Past Surgical History:  Procedure Laterality Date  . ABDOMINAL  HYSTERECTOMY    . BLADDER SUSPENSION    . CARPAL TUNNEL RELEASE Left   . COLONOSCOPY    . MASS EXCISION Right 08/10/2013   Procedure: DEBRIDE DISTAL INTERPHALANGEAL JOINT/EXCISION MUCOID CYST RIGHT LONG FINGER and right small finger;  Surgeon: RCammie Sickle, MD;  [place] [facility]  Service: Orthopedics;  Laterality: Right;  . THYROIDECTOMY     Hurthele cell tumor  . UPPER GASTROINTESTINAL ENDOSCOPY     Family History  Problem Relation Age of Onset  . Cervical cancer Mother 813       died age 78 . Heart attack Father 853 . Colon cancer Neg Hx   . Esophageal cancer Neg Hx   . Stomach cancer Neg Hx   . Rectal cancer Neg Hx    Social History   Socioeconomic History  . Marital status: Married    Spouse name: None  . Number of children: 3  . Years of education: None  . Highest education level: None  Social Needs  . Financial resource strain: Not hard at all  . Food insecurity - worry: Never true  . Food insecurity - inability: Never true  . Transportation needs - medical: No  . Transportation needs - non-medical: No  Occupational History  . Occupation: Retired    [employer] farm manager RETIRED  Tobacco Use  . Smoking status: Never Smoker  . Smokeless tobacco: Never  Used  . Tobacco comment: Married, Housewife  Substance and Sexual Activity  . Alcohol use: No    Alcohol/week: 0.0 oz  . Drug use: No  . Sexual activity: No  Other Topics Concern  . None  Social History Narrative  . None    Outpatient Encounter Medications as of 06/17/2017  Medication Sig  . CALCIUM-VITAMIN D PO Take by mouth 1 day or 1 dose.  . clotrimazole-betamethasone (LOTRISONE) cream Apply 1 application topically 2 (two) times daily.  . diazepam (VALIUM) 5 MG tablet TAKE ONE TABLET BY MOUTH EVERY 12 HOURS AS NEEDED FOR ANXIETY (Patient taking differently: 5 mg every 12 (twelve) hours as needed for anxiety (patkes 1/2 tablet). TAKE ONE TABLET BY MOUTH EVERY 12 HOURS AS NEEDED FOR ANXIETY)    . hydrocortisone (ANUSOL-HC) 2.5 % rectal cream Place 1 application rectally 2 (two) times daily.  . hydrOXYzine (ATARAX/VISTARIL) 10 MG tablet Take 1 tablet (10 mg total) by mouth at bedtime as needed for itching.  . levothyroxine (SYNTHROID, LEVOTHROID) 50 MCG tablet TAKE ONE TABLET BY MOUTH EVERY OTHER DAY  . loratadine (CLARITIN) 10 MG tablet Take 10 mg by mouth daily.  . Multiple Minerals-Vitamins (CALCIUM & VIT D3 BONE HEALTH PO) Take by mouth. Reported on 09/09/2015  . OMEGA 3 1000 MG CAPS Take by mouth daily.    Marland Kitchen omeprazole (PRILOSEC) 20 MG capsule Take 1 capsule (20 mg total) by mouth daily.  . Probiotic Product (PROBIOTIC-10 PO) Take by mouth 1 day or 1 dose.  . Psyllium (METAMUCIL PO) Take by mouth every other day.  . Sennosides (SENOKOT PO) Take by mouth.  . simvastatin (ZOCOR) 20 MG tablet Take 1 tablet (20 mg total) by mouth at bedtime.  . [DISCONTINUED] cetirizine (ZYRTEC) 10 MG tablet Take 10 mg by mouth daily.    . [DISCONTINUED] mupirocin ointment (BACTROBAN) 2 %   . [DISCONTINUED] phenazopyridine (PYRIDIUM) 100 MG tablet Take 1 tablet (100 mg total) by mouth 3 (three) times daily as needed for pain. (Patient not taking: Reported on 06/17/2017)  . [DISCONTINUED] triamcinolone cream (KENALOG) 0.1 % Apply 1 application topically 2 (two) times daily. (Patient not taking: Reported on 06/17/2017)   No facility-administered encounter medications on file as of 06/17/2017.     Activities of Daily Living In your present state of health, do you have any difficulty performing the following activities: 06/17/2017  Hearing? N  Vision? N  Difficulty concentrating or making decisions? N  Walking or climbing stairs? N  Dressing or bathing? N  Doing errands, shopping? N  Preparing Food and eating ? N  Using the Toilet? N  In the past six months, have you accidently leaked urine? Y  Do you have problems with loss of bowel control? N  Managing your Medications? N  Managing your Finances?  N  Housekeeping or managing your Housekeeping? N  Some recent data might be hidden    Patient Care Team: Hoyt Koch, MD as PCP - General (Internal Medicine) Deneise Lever, MD (Pulmonary Disease) Gatha Mayer, MD (Gastroenterology) Lenard Simmer (Dentistry)    Assessment:   This is a routine wellness examination for Farah. Physical assessment deferred to PCP.   Exercise Activities and Dietary recommendations Current Exercise Habits: Home exercise routine, Type of exercise: stretching;exercise ball;walking(Stationary bike), Time (Minutes): 40, Frequency (Times/Week): 4, Weekly Exercise (Minutes/Week): 160, Intensity: Mild, Exercise limited by: None identified  Diet (meal preparation, eat out, water intake, caffeinated beverages, dairy products, fruits and vegetables): in general, a "  healthy" diet  , well balanced, eats a variety of fruits and vegetables daily, limits salt, fat/cholesterol, sugar, caffeine, drinks 6-8 glasses of water daily.  Goals    . Patient Stated     I would like to travel to [place] more often to visit my family. Continue to exercise, eat healthy, stay as active and as independent as possible.       Fall Risk Fall Risk  06/17/2017 06/16/2016 02/28/2016 05/21/2015 02/27/2015  Falls in the past year? No No No No Yes  Number falls in past yr: - - - - 1  Injury with Fall? - - - - Yes  Comment - - - - left hip bruise, did not go to ed  Risk Factor Category  - - - - High Fall Risk  Risk for fall due to : - - - - (No Data)  Risk for fall due to: Comment - - - - floor was slippery   Depression Screen PHQ 2/9 Scores 06/17/2017 06/16/2016 02/28/2016 02/27/2015  PHQ - 2 Score 2 0 0 0  PHQ- 9 Score 3 - - -     Cognitive Function MMSE - Mini Mental State Exam 06/17/2017  Orientation to time 5  Orientation to Place 5  Registration 3  Attention/ Calculation 5  Recall 2  Language- name 2 objects 2  Language- repeat 1  Language- follow 3 step  command 3  Language- read & follow direction 1  Write a sentence 1  Copy design 1  Total score 29        Immunization History  Administered Date(s) Administered  . Influenza Split 02/03/2011, 02/15/2012  . Influenza Whole 02/16/2008, 03/25/2009, 02/03/2010  . Influenza, High Dose Seasonal PF 02/11/2016  . Influenza,inj,Quad PF,6+ Mos 01/18/2013, 03/15/2014, 01/31/2015  . Influenza-Unspecified 01/31/2015, 04/01/2017  . Pneumococcal Conjugate-13 03/27/2014  . Pneumococcal Polysaccharide-23 08/14/2008  . Td 03/27/2009    Screening Tests Health Maintenance  Topic Date Due  . TETANUS/TDAP  03/28/2019  . INFLUENZA VACCINE  Completed  . DEXA SCAN  Completed  . PNA vac Low Risk Adult  Completed      Plan:     Hip stretching and range of motion exercises attached to patient's AVS to help with hip pain and stiffness.  Continue doing brain stimulating activities (puzzles, reading, adult coloring books, staying active) to keep memory sharp.   Continue to eat heart healthy diet (full of fruits, vegetables, whole grains, lean protein, water--limit salt, fat, and sugar intake) and increase physical activity as tolerated.  I have personally reviewed and noted the following in the patient's chart:   . Medical and social history . Use of alcohol, tobacco or illicit drugs  . Current medications and supplements . Functional ability and status . Nutritional status . Physical activity . Advanced directives . List of other physicians . Vitals . Screenings to include cognitive, depression, and falls . Referrals and appointments  In addition, I have reviewed and discussed with patient certain preventive protocols, quality metrics, and best practice recommendations. A written personalized care plan for preventive services as well as general preventive health recommendations were provided to patient.     Michiel Cowboy, RN  06/17/2017

## 2017-06-17 ENCOUNTER — Ambulatory Visit (INDEPENDENT_AMBULATORY_CARE_PROVIDER_SITE_OTHER): Payer: Medicare Other | Admitting: *Deleted

## 2017-06-17 VITALS — BP 98/60 | HR 73 | Resp 20 | Ht 62.0 in | Wt 117.0 lb

## 2017-06-17 DIAGNOSIS — Z0000 Encounter for general adult medical examination without abnormal findings: Secondary | ICD-10-CM | POA: Diagnosis not present

## 2017-06-17 NOTE — Patient Instructions (Addendum)
Continue doing brain stimulating activities (puzzles, reading, adult coloring books, staying active) to keep memory sharp.   Continue to eat heart healthy diet (full of fruits, vegetables, whole grains, lean protein, water--limit salt, fat, and sugar intake) and increase physical activity as tolerated.   Marilyn Blake , Thank you for taking time to come for your Medicare Wellness Visit. I appreciate your ongoing commitment to your health goals. Please review the following plan we discussed and let me know if I can assist you in the future.   These are the goals we discussed: Goals    . Patient Stated     I would like to travel to [place] more often to visit my family. Continue to exercise, eat healthy, stay as active and as independent as possible.       This is a list of the screening recommended for you and due dates:  Health Maintenance  Topic Date Due  . Tetanus Vaccine  03/28/2019  . Flu Shot  Completed  . DEXA scan (bone density measurement)  Completed  . Pneumonia vaccines  Completed    Hip Exercises Ask your health care provider which exercises are safe for you. Do exercises exactly as told by your health care provider and adjust them as directed. It is normal to feel mild stretching, pulling, tightness, or discomfort as you do these exercises, but you should stop right away if you feel sudden pain or your pain gets worse.Do not begin these exercises until told by your health care provider. STRETCHING AND RANGE OF MOTION EXERCISES These exercises warm up your muscles and joints and improve the movement and flexibility of your hip. These exercises also help to relieve pain, numbness, and tingling. Exercise A: Hamstrings, Supine  1. Lie on your back. 2. Loop a belt or towel over the ball of your left / rightfoot. The ball of your foot is on the walking surface, right under your toes. 3. Straighten your left / rightknee and slowly pull on the belt to raise your  leg. ? Do not let your left / right knee bend while you do this. ? Keep your other leg flat on the floor. ? Raise the left / right leg until you feel a gentle stretch behind your left / right knee or thigh. 4. Hold this position for __________ seconds. 5. Slowly return your leg to the starting position. Repeat __________ times. Complete this stretch __________ times a day. Exercise B: Hip Rotators  1. Lie on your back on a firm surface. 2. Hold your left / right knee with your left / right hand. Hold your ankle with your other hand. 3. Gently pull your left / right knee and rotate your lower leg toward your other shoulder. ? Pull until you feel a stretch in your buttocks. ? Keep your hips and shoulders firmly planted while you do this stretch. 4. Hold this position for __________ seconds. Repeat __________ times. Complete this stretch __________ times a day. Exercise C: V-Sit (Hamstrings and Adductors)  1. Sit on the floor with your legs extended in a large "V" shape. Keep your knees straight during this exercise. 2. Start with your head and chest upright, then bend at your waist to reach for your left foot (position A). You should feel a stretch in your right inner thigh. 3. Hold this position for __________ seconds. Then slowly return to the upright position. 4. Bend at your waist to reach forward (position B). You should feel a stretch behind  both of your thighs and knees. 5. Hold this position for __________ seconds. Then slowly return to the upright position. 6. Bend at your waist to reach for your right foot (position C). You should feel a stretch in your left inner thigh. 7. Hold this position for __________ seconds. Then slowly return to the upright position. Repeat __________ times. Complete this stretch __________ times a day. Exercise D: Lunge (Hip Flexors)  1. Place your left / right knee on the floor and bend your other knee so that is directly over your ankle. You should be  half-kneeling. 2. Keep good posture with your head over your shoulders. 3. Tighten your buttocks to point your tailbone downward. This helps your back to keep from arching too much. 4. You should feel a gentle stretch in the front of your left / right thigh and hip. If you do not feel any resistance, slightly slide your other foot forward and then slowly lunge forward so your knee once again lines up over your ankle. 5. Make sure your tailbone continues to point downward. 6. Hold this position for __________ seconds. Repeat __________ times. Complete this stretch __________ times a day. STRENGTHENING EXERCISES These exercises build strength and endurance in your hip. Endurance is the ability to use your muscles for a long time, even after they get tired. Exercise E: Bridge (Hip Extensors)  1. Lie on your back on a firm surface with your knees bent and your feet flat on the floor. 2. Tighten your buttocks muscles and lift your bottom off the floor until the trunk of your body is level with your thighs. ? Do not arch your back. ? You should feel the muscles working in your buttocks and the back of your thighs. If you do not feel these muscles, slide your feet 1-2 inches (2.5-5 cm) farther away from your buttocks. 3. Hold this position for __________ seconds. 4. Slowly lower your hips to the starting position. 5. Let your muscles relax completely between repetitions. 6. If this exercise is too easy, try doing it with your arms crossed over your chest. Repeat __________ times. Complete this exercise __________ times a day. Exercise F: Straight Leg Raises - Hip Abductors  1. Lie on your side with your left / right leg in the top position. Lie so your head, shoulder, knee, and hip line up with each other. You may bend your bottom knee to help you balance. 2. Roll your hips slightly forward, so your hips are stacked directly over each other and your left / right knee is facing forward. 3. Leading  with your heel, lift your top leg 4-6 inches (10-15 cm). You should feel the muscles in your outer hip lifting. ? Do not let your foot drift forward. ? Do not let your knee roll toward the ceiling. 4. Hold this position for __________ seconds. 5. Slowly return to the starting position. 6. Let your muscles relax completely between repetitions. Repeat __________ times. Complete this exercise __________ times a day. Exercise G: Straight Leg Raises - Hip Adductors  1. Lie on your side with your left / right leg in the bottom position. Lie so your head, shoulder, knee, and hip line up. You may place your upper foot in front to help you balance. 2. Roll your hips slightly forward, so your hips are stacked directly over each other and your left / right knee is facing forward. 3. Tense the muscles in your inner thigh and lift your bottom leg 4-6 inches (  10-15 cm). 4. Hold this position for __________ seconds. 5. Slowly return to the starting position. 6. Let your muscles relax completely between repetitions. Repeat __________ times. Complete this exercise __________ times a day. Exercise H: Straight Leg Raises - Quadriceps  1. Lie on your back with your left / right leg extended and your other knee bent. 2. Tense the muscles in the front of your left / right thigh. When you do this, you should see your kneecap slide up or see increased dimpling just above your knee. 3. Tighten these muscles even more and raise your leg 4-6 inches (10-15 cm) off the floor. 4. Hold this position for __________ seconds. 5. Keep these muscles tense as you lower your leg. 6. Relax the muscles slowly and completely between repetitions. Repeat __________ times. Complete this exercise __________ times a day. Exercise I: Hip Abductors, Standing 1. Tie one end of a rubber exercise band or tubing to a secure surface, such as a table or pole. 2. Loop the other end of the band or tubing around your left / right  ankle. 3. Keeping your ankle with the band or tubing directly opposite of the secured end, step away until there is tension in the tubing or band. Hold onto a chair as needed for balance. 4. Lift your left / right leg out to your side. While you do this: ? Keep your back upright. ? Keep your shoulders over your hips. ? Keep your toes pointing forward. ? Make sure to use your hip muscles to lift your leg. Do not "throw" your leg or tip your body to lift your leg. 5. Hold this position for __________ seconds. 6. Slowly return to the starting position. Repeat __________ times. Complete this exercise __________ times a day. Exercise J: Squats (Quadriceps) 1. Stand in a door frame so your feet and knees are in line with the frame. You may place your hands on the frame for balance. 2. Slowly bend your knees and lower your hips like you are going to sit in a chair. ? Keep your lower legs in a straight-up-and-down position. ? Do not let your hips go lower than your knees. ? Do not bend your knees lower than told by your health care provider. ? If your hip pain increases, do not bend as low. 3. Hold this position for ___________ seconds. 4. Slowly push with your legs to return to standing. Do not use your hands to pull yourself to standing. Repeat __________ times. Complete this exercise __________ times a day. This information is not intended to replace advice given to you by your health care provider. Make sure you discuss any questions you have with your health care provider. Document Released: 05/22/2005 Document Revised: 01/27/2016 Document Reviewed: 04/29/2015 Elsevier Interactive Patient Education  [employer]

## 2017-06-18 NOTE — Progress Notes (Signed)
Medical treatment/procedure(s) were performed by non-physician practitioner and as supervising physician I was immediately available for consultation/collaboration. I agree with above. Elizabeth A Crawford, MD  

## 2017-06-24 DIAGNOSIS — H2512 Age-related nuclear cataract, left eye: Secondary | ICD-10-CM | POA: Diagnosis not present

## 2017-06-24 DIAGNOSIS — H2513 Age-related nuclear cataract, bilateral: Secondary | ICD-10-CM | POA: Diagnosis not present

## 2017-06-24 DIAGNOSIS — H25012 Cortical age-related cataract, left eye: Secondary | ICD-10-CM | POA: Diagnosis not present

## 2017-06-24 DIAGNOSIS — H35363 Drusen (degenerative) of macula, bilateral: Secondary | ICD-10-CM | POA: Diagnosis not present

## 2017-06-24 DIAGNOSIS — H25013 Cortical age-related cataract, bilateral: Secondary | ICD-10-CM | POA: Diagnosis not present

## 2017-06-24 DIAGNOSIS — H04123 Dry eye syndrome of bilateral lacrimal glands: Secondary | ICD-10-CM | POA: Diagnosis not present

## 2017-07-05 ENCOUNTER — Ambulatory Visit
Admission: RE | Admit: 2017-07-05 | Discharge: 2017-07-05 | Disposition: A | Discharge status: Home / Self Care | Payer: Medicare Other | Source: Ambulatory Visit | Attending: Internal Medicine | Admitting: Internal Medicine

## 2017-07-05 DIAGNOSIS — Z1231 Encounter for screening mammogram for malignant neoplasm of breast: Secondary | ICD-10-CM | POA: Diagnosis not present

## 2017-07-20 DIAGNOSIS — H25812 Combined forms of age-related cataract, left eye: Secondary | ICD-10-CM | POA: Diagnosis not present

## 2017-07-20 DIAGNOSIS — H2512 Age-related nuclear cataract, left eye: Secondary | ICD-10-CM | POA: Diagnosis not present

## 2017-08-11 DIAGNOSIS — H2511 Age-related nuclear cataract, right eye: Secondary | ICD-10-CM | POA: Diagnosis not present

## 2017-08-11 DIAGNOSIS — H25011 Cortical age-related cataract, right eye: Secondary | ICD-10-CM | POA: Diagnosis not present

## 2017-08-17 DIAGNOSIS — H2511 Age-related nuclear cataract, right eye: Secondary | ICD-10-CM | POA: Diagnosis not present

## 2017-08-17 DIAGNOSIS — H25811 Combined forms of age-related cataract, right eye: Secondary | ICD-10-CM | POA: Diagnosis not present

## 2017-08-27 ENCOUNTER — Other Ambulatory Visit: Payer: Self-pay | Admitting: Internal Medicine

## 2017-09-16 ENCOUNTER — Ambulatory Visit (INDEPENDENT_AMBULATORY_CARE_PROVIDER_SITE_OTHER): Payer: Medicare Other | Admitting: Internal Medicine

## 2017-09-16 ENCOUNTER — Encounter: Payer: Self-pay | Admitting: Internal Medicine

## 2017-09-16 ENCOUNTER — Ambulatory Visit (INDEPENDENT_AMBULATORY_CARE_PROVIDER_SITE_OTHER)
Admission: RE | Admit: 2017-09-16 | Discharge: 2017-09-16 | Disposition: A | Discharge status: Home / Self Care | Payer: Medicare Other | Source: Ambulatory Visit | Attending: Internal Medicine | Admitting: Internal Medicine

## 2017-09-16 VITALS — BP 120/78 | HR 82 | Ht 63.0 in | Wt 111.4 lb

## 2017-09-16 DIAGNOSIS — J45901 Unspecified asthma with (acute) exacerbation: Secondary | ICD-10-CM

## 2017-09-16 DIAGNOSIS — K219 Gastro-esophageal reflux disease without esophagitis: Secondary | ICD-10-CM

## 2017-09-16 DIAGNOSIS — R05 Cough: Secondary | ICD-10-CM | POA: Diagnosis not present

## 2017-09-16 DIAGNOSIS — J441 Chronic obstructive pulmonary disease with (acute) exacerbation: Secondary | ICD-10-CM | POA: Diagnosis not present

## 2017-09-16 MED ORDER — METHYLPREDNISOLONE ACETATE 80 MG/ML IJ SUSP
80.0000 mg | Freq: Once | INTRAMUSCULAR | Status: AC
  Administered 2017-09-16: 80 mg via INTRAMUSCULAR

## 2017-09-16 MED ORDER — LEVALBUTEROL HCL 0.63 MG/3ML IN NEBU
0.6300 mg | INHALATION_SOLUTION | Freq: Once | RESPIRATORY_TRACT | Status: AC
  Administered 2017-09-16: 0.63 mg via RESPIRATORY_TRACT

## 2017-09-16 NOTE — Progress Notes (Signed)
Patient ID: Marilyn Blake, female    DOB: 07-26-1940, 77 y.o.   MRN: [id number]  HPI 02/03/2011-77 year old female never smoker followed for bronchiectasis, history of MAIC bronchitis, allergic rhinitis.   --------------------------------------------------------------------------------------  02/11/2016-77 year old female never smoker followed for bronchiectasis, recurrent MAIC bronchitis, allergic rhinitis, insomnia, complicated by hypothyroid, IBS FOLLOWS FOR: Pt states she continues to have wheezing at times but otherwise doing well; has recently been on several abx's for bladder infection. Completed yesterday. No recent lung exacerbation. Occasionally cough is productive of white or yellow with no blood. No wheeze and no inhaler. Up-to-date on pneumonia vaccine. History of the small local rash on her arm after taking Avelox. She says the doctor then thought she just had an insect bite and not an allergic reaction to Avelox, so she asked me to correct the allergy list in her chart.  09/16/2017- 77 year old female never smoker followed for bronchiectasis, recurrent MAIC bronchitis, allergic rhinitis, insomnia, complicated by hypothyroid, IBS ----Bronchitis: Pt states she wears mask when outdoors but notes-vertigo, weakness, and wheezing that is getting worse.  In the last few weeks with heavy pollen levels she has been noticing increased chest congestion, shortness of breath and some wheeze with dry cough.  Denies sore throat, fever or sick exposure. She asks about CXR. CXR 11/12/2016- Normal sized heart. The lungs remain hyperexpanded. No significant change in scarring in the lingula and right middle lobe. Thoracic spine degenerative changes. Diffuse osteopenia.  Review of Systems- see HPI Constitutional:   No-   weight loss, +night sweats, , chills, +fatigue, lassitude. HEENT:   +  headaches, difficulty swallowing, tooth/dental problems, sore throat,       No-  sneezing, itching, ear ache,  +nasal congestion, post nasal drip,  CV:  atypicalchest pain, orthopnea, PND, swelling in lower extremities, anasarca,dizziness, palpitations Resp: + shortness of breath with exertion or at rest.             + productive cough,   non-productive cough,  no- coughing up of blood.              No- change in color of mucus.  No- wheezing.   Skin: No-   rash or lesions. GI:  No-   heartburn, indigestion, abdominal pain, nausea, vomiting,  GU:  MS:  No-   joint pain or swelling. . Neuro- grossly normal to observation,   Psych:  No- change in mood or affect. No depression or anxiety.  No memory loss.  Objective:   Physical Exam General- Alert, Oriented, Affect-appropriate, Distress- none acute, petite/ thin lady Skin- rash-none, lesions- none, excoriation- none Lymphadenopathy- none Head- atraumatic            Eyes- Gross vision intact, PERRLA, conjunctivae clear secretions            Ears- Hearing, canals- normal            Nose- Clear, No-Septal dev, mucus, polyps, erosion, perforation             Throat- Mallampati II , mucosa clear- not red , drainage- none, tonsils- atrophic, Neck- flexible , trachea midline, no stridor , thyroid nl, carotid no bruit Chest - symmetrical excursion , unlabored           Heart/CV- RRR , no murmur , no gallop  , no rub, nl s1 s2                           - JVD-  none , edema- none, stasis changes- none, varices- none           Lung-  unlabored, wheeze+ mild, cough- none , dullness-none, rub-none           Chest wall- , Abd- Br/ Gen/ Rectal- Not done, not indicated Extrem- cyanosis- none, clubbing, none, atrophy- none, strength- nl Neuro- grossly intact to observation

## 2017-09-16 NOTE — Patient Instructions (Signed)
Order- CXR    Dx bronchitis exacerbation  Neb  xop 0.63          Depo 80  Please call if we can help

## 2017-09-17 NOTE — Assessment & Plan Note (Signed)
She denies significant recent reflux event or potential aspiration.  Continues reflux precautions.

## 2017-09-17 NOTE — Assessment & Plan Note (Signed)
I favor pollen related to exacerbation over infection given the timing and other details as discussed with her. Plan-CXR, nebulizer treatment Xopenex, Depo-Medrol

## 2017-09-20 ENCOUNTER — Telehealth: Payer: Self-pay | Admitting: Internal Medicine

## 2017-09-20 NOTE — Progress Notes (Signed)
See phone note from 09/20/17.

## 2017-09-20 NOTE — Telephone Encounter (Signed)
Called and spoke to pt. Informed her of the results per CY. Pt verbalized understanding and denied any further questions or concerns at this time.   Notes recorded by Deneise Lever, MD on 09/17/2017 at 8:55 AM EDT C- stable changes of COPD with bronchitis and bronchiectasis and some old scarring, as before. No new or active process seen.

## 2017-09-21 ENCOUNTER — Ambulatory Visit: Payer: Medicare Other | Admitting: Internal Medicine

## 2017-09-28 ENCOUNTER — Encounter: Payer: Self-pay | Admitting: Internal Medicine

## 2017-09-28 ENCOUNTER — Ambulatory Visit (INDEPENDENT_AMBULATORY_CARE_PROVIDER_SITE_OTHER): Payer: Medicare Other | Admitting: Internal Medicine

## 2017-09-28 ENCOUNTER — Other Ambulatory Visit (INDEPENDENT_AMBULATORY_CARE_PROVIDER_SITE_OTHER): Payer: Medicare Other

## 2017-09-28 VITALS — BP 120/70 | HR 65 | Temp 97.8°F | Ht 63.0 in | Wt 110.0 lb

## 2017-09-28 DIAGNOSIS — E782 Mixed hyperlipidemia: Secondary | ICD-10-CM

## 2017-09-28 DIAGNOSIS — E039 Hypothyroidism, unspecified: Secondary | ICD-10-CM

## 2017-09-28 DIAGNOSIS — F419 Anxiety disorder, unspecified: Secondary | ICD-10-CM

## 2017-09-28 DIAGNOSIS — F5105 Insomnia due to other mental disorder: Secondary | ICD-10-CM

## 2017-09-28 DIAGNOSIS — E559 Vitamin D deficiency, unspecified: Secondary | ICD-10-CM

## 2017-09-28 LAB — COMPREHENSIVE METABOLIC PANEL
ALT: 12 U/L (ref 0–35)
AST: 20 U/L (ref 0–37)
Albumin: 4.3 g/dL (ref 3.5–5.2)
Alkaline Phosphatase: 98 U/L (ref 39–117)
BUN: 16 mg/dL (ref 6–23)
CALCIUM: 9.6 mg/dL (ref 8.4–10.5)
CHLORIDE: 102 meq/L (ref 96–112)
CO2: 32 meq/L (ref 19–32)
CREATININE: 0.71 mg/dL (ref 0.40–1.20)
GFR: 84.56 mL/min (ref >60.00)
Glucose, Bld: 94 mg/dL (ref 70–99)
POTASSIUM: 4.4 meq/L (ref 3.5–5.1)
SODIUM: 141 meq/L (ref 135–145)
Total Bilirubin: 0.5 mg/dL (ref 0.2–1.2)
Total Protein: 7.9 g/dL (ref 6.0–8.3)

## 2017-09-28 LAB — CBC
HEMATOCRIT: 41.8 % (ref 36.0–46.0)
HEMOGLOBIN: 14.1 g/dL (ref 12.0–15.0)
MCHC: 33.8 g/dL (ref 30.0–36.0)
MCV: 94.9 fl (ref 78.0–100.0)
Platelets: 189 10*3/uL (ref 150.0–400.0)
RBC: 4.41 Mil/uL (ref 3.87–5.11)
RDW: 13.2 % (ref 11.5–15.5)
WBC: 6.5 10*3/uL (ref 4.0–10.5)

## 2017-09-28 LAB — VITAMIN D 25 HYDROXY (VIT D DEFICIENCY, FRACTURES): VITD: 34.48 ng/mL (ref 30.00–100.00)

## 2017-09-28 LAB — TSH: TSH: 2.21 u[IU]/mL (ref 0.35–4.50)

## 2017-09-28 LAB — LIPID PANEL
CHOL/HDL RATIO: 3
Cholesterol: 200 mg/dL (ref 0–200)
HDL: 63.1 mg/dL (ref >39.00)
LDL CALC: 122 mg/dL — AB (ref 0–99)
NonHDL: 137.07
Triglycerides: 77 mg/dL (ref 0.0–149.0)
VLDL: 15.4 mg/dL (ref 0.0–40.0)

## 2017-09-28 LAB — T4, FREE: FREE T4: 1.11 ng/dL (ref 0.60–1.60)

## 2017-09-28 NOTE — Assessment & Plan Note (Signed)
Uses valium rarely. Refill as needed. She is aware of risk of memory changes, increased risk of falls with this medicine.

## 2017-09-28 NOTE — Patient Instructions (Signed)
We will check the labs today and call you back about the results.    

## 2017-09-28 NOTE — Assessment & Plan Note (Signed)
Checking TSH and free T4 and adjust as needed synthroid 50/75 mcg daily.

## 2017-09-28 NOTE — Assessment & Plan Note (Signed)
Checking lipid panel and adjust simvastatin 20 mg daily.  °

## 2017-09-28 NOTE — Progress Notes (Signed)
   Subjective:    Patient ID: Marilyn Blake, female    DOB: 09-11-1940, 77 y.o.   MRN: [id number]  HPI The patient is a 77 YO female coming in for follow up of her thyroid (taking 50 mcg alternating with 75 mcg daily synthroid, denies energy changes or heat or cold intolerance), and her cholesterol (taking simvastatin 20 mg daily, denies side effects, denies chest pains or SOB, denies muscle aches), and her anxiety (using rare valium, takes only if needed, is aware of the risk of memory change or falls with long term usage).   Review of Systems  Constitutional: Negative.   HENT: Negative.   Eyes: Negative.   Respiratory: Negative for cough, chest tightness and shortness of breath.   Cardiovascular: Negative for chest pain, palpitations and leg swelling.  Gastrointestinal: Negative for abdominal distention, abdominal pain, constipation, diarrhea, nausea and vomiting.  Musculoskeletal: Negative.   Skin: Negative.   Neurological: Negative.   Psychiatric/Behavioral: Negative.       Objective:   Physical Exam  Constitutional: She is oriented to person, place, and time. She appears well-developed and well-nourished.  HENT:  Head: Normocephalic and atraumatic.  Eyes: EOM are normal.  Neck: Normal range of motion.  Cardiovascular: Normal rate and regular rhythm.  Pulmonary/Chest: Effort normal and breath sounds normal. No respiratory distress. She has no wheezes. She has no rales.  Abdominal: Soft. Bowel sounds are normal. She exhibits no distension. There is no tenderness. There is no rebound.  Musculoskeletal: She exhibits no edema.  Neurological: She is alert and oriented to person, place, and time. Coordination normal.  Skin: Skin is warm and dry.  Psychiatric: She has a normal mood and affect.   Vitals:   09/28/17 1017  BP: 120/70  Pulse: 65  Temp: 97.8 F (36.6 C)  TempSrc: Oral  SpO2: 95%  Weight: 110 lb (49.9 kg)  Height: 5\' 3"  (1.6 m)      Assessment & Plan:

## 2017-10-01 ENCOUNTER — Telehealth: Payer: Self-pay | Admitting: Internal Medicine

## 2017-10-01 NOTE — Telephone Encounter (Signed)
States that she noticed the last two visits that her BP used to be 100 but has been 120/70 at last 2 visits. And pulse was 65 that this is unusual to her. Still complaining about low energy and not feeling that well.informed patient that her BP and pulse is in regular range but that I would still send a message to doctor. Patient stated understanding

## 2017-10-01 NOTE — Telephone Encounter (Signed)
Results from labs are normal and available. This BP and HR are normal so not likely related to how she is feeling.

## 2017-10-01 NOTE — Telephone Encounter (Unsigned)
Copied from [place] #[id number]. Topic: Inquiry >> Oct 01, 2017  2:49 PM Neva Seat wrote: Pt wanting a call back asap when labs come in.   There has been change in BP - pt wants to discuss this as well.

## 2017-10-01 NOTE — Telephone Encounter (Signed)
Patient informed of lab results and stated understanding  

## 2017-10-24 ENCOUNTER — Other Ambulatory Visit: Payer: Self-pay | Admitting: Internal Medicine

## 2017-10-26 ENCOUNTER — Ambulatory Visit: Payer: Medicare Other | Admitting: Internal Medicine

## 2017-11-04 NOTE — Progress Notes (Signed)
Subjective:    Patient ID: Marilyn Blake, female    DOB: [birth year], 77 y.o.   MRN: [id number]  HPI The patient is here for an acute visit.   Insect bite:  She was bite on her abdomen Sunday night or Monday morning.  She is unsure what bite her.  She was worried about a tick bite.  She washed it with soap and water.  It was not itching.  It was painful.  She used hydrocortisone 2.5% cream and that seemed to help.  The bite itself looks better.  There is some mild redness, but she denies any itching or pain at this point.  She knew someone that was diagnosed with Lyme disease and that was her main concern.     She has not been feeling well.  She has been cramping in her legs, she has tingling in her feet and hands.  Her energy level is low.  She has been experiencing some headaches, but was unsure if they are related to allergies.  She denies any joint swelling.  She does have some joint pain, but does not think it is anything new.  She denies any rashes.  GERD: Her Prilosec has not been working as well.She wondered about adding Pepcid to the Prilosec.   Medications and allergies reviewed with patient and updated if appropriate.  Patient Active Problem List   Diagnosis Date Noted  . Pelvic pressure in female 06/04/2017  . Palpitations 09/10/2015  . Seasonal rhinitis 02/03/2015  . Bronchitis, chronic obstructive, with exacerbation-[facility] 10/24/2013  . 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 IRRITABLE BOWEL SYNDROME 08/07/2009  . GERD 02/21/2009  . Bronchiectasis ([place]) 02/16/2008  . Hypothyroidism 07/11/2007  . Hyperlipidemia 07/11/2007  . Osteopenia 07/11/2007    Current Outpatient Medications on File Prior to Visit  Medication Sig Dispense Refill  . CALCIUM-VITAMIN D PO Take by mouth 1 day or 1 dose.    . diazepam (VALIUM) 5 MG tablet TAKE ONE TABLET BY MOUTH EVERY 12 HOURS AS NEEDED FOR ANXIETY (Patient taking differently: 5 mg every 12 (twelve) hours as needed for anxiety  (patkes 1/2 tablet). TAKE ONE TABLET BY MOUTH EVERY 12 HOURS AS NEEDED FOR ANXIETY) 30 tablet 2  . levothyroxine (SYNTHROID, LEVOTHROID) 50 MCG tablet Take 1 tablet (50 mcg total) by mouth every other day. 45 tablet 3  . levothyroxine (SYNTHROID, LEVOTHROID) 75 MCG tablet TAKE 1 TABLET BY MOUTH EVERY OTHER DAY ALTERNATE WITH 50MCG 45 tablet 0  . loratadine (CLARITIN) 10 MG tablet Take 10 mg by mouth daily.    . Multiple Minerals-Vitamins (CALCIUM & VIT D3 BONE HEALTH PO) Take by mouth. Reported on 09/09/2015    . OMEGA 3 1000 MG CAPS Take by mouth daily.      Marland Kitchen omeprazole (PRILOSEC) 20 MG capsule Take 1 capsule (20 mg total) by mouth daily. 90 capsule 3  . Probiotic Product (PROBIOTIC-10 PO) Take by mouth 1 day or 1 dose.    . Psyllium (METAMUCIL PO) Take by mouth every other day.    . Sennosides (SENOKOT PO) Take by mouth.    . simvastatin (ZOCOR) 20 MG tablet Take 1 tablet (20 mg total) by mouth at bedtime. 90 tablet 3   No current facility-administered medications on file prior to visit.     Past Medical History:  Diagnosis Date  . ALLERGIC RHINITIS   . ANXIETY   . Arthritis   . BACTEREMIA, MYCOBACTERIUM AVIUM COMPLEX   . BRONCHIECTASIS   .  COLONIC POLYPS, HX OF   . Fundic gland polyps of stomach, benign   . GERD   . GOUT   . Headache   . HYPERLIPIDEMIA   . HYPOTHYROIDISM   . Irritable bowel syndrome   . OSTEOPENIA     Past Surgical History:  Procedure Laterality Date  . ABDOMINAL HYSTERECTOMY    . BLADDER SUSPENSION    . CARPAL TUNNEL RELEASE Left   . COLONOSCOPY    . MASS EXCISION Right 08/10/2013   Procedure: DEBRIDE DISTAL INTERPHALANGEAL JOINT/EXCISION MUCOID CYST RIGHT LONG FINGER and right small finger;  Surgeon: Cammie Sickle., MD;  Location: [facility];  Service: Orthopedics;  Laterality: Right;  . THYROIDECTOMY     Hurthele cell tumor  . UPPER GASTROINTESTINAL ENDOSCOPY      Social History   Socioeconomic History  . Marital status:  Married    Spouse name: Not on file  . Number of children: 3  . Years of education: Not on file  . Highest education level: Not on file  Occupational History  . Occupation: Retired    Fish farm manager: RETIRED  Social Needs  . Financial resource strain: Not hard at all  . Food insecurity:    Worry: Never true    Inability: Never true  . Transportation needs:    Medical: No    Non-medical: No  Tobacco Use  . Smoking status: Never Smoker  . Smokeless tobacco: Never Used  . Tobacco comment: Married, Housewife  Substance and Sexual Activity  . Alcohol use: No    Alcohol/week: 0.0 oz  . Drug use: No  . Sexual activity: Never  Lifestyle  . Physical activity:    Days per week: 4 days    Minutes per session: 40 min  . Stress: Not on file  Relationships  . Social connections:    Talks on phone: More than three times a week    Gets together: More than three times a week    Attends religious service: More than 4 times per year    Active member of club or organization: Not on file    Attends meetings of clubs or organizations: More than 4 times per year    Relationship status: Married  Other Topics Concern  . Not on file  Social History Narrative  . Not on file    Family History  Problem Relation Age of Onset  . Cervical cancer Mother 68        died age 50  . Heart attack Father 20  . Colon cancer Neg Hx   . Esophageal cancer Neg Hx   . Stomach cancer Neg Hx   . Rectal cancer Neg Hx     Review of Systems  Constitutional: Positive for fatigue. Negative for chills and fever.  Cardiovascular: Positive for palpitations (at times). Negative for chest pain.  Gastrointestinal: Positive for constipation. Negative for abdominal pain, diarrhea and nausea.  Musculoskeletal: Negative for joint swelling.       Cramping in feet and calves  Skin: Negative for rash.  Neurological: Positive for numbness (tingling in hands and feet) and headaches (new, allergy?).       Objective:    Vitals:   11/05/17 1329  BP: 120/68  Pulse: 72  Resp: 16  Temp: 98.2 F (36.8 C)  SpO2: 95%   BP Readings from Last 3 Encounters:  11/05/17 120/68  09/28/17 120/70  09/16/17 120/78   Wt Readings from Last 3 Encounters:  11/05/17 112 lb (  50.8 kg)  09/28/17 110 lb (49.9 kg)  09/16/17 111 lb 6.4 oz (50.5 kg)   Body mass index is 19.84 kg/m.   Physical Exam    Constitutional: Appears well-developed and well-nourished. No distress.  HENT:  Head: Normocephalic and atraumatic.  Neck: Neck supple. No tracheal deviation present. No thyromegaly present.  No cervical lymphadenopathy Cardiovascular: Normal rate, regular rhythm and normal heart sounds.   2/6 systolic murmur heard. No carotid bruit .  No edema Pulmonary/Chest: Effort normal and breath sounds normal. No respiratory distress. No has no wheezes. No rales.  Skin: Skin is warm and dry. Not diaphoretic.  Bug bite on right mid abdomen with erythema the size of a grape with central puncture mark and mild dry skin around, no fluctuance, no induration, nontender; no rashes Musculoskeletal: No joint effusions   or swelling Psychiatric: Normal mood and affect. Behavior is normal.       Assessment & Plan:    See Problem List for Assessment and Plan of chronic medical problems.

## 2017-11-05 ENCOUNTER — Encounter: Payer: Self-pay | Admitting: Internal Medicine

## 2017-11-05 ENCOUNTER — Other Ambulatory Visit (INDEPENDENT_AMBULATORY_CARE_PROVIDER_SITE_OTHER): Payer: Medicare Other

## 2017-11-05 ENCOUNTER — Ambulatory Visit (INDEPENDENT_AMBULATORY_CARE_PROVIDER_SITE_OTHER): Payer: Medicare Other | Admitting: Internal Medicine

## 2017-11-05 VITALS — BP 120/68 | HR 72 | Temp 98.2°F | Resp 16 | Wt 112.0 lb

## 2017-11-05 DIAGNOSIS — K219 Gastro-esophageal reflux disease without esophagitis: Secondary | ICD-10-CM

## 2017-11-05 DIAGNOSIS — W57XXXA Bitten or stung by nonvenomous insect and other nonvenomous arthropods, initial encounter: Secondary | ICD-10-CM

## 2017-11-05 DIAGNOSIS — R202 Paresthesia of skin: Secondary | ICD-10-CM

## 2017-11-05 DIAGNOSIS — S30861A Insect bite (nonvenomous) of abdominal wall, initial encounter: Secondary | ICD-10-CM | POA: Diagnosis not present

## 2017-11-05 LAB — COMPREHENSIVE METABOLIC PANEL
ALT: 12 U/L (ref 0–35)
AST: 21 U/L (ref 0–37)
Albumin: 4.4 g/dL (ref 3.5–5.2)
Alkaline Phosphatase: 98 U/L (ref 39–117)
BUN: 12 mg/dL (ref 6–23)
CO2: 33 meq/L — AB (ref 19–32)
Calcium: 9.7 mg/dL (ref 8.4–10.5)
Chloride: 101 mEq/L (ref 96–112)
Creatinine, Ser: 0.76 mg/dL (ref 0.40–1.20)
GFR: 78.15 mL/min (ref >60.00)
Glucose, Bld: 92 mg/dL (ref 70–99)
POTASSIUM: 3.8 meq/L (ref 3.5–5.1)
SODIUM: 140 meq/L (ref 135–145)
Total Bilirubin: 0.4 mg/dL (ref 0.2–1.2)
Total Protein: 7.9 g/dL (ref 6.0–8.3)

## 2017-11-05 LAB — CBC WITH DIFFERENTIAL/PLATELET
BASOS PCT: 0.6 % (ref 0.0–3.0)
Basophils Absolute: 0 10*3/uL (ref 0.0–0.1)
EOS PCT: 1 % (ref 0.0–5.0)
Eosinophils Absolute: 0.1 10*3/uL (ref 0.0–0.7)
HCT: 40 % (ref 36.0–46.0)
Hemoglobin: 13.7 g/dL (ref 12.0–15.0)
LYMPHS ABS: 2.6 10*3/uL (ref 0.7–4.0)
Lymphocytes Relative: 37.3 % (ref 12.0–46.0)
MCHC: 34.3 g/dL (ref 30.0–36.0)
MCV: 95 fl (ref 78.0–100.0)
MONO ABS: 0.5 10*3/uL (ref 0.1–1.0)
Monocytes Relative: 7.2 % (ref 3.0–12.0)
NEUTROS PCT: 53.9 % (ref 43.0–77.0)
Neutro Abs: 3.8 10*3/uL (ref 1.4–7.7)
PLATELETS: 193 10*3/uL (ref 150.0–400.0)
RBC: 4.21 Mil/uL (ref 3.87–5.11)
RDW: 13.4 % (ref 11.5–15.5)
WBC: 7.1 10*3/uL (ref 4.0–10.5)

## 2017-11-05 LAB — VITAMIN B12: VITAMIN B 12: 677 pg/mL (ref 211–911)

## 2017-11-05 MED ORDER — FAMOTIDINE 20 MG PO TABS: 20.0000 mg | ORAL_TABLET | Freq: Two times a day (BID) | ORAL | 1 refills | Status: DC

## 2017-11-05 MED ORDER — LEVOTHYROXINE SODIUM 75 MCG PO TABS: ORAL_TABLET | ORAL | 0 refills | Status: DC

## 2017-11-05 NOTE — Assessment & Plan Note (Addendum)
Unlikely tick bite , more likely a spider bite Will check lyme - that is her concern, but reassured her's her symptoms are not convincing of Lyme disease Also check CBC, CMP The bite does not look infected and is resolving with topical treatment-can continue hydrocortisone cream

## 2017-11-05 NOTE — Patient Instructions (Signed)
  Test(s) ordered today. Your results will be released to MyChart (or called to you) after review, usually within 72hours after test completion. If any changes need to be made, you will be notified at that same time.    Medications reviewed and updated.  No changes recommended at this time.     

## 2017-11-06 NOTE — Assessment & Plan Note (Signed)
Having tingling in her hands and feet Advised this is likely related to Lyme disease or stroke We will check her vitamin B12 level

## 2017-11-06 NOTE — Assessment & Plan Note (Signed)
GERD currently not controlled with Prilosec 20 mg daily We will add Pepcid 20 mg 1-2 times daily

## 2017-11-08 LAB — B. BURGDORFI ANTIBODIES

## 2017-11-24 DIAGNOSIS — G514 Facial myokymia: Secondary | ICD-10-CM | POA: Diagnosis not present

## 2017-11-24 DIAGNOSIS — H16223 Keratoconjunctivitis sicca, not specified as Sjogren's, bilateral: Secondary | ICD-10-CM | POA: Diagnosis not present

## 2017-11-24 DIAGNOSIS — H5712 Ocular pain, left eye: Secondary | ICD-10-CM | POA: Diagnosis not present

## 2017-11-24 DIAGNOSIS — H04123 Dry eye syndrome of bilateral lacrimal glands: Secondary | ICD-10-CM | POA: Diagnosis not present

## 2017-11-25 DIAGNOSIS — Z86010 Personal history of colonic polyps: Secondary | ICD-10-CM | POA: Diagnosis not present

## 2017-11-25 DIAGNOSIS — K219 Gastro-esophageal reflux disease without esophagitis: Secondary | ICD-10-CM | POA: Diagnosis not present

## 2017-11-28 ENCOUNTER — Other Ambulatory Visit: Payer: Self-pay | Admitting: Internal Medicine

## 2017-11-29 NOTE — Telephone Encounter (Signed)
Control database checked last refill: 10/06/2017 LOV: 11/05/2017 with Dr. Quay Burow, 09/28/2017 with PCP

## 2017-12-08 DIAGNOSIS — Z86010 Personal history of colonic polyps: Secondary | ICD-10-CM | POA: Diagnosis not present

## 2017-12-08 DIAGNOSIS — K640 First degree hemorrhoids: Secondary | ICD-10-CM | POA: Diagnosis not present

## 2017-12-08 DIAGNOSIS — K317 Polyp of stomach and duodenum: Secondary | ICD-10-CM | POA: Diagnosis not present

## 2017-12-08 DIAGNOSIS — K635 Polyp of colon: Secondary | ICD-10-CM | POA: Diagnosis not present

## 2017-12-08 DIAGNOSIS — K219 Gastro-esophageal reflux disease without esophagitis: Secondary | ICD-10-CM | POA: Diagnosis not present

## 2017-12-10 DIAGNOSIS — K635 Polyp of colon: Secondary | ICD-10-CM | POA: Diagnosis not present

## 2017-12-10 DIAGNOSIS — K317 Polyp of stomach and duodenum: Secondary | ICD-10-CM | POA: Diagnosis not present

## 2017-12-11 LAB — HM COLONOSCOPY

## 2017-12-23 ENCOUNTER — Encounter: Payer: Self-pay | Admitting: Internal Medicine

## 2017-12-23 ENCOUNTER — Ambulatory Visit (INDEPENDENT_AMBULATORY_CARE_PROVIDER_SITE_OTHER): Payer: Medicare Other | Admitting: Internal Medicine

## 2017-12-23 ENCOUNTER — Other Ambulatory Visit (INDEPENDENT_AMBULATORY_CARE_PROVIDER_SITE_OTHER): Payer: Medicare Other

## 2017-12-23 VITALS — BP 100/68 | HR 76 | Temp 97.8°F | Ht 63.0 in | Wt 111.0 lb

## 2017-12-23 DIAGNOSIS — E538 Deficiency of other specified B group vitamins: Secondary | ICD-10-CM

## 2017-12-23 DIAGNOSIS — R0789 Other chest pain: Secondary | ICD-10-CM | POA: Insufficient documentation

## 2017-12-23 DIAGNOSIS — E039 Hypothyroidism, unspecified: Secondary | ICD-10-CM

## 2017-12-23 DIAGNOSIS — R739 Hyperglycemia, unspecified: Secondary | ICD-10-CM

## 2017-12-23 DIAGNOSIS — M791 Myalgia, unspecified site: Secondary | ICD-10-CM | POA: Insufficient documentation

## 2017-12-23 DIAGNOSIS — R202 Paresthesia of skin: Secondary | ICD-10-CM | POA: Diagnosis not present

## 2017-12-23 LAB — COMPREHENSIVE METABOLIC PANEL
ALK PHOS: 92 U/L (ref 39–117)
ALT: 15 U/L (ref 0–35)
AST: 22 U/L (ref 0–37)
Albumin: 4.1 g/dL (ref 3.5–5.2)
BILIRUBIN TOTAL: 0.5 mg/dL (ref 0.2–1.2)
BUN: 16 mg/dL (ref 6–23)
CO2: 32 mEq/L (ref 19–32)
Calcium: 9.8 mg/dL (ref 8.4–10.5)
Chloride: 100 mEq/L (ref 96–112)
Creatinine, Ser: 0.78 mg/dL (ref 0.40–1.20)
GFR: 75.82 mL/min (ref >60.00)
GLUCOSE: 93 mg/dL (ref 70–99)
POTASSIUM: 3.7 meq/L (ref 3.5–5.1)
SODIUM: 139 meq/L (ref 135–145)
TOTAL PROTEIN: 7.8 g/dL (ref 6.0–8.3)

## 2017-12-23 LAB — CBC
HEMATOCRIT: 40.3 % (ref 36.0–46.0)
Hemoglobin: 13.4 g/dL (ref 12.0–15.0)
MCHC: 33.3 g/dL (ref 30.0–36.0)
MCV: 95 fl (ref 78.0–100.0)
Platelets: 207 10*3/uL (ref 150.0–400.0)
RBC: 4.24 Mil/uL (ref 3.87–5.11)
RDW: 13.1 % (ref 11.5–15.5)
WBC: 5.9 10*3/uL (ref 4.0–10.5)

## 2017-12-23 LAB — TROPONIN I: TNIDX: 0 ug/L (ref 0.00–0.06)

## 2017-12-23 LAB — T4, FREE: FREE T4: 1.08 ng/dL (ref 0.60–1.60)

## 2017-12-23 LAB — VITAMIN B12: Vitamin B-12: 610 pg/mL (ref 211–911)

## 2017-12-23 LAB — HEMOGLOBIN A1C: Hgb A1c MFr Bld: 5.8 % (ref 4.6–6.5)

## 2017-12-23 LAB — CK: Total CK: 30 U/L (ref 7–177)

## 2017-12-23 LAB — MAGNESIUM: Magnesium: 2.3 mg/dL (ref 1.5–2.5)

## 2017-12-23 LAB — TSH: TSH: 1.84 u[IU]/mL (ref 0.35–4.50)

## 2017-12-23 NOTE — Progress Notes (Signed)
   Subjective:    Patient ID: Marilyn Blake, female    DOB: 08-Mar-1941, 77 y.o.   MRN: [id number]  HPI The patient is a 77 YO female coming in for several concerns including tingling in hands and feet (started about 2 weeks or so ago, denies change in diet, no change in activity, denies decreased oral intake, several insect bites this summer, has been checked for lyme disease about 1-2 months ago which was negative) and muscle cramps (denies lack of oral intake, hydration is good, denies change in diet, is concerned about it being related to simvastatin, skipped a couple of doses of it) and chest pressure (started about 2 weeks or so ago, has taken otc indigestion meds which did not help much, denies it now, denies sweating or pain in arms or chin when it happens, worries due to her high cholesterol and vague cardiac symptoms in women).   Review of Systems  Constitutional: Positive for activity change and fatigue.  HENT: Negative.   Eyes: Negative.   Respiratory: Positive for chest tightness. Negative for cough and shortness of breath.   Cardiovascular: Positive for chest pain. Negative for palpitations and leg swelling.  Gastrointestinal: Positive for abdominal pain. Negative for abdominal distention, constipation, diarrhea, nausea and vomiting.  Musculoskeletal: Positive for myalgias. Negative for arthralgias, back pain, joint swelling, neck pain and neck stiffness.  Skin: Negative.   Neurological: Positive for numbness.  Psychiatric/Behavioral: Negative.       Objective:   Physical Exam  Constitutional: She is oriented to person, place, and time. She appears well-developed and well-nourished.  HENT:  Head: Normocephalic and atraumatic.  Eyes: EOM are normal.  Neck: Normal range of motion.  Cardiovascular: Normal rate and regular rhythm.  Pulmonary/Chest: Effort normal and breath sounds normal. No respiratory distress. She has no wheezes. She has no rales.  Abdominal: Soft. Bowel sounds  are normal. She exhibits no distension. There is no tenderness. There is no rebound.  Musculoskeletal: She exhibits no edema.  Neurological: She is alert and oriented to person, place, and time. Coordination normal.  Skin: Skin is warm and dry.  Psychiatric: She has a normal mood and affect.   Vitals:   12/23/17 1016  BP: 100/68  Pulse: 76  Temp: 97.8 F (36.6 C)  TempSrc: Oral  SpO2: 94%  Weight: 111 lb (50.3 kg)  Height: 5\' 3"  (1.6 m)   EKG: Rate 59, axis normal, interval normal, sinus brady, no st or t wave changes, no significant change from 10/2016    Assessment & Plan:

## 2017-12-23 NOTE — Assessment & Plan Note (Signed)
Checking TSH and free T4 given the tingling she is having in hands and feet.

## 2017-12-23 NOTE — Assessment & Plan Note (Addendum)
Checking CK and magnesium. Prior CMP normal. She is taking simvastatin which can cause muscle pains. Depending on labs may be asked to hold simvastatin for 2-3 weeks and see if this makes a difference.

## 2017-12-23 NOTE — Assessment & Plan Note (Signed)
Checking thyroid, HgA1c, B12, recent vitamin D normal so will not repeat, recent CBC and CMP normal, CK, troponin. Adjust as needed.

## 2017-12-23 NOTE — Patient Instructions (Signed)
Your EKG is normal. We will check the labs today and call you back about the results.   Depending on the lab results we may have you stop the simvastatin for 2-3 weeks to see if this is causing the tingling and muscle pains.

## 2017-12-23 NOTE — Assessment & Plan Note (Signed)
Checking EKG, troponin, CK today. Suspect from indigestion. She does have high cholesterol and age is a risk factor for CAD. Prior EKG normal. No changes to EKG today. BP at goal without meds.

## 2017-12-31 ENCOUNTER — Encounter: Payer: Self-pay | Admitting: Internal Medicine

## 2017-12-31 NOTE — Progress Notes (Signed)
Abstracted and sent to scan  

## 2018-01-04 ENCOUNTER — Ambulatory Visit: Payer: Self-pay

## 2018-01-04 NOTE — Telephone Encounter (Signed)
Outgoing call to patient who complains of a tick bite.on yesterday.  States it was a "tiny tick".  It was  Located in the groin area near her vaginal area. States it was not there long because it came right off w ith her finger.  She states that she realizes that she should have removed it with tweezers but she didn't. Feels that her Tetanus booster is still current.  Patient states that she has been cleaning site with  warm soap and water and usinging  mutirocin 2% and hyrocortisone 2%.  Denies fever, coughing pain rated 3. Provided care advice. Voiced understanding.  Encouraged to call back  if symptoms worsen.      Reason for Disposition . Tick bite with no complications  Answer Assessment - Initial Assessment Questions 1. TYPE of TICK: "Is it a wood tick or a deer tick?" If unsure, ask: "What size was the tick?" "Did it look more like a watermelon seed or a poppy seed?"      Tiny   2. LOCATION: "Where is the tick bite located?"      Groin area beside vagina 3. ONSET: "How long do you think the tick was attached before you removed it?" (Hours or days)      Not long came right of with finger 4. TETANUS: "When was the last tetanus booster?"       Think its still current. 5. PREGNANCY: "Is there any chance you are pregnant?" "When was your last menstrual period?"     na  Protocols used: TICK BITE-A-AH

## 2018-02-14 ENCOUNTER — Other Ambulatory Visit: Payer: Self-pay | Admitting: Internal Medicine

## 2018-03-12 DIAGNOSIS — Z23 Encounter for immunization: Secondary | ICD-10-CM | POA: Diagnosis not present

## 2018-04-01 ENCOUNTER — Ambulatory Visit (INDEPENDENT_AMBULATORY_CARE_PROVIDER_SITE_OTHER): Payer: Medicare Other | Admitting: Internal Medicine

## 2018-04-01 ENCOUNTER — Encounter: Payer: Self-pay | Admitting: Internal Medicine

## 2018-04-01 VITALS — BP 100/62 | HR 70 | Temp 97.4°F | Ht 63.0 in | Wt 112.0 lb

## 2018-04-01 DIAGNOSIS — R350 Frequency of micturition: Secondary | ICD-10-CM | POA: Diagnosis not present

## 2018-04-01 DIAGNOSIS — R102 Pelvic and perineal pain: Secondary | ICD-10-CM | POA: Diagnosis not present

## 2018-04-01 LAB — POCT URINALYSIS DIPSTICK
Bilirubin, UA: NEGATIVE
Glucose, UA: NEGATIVE
KETONES UA: NEGATIVE
LEUKOCYTES UA: NEGATIVE
NITRITE UA: NEGATIVE
PROTEIN UA: NEGATIVE
RBC UA: NEGATIVE
SPEC GRAV UA: 1.01 (ref 1.010–1.025)
UROBILINOGEN UA: 0.2 U/dL
pH, UA: 7.5 (ref 5.0–8.0)

## 2018-04-01 MED ORDER — NITROFURANTOIN MONOHYD MACRO 100 MG PO CAPS: 100.0000 mg | ORAL_CAPSULE | Freq: Two times a day (BID) | ORAL | 0 refills | Status: DC

## 2018-04-01 MED ORDER — TRIAMCINOLONE ACETONIDE 0.1 % EX CREA: 1.0000 "application " | TOPICAL_CREAM | Freq: Two times a day (BID) | CUTANEOUS | 0 refills | Status: DC

## 2018-04-01 NOTE — Patient Instructions (Signed)
We have sent in triamcinolone cream to use on the labia and vagina if needed for itching up to twice a day for 2 weeks maximum.   We have sent in macrobid to take 1 pill twice a day for 1 week.

## 2018-04-01 NOTE — Assessment & Plan Note (Signed)
Rx for triamcinolone for irritation external and macrobid. POC U/A done in the office. If no improvement needs to see her gyn.

## 2018-04-01 NOTE — Progress Notes (Signed)
   Subjective:    Patient ID: Marilyn Blake, female    DOB: Feb 26, 1941, 77 y.o.   MRN: [id number]  HPI The patient is a 77 YO female coming in for vaginal irritation and itching/burning. There is a lot of pain when urinating and some more burning at that time. She denies vaginal discharge or rash. Has been trying not to scratch. Prior prolapse but denies that currently. Denies fevers or chills. Denies abdominal pain or nausea or vomiting. Has tried azo and cranberry juice with some relief.   Review of Systems  Constitutional: Negative.   Respiratory: Negative.   Cardiovascular: Negative.   Gastrointestinal: Positive for abdominal pain. Negative for abdominal distention, constipation, diarrhea, nausea and vomiting.  Genitourinary: Positive for dysuria, frequency, urgency and vaginal pain.       Vaginal itching and perineal itching  Musculoskeletal: Negative.   Skin: Negative.       Objective:   Physical Exam  Constitutional: She is oriented to person, place, and time. She appears well-developed and well-nourished.  HENT:  Head: Normocephalic and atraumatic.  Eyes: EOM are normal.  Neck: Normal range of motion.  Cardiovascular: Normal rate and regular rhythm.  Pulmonary/Chest: Effort normal and breath sounds normal. No respiratory distress. She has no wheezes. She has no rales.  Abdominal: Soft. Bowel sounds are normal. She exhibits no distension. There is no tenderness. There is no rebound.  Musculoskeletal: She exhibits no edema.  Neurological: She is alert and oriented to person, place, and time. Coordination normal.  Skin: Skin is warm and dry.   Vitals:   04/01/18 1424  BP: 100/62  Pulse: 70  Temp: (!) 97.4 F (36.3 C)  TempSrc: Oral  SpO2: 96%  Weight: 112 lb (50.8 kg)  Height: 5\' 3"  (1.6 m)      Assessment & Plan:

## 2018-04-04 ENCOUNTER — Ambulatory Visit: Payer: Self-pay

## 2018-04-04 ENCOUNTER — Telehealth: Payer: Self-pay

## 2018-04-04 NOTE — Telephone Encounter (Signed)
LVM informing patient of MD response  

## 2018-04-04 NOTE — Telephone Encounter (Signed)
Copied from [place] [id number]. Topic: General - Other >> Apr 04, 2018 10:06 AM Janace Aris A wrote: Pt called in wanting to have the results of their UA pt would like a call back regarding this.

## 2018-04-04 NOTE — Telephone Encounter (Signed)
Called pt who had C/O vaginal burning and was seen in the office last Friday Nov 15. Pt states the burning is 5-6 on the pain scale. Pt is concerned that the symptoms continue and is wondering if she need to take the Macrobid prescribed because the UA was negative. Call was placed to office FC. Per FC she will call patient with advice as soon as she can speak wirh Dr Sharlet Salina. Pt discharge instructions reviewed with patient to include the use of the cream x2 weeks. Care advice read to patient. Pt verbalized understanding of all. Pt also would like the referral to GYN as discussed at her last visit.  Reason for Disposition . [1] Rash (e.g., redness, tiny bumps, sore) of genital area AND [2] present < 24 hours  Answer Assessment - Initial Assessment Questions 1. SYMPTOM: "What's the main symptom you're concerned about?" (e.g., pain, itching, dryness)     Pain itching 2. LOCATION: "Where is the  pain located?" (e.g., inside/outside, left/right)   Burning and outer edges 3. ONSET: "When did the  pain Friday about a week ago  start?"     Friday about a week ago  4. PAIN: "Is there any pain?" If so, ask: "How bad is it?" (Scale: 1-10; mild, moderate, severe)     5 or 6 5. ITCHING: "Is there any itching?" If so, ask: "How bad is it?" (Scale: 1-10; mild, moderate, severe)     no 6. CAUSE: "What do you think is causing the discharge?" "Have you had the same problem before? What happened then?"     no 7. OTHER SYMPTOMS: "Do you have any other symptoms?" (e.g., fever, itching, vaginal bleeding, pain with urination, injury to genital area, vaginal foreign body)     No not much its on the outer edges 8. PREGNANCY: "Is there any chance you are pregnant?" "When was your last menstrual period?"     N/A  Protocols used: VAGINAL [facility]

## 2018-04-04 NOTE — Telephone Encounter (Signed)
Was no bacteria.

## 2018-04-04 NOTE — Telephone Encounter (Signed)
Received call from [facility] stating pt was on the phone and would like to know if she needs to continue antibiotic since Urinalysis came back negative.

## 2018-05-24 ENCOUNTER — Other Ambulatory Visit: Payer: Self-pay | Admitting: Internal Medicine

## 2018-05-24 DIAGNOSIS — Z1231 Encounter for screening mammogram for malignant neoplasm of breast: Secondary | ICD-10-CM

## 2018-06-09 ENCOUNTER — Ambulatory Visit (INDEPENDENT_AMBULATORY_CARE_PROVIDER_SITE_OTHER): Payer: Medicare Other | Admitting: Family Medicine

## 2018-06-09 ENCOUNTER — Ambulatory Visit (INDEPENDENT_AMBULATORY_CARE_PROVIDER_SITE_OTHER): Payer: Medicare Other

## 2018-06-09 ENCOUNTER — Encounter: Payer: Self-pay | Admitting: Family Medicine

## 2018-06-09 VITALS — BP 108/60 | HR 69 | Temp 97.9°F | Ht 63.0 in | Wt 109.0 lb

## 2018-06-09 DIAGNOSIS — R0781 Pleurodynia: Secondary | ICD-10-CM | POA: Diagnosis not present

## 2018-06-09 DIAGNOSIS — S299XXA Unspecified injury of thorax, initial encounter: Secondary | ICD-10-CM | POA: Diagnosis not present

## 2018-06-09 MED ORDER — ACETAMINOPHEN-CODEINE #3 300-30 MG PO TABS: 1.0000 | ORAL_TABLET | Freq: Three times a day (TID) | ORAL | 0 refills | Status: DC | PRN

## 2018-06-09 NOTE — Progress Notes (Signed)
Patient: Marilyn Blake MRN: [id number] DOB: [birth year] PCP: Hoyt Koch, MD     Subjective:  Chief Complaint  Patient presents with  . Chest Pain    left sided x 1 week    HPI: The patient is a 78 y.o. female who presents today for left sided rib pain. She was putting up decorations. She was holding a box and ran into a wall and the box hit her in the ribs. Pain is on the left side on the ribs along T8-T9, lateral side. The box was not heavy, it was just that she walked into the wall with full force. She does have a history of osteopenia. Pain rated as a 7/10 and it hurts with big deep breaths. It is worse when she gets in the bed. She has taken ibuprofen and this has helped her. She has not had a cough or shortness of breath. She is feeling a little better today.   Review of Systems  Constitutional: Negative for fatigue.  Cardiovascular: Positive for chest pain.       C/o left sided rib pain.  Injury on approx 1/14  Gastrointestinal: Negative for abdominal pain, nausea and vomiting.  Musculoskeletal: Positive for back pain.       C/o left sided rib pain that intermittently radiates to mid left back  Neurological: Negative for dizziness and headaches.    Allergies Patient is allergic to ethambutol hcl and moxifloxacin.  Past Medical History Patient  has a past medical history of ALLERGIC RHINITIS, ANXIETY, Arthritis, BACTEREMIA, MYCOBACTERIUM AVIUM COMPLEX, BRONCHIECTASIS, COLONIC POLYPS, HX OF, Fundic gland polyps of stomach, benign, GERD, GOUT, Headache, HYPERLIPIDEMIA, HYPOTHYROIDISM, Irritable bowel syndrome, and OSTEOPENIA.  Surgical History Patient  has a past surgical history that includes Thyroidectomy; Abdominal hysterectomy; Bladder suspension; Carpal tunnel release (Left); Mass excision (Right, 08/10/2013); Colonoscopy; and Upper gastrointestinal endoscopy.  Family History Pateint's family history includes Cervical cancer (age of onset: 79) in her mother; Heart  attack (age of onset: 34) in her father.  Social History Patient  reports that she has never smoked. She has never used smokeless tobacco. She reports that she does not drink alcohol or use drugs.    Objective: Vitals:   06/09/18 1049  BP: 108/60  Pulse: 69  Temp: 97.9 F (36.6 C)  TempSrc: Oral  SpO2: 97%  Weight: 109 lb (49.4 kg)  Height: _0  (1.6 m)    Body mass index is 19.31 kg/m.  Physical Exam Vitals signs reviewed.  Constitutional:      Appearance: She is well-developed.  Cardiovascular:     Rate and Rhythm: Normal rate and regular rhythm.     Heart sounds: Normal heart sounds.  Pulmonary:     Effort: Pulmonary effort is normal.     Breath sounds: Normal breath sounds.  Abdominal:     General: Bowel sounds are normal.     Palpations: Abdomen is soft.  Musculoskeletal:     Comments: TTP over anterior-lateral T8-T9 ribs, no erythema/edema   Neurological:     Mental Status: She is alert.    Cxr/rib xray: possible fracture. Official read pending.     Assessment/plan: 1. Rib pain on left side Appears to be possible fracture, but official read pending. non displaced from my read. Will do tylenol 3 for bad pain or at night. Discussed deep breathing and that healing time can take 1-3 months for fracture or even bad bruise. Let us know if any SOB, increasing pain, fevers. Handout given. Side effects of  tylenol 3 discussed as well.   - DG Ribs Unilateral W/Chest Left; Future   Return if symptoms worsen or fail to improve.   Orma Flaming, MD [place]   06/09/2018

## 2018-06-09 NOTE — Patient Instructions (Signed)
Tylenol 3 for night time or severe pain. Heating pad and make sure take deep breaths.   Rib Fracture  A rib fracture is a break or crack in one of the bones of the ribs. The ribs are like a cage that goes around your upper chest. A broken or cracked rib is often painful, but most do not cause other problems. Most rib fractures usually heal on their own in 1-3 months. Follow these instructions at home: Managing pain, stiffness, and swelling  If directed, apply ice to the injured area. ? Put ice in a plastic bag. ? Place a towel between your skin and the bag. ? Leave the ice on for 20 minutes, 2-3 times a day.  Take over-the-counter and prescription medicines only as told by your doctor. Activity  Avoid activities that cause pain to the injured area. Protect your injured area.  Slowly increase activity as told by your doctor. General instructions  Do deep breathing as told by your doctor. You may be told to: ? Take deep breaths many times a day. ? Cough many times a day while hugging a pillow. ? Use a device (incentive spirometer) to do deep breathing many times a day.  Drink enough fluid to keep your pee (urine) clear or pale yellow.  Do not wear a rib belt or binder. These do not allow you to breathe deeply.  Keep all follow-up visits as told by your doctor. This is important. Contact a doctor if:  You have a fever. Get help right away if:  You have trouble breathing.  You are short of breath.  You cannot stop coughing.  You cough up thick or bloody spit (sputum).  You feel sick to your stomach (nauseous), throw up (vomit), or have belly (abdominal) pain.  Your pain gets worse and medicine does not help. Summary  A rib fracture is a break or crack in one of the bones of the ribs.  Apply ice to the injured area and take medicines for pain as told by your doctor.  Take deep breaths and cough many times a day. Hug a pillow every time you cough. This information is  not intended to replace advice given to you by your health care provider. Make sure you discuss any questions you have with your health care provider. Document Released: 02/11/2008 Document Revised: 08/04/2016 Document Reviewed: 08/04/2016 Elsevier Interactive Patient Education  2019 [employer].

## 2018-06-10 ENCOUNTER — Ambulatory Visit: Payer: Medicare Other | Admitting: Family

## 2018-06-21 ENCOUNTER — Ambulatory Visit: Payer: Medicare Other

## 2018-06-30 ENCOUNTER — Other Ambulatory Visit: Payer: Self-pay | Admitting: Internal Medicine

## 2018-07-07 ENCOUNTER — Ambulatory Visit
Admission: RE | Admit: 2018-07-07 | Discharge: 2018-07-07 | Disposition: A | Discharge status: Home / Self Care | Payer: Medicare Other | Source: Ambulatory Visit | Attending: Internal Medicine | Admitting: Internal Medicine

## 2018-07-07 DIAGNOSIS — Z1231 Encounter for screening mammogram for malignant neoplasm of breast: Secondary | ICD-10-CM

## 2018-07-19 ENCOUNTER — Other Ambulatory Visit: Payer: Self-pay | Admitting: Internal Medicine

## 2018-07-20 NOTE — Telephone Encounter (Signed)
Control database checked last refill: 05/02/2018  LOV: 04/01/2018 acute, 12/23/2017 hypothyroidism NOV: none

## 2018-07-21 NOTE — Progress Notes (Signed)
Subjective:   Marilyn Blake is a 78 y.o. female who presents for Medicare Annual (Subsequent) preventive examination.  Review of Systems:  No ROS.  Medicare Wellness Visit. Additional risk factors are reflected in the social history.  Cardiac Risk Factors include: advanced age (>48mn, >>26women);dyslipidemia Sleep patterns: feels rested on waking, gets up 1-2 times nightly to void and sleeps 7 hours nightly.    Home Safety/Smoke Alarms: Feels safe in home. Smoke alarms in place.  Living environment; residence and Firearm Safety: [name] can live on one level. Lives with husband, no needs for DME, good support system Seat Belt Safety/Bike Helmet: Wears seat belt.     Objective:     Vitals: BP 102/66   Pulse 77   Resp 17   Ht _0  (1.6 m)   Wt 112 lb (50.8 kg)   LMP 08/10/2013 (LMP Unknown) Comment: post menopausal  SpO2 98%   BMI 19.84 kg/m   Body mass index is 19.84 kg/m.  Advanced Directives 07/25/2018 06/17/2017 11/12/2016 06/16/2016 12/07/2014 07/08/2012  Does Patient Have a Medical Advance Directive? Yes No No No No Patient does not have advance directive  Type of AScientist, forensicPower of ASpring MillLiving will - - - - -  Copy of HCul[place]perin Chart? No - copy requested - - - - -  Would patient like information on creating a medical advance directive? - Yes (ED - Information included in AVS) No - Patient declined Yes (MAU/Ambulatory/Procedural Areas - Information given) - -    Tobacco Social History   Tobacco Use  Smoking Status Never Smoker  Smokeless Tobacco Never Used  Tobacco Comment   Married, Housewife     Counseling given: Not Answered Comment: Married, Housewife  Past Medical History:  Diagnosis Date  . ALLERGIC RHINITIS   . ANXIETY   . Arthritis   . BACTEREMIA, MYCOBACTERIUM AVIUM COMPLEX   . BRONCHIECTASIS   . COLONIC POLYPS, HX OF   . Fundic gland polyps of stomach, benign   . GERD   . GOUT   . Headache   .  HYPERLIPIDEMIA   . HYPOTHYROIDISM   . Irritable bowel syndrome   . OSTEOPENIA    Past Surgical History:  Procedure Laterality Date  . ABDOMINAL HYSTERECTOMY    . BLADDER SUSPENSION    . CARPAL TUNNEL RELEASE Left   . COLONOSCOPY    . MASS EXCISION Right 08/10/2013   Procedure: DEBRIDE DISTAL INTERPHALANGEAL JOINT/EXCISION MUCOID CYST RIGHT LONG FINGER and right small finger;  Surgeon: RCammie Sickle, MD;  [facility]  Service: Orthopedics;  Laterality: Right;  . THYROIDECTOMY     Hurthele cell tumor  . UPPER GASTROINTESTINAL ENDOSCOPY     Family History  Problem Relation Age of Onset  . Cervical cancer Mother 882       died age 79 . Heart attack Father 82 . Colon cancer Neg Hx   . Esophageal cancer Neg Hx   . Stomach cancer Neg Hx   . Rectal cancer Neg Hx    Social History   Socioeconomic History  . Marital status: Married    Spouse name: Not on file  . Number of children: 3  . Years of education: Not on file  . Highest education level: Not on file  Occupational History  . Occupation: Retired    [employer] farm manager RETIRED  Social Needs  . Financial resource strain: Not hard at all  . Food  insecurity:    Worry: Never true    Inability: Never true  . Transportation needs:    Medical: No    Non-medical: No  Tobacco Use  . Smoking status: Never Smoker  . Smokeless tobacco: Never Used  . Tobacco comment: Married, Housewife  Substance and Sexual Activity  . Alcohol use: No    Alcohol/week: 0.0 standard drinks  . Drug use: No  . Sexual activity: Never  Lifestyle  . Physical activity:    Days per week: 3 days    Minutes per session: 30 min  . Stress: Not at all  Relationships  . Social connections:    Talks on phone: More than three times a week    Gets together: More than three times a week    Attends religious service: More than 4 times per year    Active member of club or organization: Yes    Attends meetings of clubs or  organizations: More than 4 times per year    Relationship status: Married  Other Topics Concern  . Not on file  Social History Narrative  . Not on file    Outpatient Encounter Medications as of 07/25/2018  Medication Sig  . calcium-vitamin D 250-100 MG-UNIT tablet Take 1 tablet by mouth 2 (two) times daily.  Marland Kitchen CALCIUM-VITAMIN D PO Take by mouth 1 day or 1 dose.  . diazepam (VALIUM) 5 MG tablet TAKE 1 TABLET BY MOUTH EVERY 12 HOURS AS NEEDED FOR ANXIETY  . levothyroxine (SYNTHROID, LEVOTHROID) 50 MCG tablet Take 1 tablet (50 mcg total) by mouth every other day.  . levothyroxine (SYNTHROID, LEVOTHROID) 75 MCG tablet TAKE 1 TABLET BY MOUTH EVERY OTHER DAY ALTERNATE WITH 50MCG  . loratadine (CLARITIN) 10 MG tablet Take 10 mg by mouth daily.  . Multiple Minerals-Vitamins (CALCIUM & VIT D3 BONE HEALTH PO) Take by mouth. Reported on 09/09/2015  . OMEGA 3 1000 MG CAPS Take by mouth daily.    Marland Kitchen omeprazole (PRILOSEC) 20 MG capsule Take 1 capsule (20 mg total) by mouth daily.  . Probiotic Product (PROBIOTIC-10 PO) Take by mouth 1 day or 1 dose.  . Psyllium (METAMUCIL PO) Take by mouth every other day.  . Sennosides (SENOKOT PO) Take by mouth.  . simvastatin (ZOCOR) 20 MG tablet TAKE 1 TABLET BY MOUTH AT BEDTIME  . [DISCONTINUED] acetaminophen-codeine (TYLENOL #3) 300-30 MG tablet Take 1 tablet by mouth every 8 (eight) hours as needed for moderate pain. (Patient not taking: Reported on 07/25/2018)   No facility-administered encounter medications on file as of 07/25/2018.     Activities of Daily Living In your present state of health, do you have any difficulty performing the following activities: 07/25/2018  Hearing? N  Vision? N  Difficulty concentrating or making decisions? N  Walking or climbing stairs? N  Dressing or bathing? N  Doing errands, shopping? N  Preparing Food and eating ? N  Using the Toilet? N  In the past six months, have you accidently leaked urine? N  Do you have problems with  loss of bowel control? N  Managing your Medications? N  Managing your Finances? N  Housekeeping or managing your Housekeeping? N  Some recent data might be hidden    Patient Care Team: Hoyt Koch, MD as PCP - General (Internal Medicine) Deneise Lever, MD (Pulmonary Disease) Gatha Mayer, MD (Gastroenterology) Lenard Simmer (Dentistry)    Assessment:   This is a routine wellness examination for [place]. Physical assessment deferred to PCP.  Exercise Activities and Dietary recommendations Current Exercise Habits: Home exercise routine, Time (Minutes): 30, Frequency (Times/Week): 3, Weekly Exercise (Minutes/Week): 90, Exercise limited by: orthopedic condition(s)  Diet (meal preparation, eat out, water intake, caffeinated beverages, dairy products, fruits and vegetables): in general, a "healthy" diet  , well balanced . eats a variety of fruits and vegetables daily, limits salt, fat/cholesterol, sugar,carbohydrates,caffeine.  Goals      Patient Stated   . maintain current health (pt-stated)      Other   . Patient Stated     I would like to travel to [place] more often to visit my family. Continue to exercise, eat healthy, stay as active and as independent as possible.    . Patient Stated     I would like to go travel [place] visit my family and relax.        Fall Risk Fall Risk  06/17/2017 06/16/2016 02/28/2016 05/21/2015 02/27/2015  Falls in the past year? No No No No Yes  Number falls in past yr: - - - - 1  Injury with Fall? - - - - Yes  Comment - - - - left hip bruise, did not go to ed  Risk Factor Category  - - - - High Fall Risk  Risk for fall due to : - - - - (No Data)  Risk for fall due to: Comment - - - - floor was slippery    Depression Screen PHQ 2/9 Scores 07/25/2018 06/17/2017 06/16/2016 02/28/2016  PHQ - 2 Score 0 1 0 0  PHQ- 9 Score - 2 - -     Cognitive Function MMSE - Mini Mental State Exam 06/17/2017  Orientation to time 5    Orientation to Place 5  Registration 3  Attention/ Calculation 5  Recall 2  Language- name 2 objects 2  Language- repeat 1  Language- follow 3 step command 3  Language- read & follow direction 1  Write a sentence 1  Copy design 1  Total score 29       Ad8 score reviewed for issues:  Issues making decisions: no  Less interest in hobbies / activities: no  Repeats questions, stories (family complaining): no  Trouble using ordinary gadgets (microwave, computer, phone):no  Forgets the month or year: no  Mismanaging finances: no  Remembering appts: no  Daily problems with thinking and/or memory: no Ad8 score is= 0  Immunization History  Administered Date(s) Administered  . Influenza Split 02/03/2011, 02/15/2012  . Influenza Whole 02/16/2008, 03/25/2009, 02/03/2010  . Influenza, High Dose Seasonal PF 02/11/2016  . Influenza,inj,Quad PF,6+ Mos 01/18/2013, 03/15/2014, 01/31/2015  . Influenza-Unspecified 01/31/2015, 04/01/2017, 03/12/2018  . Pneumococcal Conjugate-13 03/27/2014  . Pneumococcal Polysaccharide-23 08/14/2008  . Td 03/27/2009   Screening Tests Health Maintenance  Topic Date Due  . TETANUS/TDAP  03/28/2019  . INFLUENZA VACCINE  Completed  . DEXA SCAN  Completed  . PNA vac Low Risk Adult  Completed      Plan:     Reviewed health maintenance screenings with patient today and relevant education, vaccines, and/or referrals were provided.   Continue doing brain stimulating activities (puzzles, reading, adult coloring books, staying active) to keep memory sharp.   Continue to eat heart healthy diet (full of fruits, vegetables, whole grains, lean protein, water--limit salt, fat, and sugar intake) and increase physical activity as tolerated.  I have personally reviewed and noted the following in the patient's chart:   . Medical and social history . Use of alcohol, tobacco  or illicit drugs  . Current medications and supplements . Functional ability and  status . Nutritional status . Physical activity . Advanced directives . List of other physicians . Vitals . Screenings to include cognitive, depression, and falls . Referrals and appointments  In addition, I have reviewed and discussed with patient certain preventive protocols, quality metrics, and best practice recommendations. A written personalized care plan for preventive services as well as general preventive health recommendations were provided to patient.     Michiel Cowboy, RN  07/25/2018

## 2018-07-25 ENCOUNTER — Ambulatory Visit (INDEPENDENT_AMBULATORY_CARE_PROVIDER_SITE_OTHER): Payer: Medicare Other | Admitting: *Deleted

## 2018-07-25 VITALS — BP 102/66 | HR 77 | Resp 17 | Ht 63.0 in | Wt 112.0 lb

## 2018-07-25 DIAGNOSIS — Z0000 Encounter for general adult medical examination without abnormal findings: Secondary | ICD-10-CM

## 2018-07-25 NOTE — Progress Notes (Signed)
Medical screening examination/treatment/procedure(s) were performed by non-physician practitioner and as supervising physician I was immediately available for consultation/collaboration. I agree with above. Angeleena Dueitt A Angle Dirusso, MD 

## 2018-07-25 NOTE — Patient Instructions (Signed)
Continue doing brain stimulating activities (puzzles, reading, adult coloring books, staying active) to keep memory sharp.   Continue to eat heart healthy diet (full of fruits, vegetables, whole grains, lean protein, water--limit salt, fat, and sugar intake) and increase physical activity as tolerated.   Marilyn Blake , Thank you for taking time to come for your Medicare Wellness Visit. I appreciate your ongoing commitment to your health goals. Please review the following plan we discussed and let me know if I can assist you in the future.   These are the goals we discussed: Goals      Patient Stated   . maintain current health (pt-stated)      Other   . Patient Stated     I would like to travel to [place] more often to visit my family. Continue to exercise, eat healthy, stay as active and as independent as possible.    . Patient Stated     I would like to go travel [place] visit my family and relax.        This is a list of the screening recommended for you and due dates:  Health Maintenance  Topic Date Due  . Tetanus Vaccine  03/28/2019  . Flu Shot  Completed  . DEXA scan (bone density measurement)  Completed  . Pneumonia vaccines  Completed   Health Maintenance, Female Adopting a healthy lifestyle and getting preventive care can go a long way to promote health and wellness. Talk with your health care provider about what schedule of regular examinations is right for you. This is a good chance for you to check in with your provider about disease prevention and staying healthy. In between checkups, there are plenty of things you can do on your own. Experts have done a lot of research about which lifestyle changes and preventive measures are most likely to keep you healthy. Ask your health care provider for more information. Weight and diet Eat a healthy diet  Be sure to include plenty of vegetables, fruits, low-fat dairy products, and lean protein.  Do not eat  a lot of foods high in solid fats, added sugars, or salt.  Get regular exercise. This is one of the most important things you can do for your health. ? Most adults should exercise for at least 150 minutes each week. The exercise should increase your heart rate and make you sweat (moderate-intensity exercise). ? Most adults should also do strengthening exercises at least twice a week. This is in addition to the moderate-intensity exercise. Maintain a healthy weight  Body mass index (BMI) is a measurement that can be used to identify possible weight problems. It estimates body fat based on height and weight. Your health care provider can help determine your BMI and help you achieve or maintain a healthy weight.  For females 105 years of age and older: ? A BMI below 18.5 is considered underweight. ? A BMI of 18.5 to 24.9 is normal. ? A BMI of 25 to 29.9 is considered overweight. ? A BMI of 30 and above is considered obese. Watch levels of cholesterol and blood lipids  You should start having your blood tested for lipids and cholesterol at 79 years of age, then have this test every 5 years.  You may need to have your cholesterol levels checked more often if: ? Your lipid or cholesterol levels are high. ? You are older than 79 years of age. ? You are at high risk for heart disease. Cancer  screening Lung Cancer  Lung cancer screening is recommended for adults 27-67 years old who are at high risk for lung cancer because of a history of smoking.  A yearly low-dose CT scan of the lungs is recommended for people who: ? Currently smoke. ? Have quit within the past 15 years. ? Have at least a 30-pack-year history of smoking. A pack year is smoking an average of one pack of cigarettes a day for 1 year.  Yearly screening should continue until it has been 15 years since you quit.  Yearly screening should stop if you develop a health problem that would prevent you from having lung cancer  treatment. Breast Cancer  Practice breast self-awareness. This means understanding how your breasts normally appear and feel.  It also means doing regular breast self-exams. Let your health care provider know about any changes, no matter how small.  If you are in your 20s or 30s, you should have a clinical breast exam (CBE) by a health care provider every 1-3 years as part of a regular health exam.  If you are 33 or older, have a CBE every year. Also consider having a breast X-ray (mammogram) every year.  If you have a family history of breast cancer, talk to your health care provider about genetic screening.  If you are at high risk for breast cancer, talk to your health care provider about having an MRI and a mammogram every year.  Breast cancer gene (BRCA) assessment is recommended for women who have family members with BRCA-related cancers. BRCA-related cancers include: ? Breast. ? Ovarian. ? Tubal. ? Peritoneal cancers.  Results of the assessment will determine the need for genetic counseling and BRCA1 and BRCA2 testing. Cervical Cancer Your health care provider may recommend that you be screened regularly for cancer of the pelvic organs (ovaries, uterus, and vagina). This screening involves a pelvic examination, including checking for microscopic changes to the surface of your cervix (Pap test). You may be encouraged to have this screening done every 3 years, beginning at age 54.  For women ages 7-65, health care providers may recommend pelvic exams and Pap testing every 3 years, or they may recommend the Pap and pelvic exam, combined with testing for human papilloma virus (HPV), every 5 years. Some types of HPV increase your risk of cervical cancer. Testing for HPV may also be done on women of any age with unclear Pap test results.  Other health care providers may not recommend any screening for nonpregnant women who are considered low risk for pelvic cancer and who do not have  symptoms. Ask your health care provider if a screening pelvic exam is right for you.  If you have had past treatment for cervical cancer or a condition that could lead to cancer, you need Pap tests and screening for cancer for at least 20 years after your treatment. If Pap tests have been discontinued, your risk factors (such as having a new sexual partner) need to be reassessed to determine if screening should resume. Some women have medical problems that increase the chance of getting cervical cancer. In these cases, your health care provider may recommend more frequent screening and Pap tests. Colorectal Cancer  This type of cancer can be detected and often prevented.  Routine colorectal cancer screening usually begins at 79 years of age and continues through 79 years of age.  Your health care provider may recommend screening at an earlier age if you have risk factors for colon cancer.  Your health care provider may also recommend using home test kits to check for hidden blood in the stool.  A small camera at the end of a tube can be used to examine your colon directly (sigmoidoscopy or colonoscopy). This is done to check for the earliest forms of colorectal cancer.  Routine screening usually begins at age 94.  Direct examination of the colon should be repeated every 5-10 years through 79 years of age. However, you may need to be screened more often if early forms of precancerous polyps or small growths are found. Skin Cancer  Check your skin from head to toe regularly.  Tell your health care provider about any new moles or changes in moles, especially if there is a change in a mole's shape or color.  Also tell your health care provider if you have a mole that is larger than the size of a pencil eraser.  Always use sunscreen. Apply sunscreen liberally and repeatedly throughout the day.  Protect yourself by wearing long sleeves, pants, a wide-brimmed hat, and sunglasses whenever you are  outside. Heart disease, diabetes, and high blood pressure  High blood pressure causes heart disease and increases the risk of stroke. High blood pressure is more likely to develop in: ? People who have blood pressure in the high end of the normal range (130-139/85-89 mm Hg). ? People who are overweight or obese. ? People who are African American.  If you are 56-30 years of age, have your blood pressure checked every 3-5 years. If you are 67 years of age or older, have your blood pressure checked every year. You should have your blood pressure measured twice-once when you are at a hospital or clinic, and once when you are not at a hospital or clinic. Record the average of the two measurements. To check your blood pressure when you are not at a hospital or clinic, you can use: ? An automated blood pressure machine at a pharmacy. ? A home blood pressure monitor.  If you are between 63 years and 32 years old, ask your health care provider if you should take aspirin to prevent strokes.  Have regular diabetes screenings. This involves taking a blood sample to check your fasting blood sugar level. ? If you are at a normal weight and have a low risk for diabetes, have this test once every three years after 79 years of age. ? If you are overweight and have a high risk for diabetes, consider being tested at a younger age or more often. Preventing infection Hepatitis B  If you have a higher risk for hepatitis B, you should be screened for this virus. You are considered at high risk for hepatitis B if: ? You were born in a country where hepatitis B is common. Ask your health care provider which countries are considered high risk. ? Your parents were born in a high-risk country, and you have not been immunized against hepatitis B (hepatitis B vaccine). ? You have HIV or AIDS. ? You use needles to inject street drugs. ? You live with someone who has hepatitis B. ? You have had sex with someone who has  hepatitis B. ? You get hemodialysis treatment. ? You take certain medicines for conditions, including cancer, organ transplantation, and autoimmune conditions. Hepatitis C  Blood testing is recommended for: ? Everyone born from [birth year] through 1966. ? Anyone with known risk factors for hepatitis C. Sexually transmitted infections (STIs)  You should be screened for sexually transmitted infections (  STIs) including gonorrhea and chlamydia if: ? You are sexually active and are younger than 79 years of age. ? You are older than 79 years of age and your health care provider tells you that you are at risk for this type of infection. ? Your sexual activity has changed since you were last screened and you are at an increased risk for chlamydia or gonorrhea. Ask your health care provider if you are at risk.  If you do not have HIV, but are at risk, it may be recommended that you take a prescription medicine daily to prevent HIV infection. This is called pre-exposure prophylaxis (PrEP). You are considered at risk if: ? You are sexually active and do not regularly use condoms or know the HIV status of your partner(s). ? You take drugs by injection. ? You are sexually active with a partner who has HIV. Talk with your health care provider about whether you are at high risk of being infected with HIV. If you choose to begin PrEP, you should first be tested for HIV. You should then be tested every 3 months for as long as you are taking PrEP. Pregnancy  If you are premenopausal and you may become pregnant, ask your health care provider about preconception counseling.  If you may become pregnant, take 400 to 800 micrograms (mcg) of folic acid every day.  If you want to prevent pregnancy, talk to your health care provider about birth control (contraception). Osteoporosis and menopause  Osteoporosis is a disease in which the bones lose minerals and strength with aging. This can result in serious bone  fractures. Your risk for osteoporosis can be identified using a bone density scan.  If you are 39 years of age or older, or if you are at risk for osteoporosis and fractures, ask your health care provider if you should be screened.  Ask your health care provider whether you should take a calcium or vitamin D supplement to lower your risk for osteoporosis.  Menopause may have certain physical symptoms and risks.  Hormone replacement therapy may reduce some of these symptoms and risks. Talk to your health care provider about whether hormone replacement therapy is right for you. Follow these instructions at home:  Schedule regular health, dental, and eye exams.  Stay current with your immunizations.  Do not use any tobacco products including cigarettes, chewing tobacco, or electronic cigarettes.  If you are pregnant, do not drink alcohol.  If you are breastfeeding, limit how much and how often you drink alcohol.  Limit alcohol intake to no more than 1 drink per day for nonpregnant women. One drink equals 12 ounces of beer, 5 ounces of , or 1 ounces of hard liquor.  Do not use street drugs.  Do not share needles.  Ask your health care provider for help if you need support or information about quitting drugs.  Tell your health care provider if you often feel depressed.  Tell your health care provider if you have ever been abused or do not feel safe at home. This information is not intended to replace advice given to you by your health care provider. Make sure you discuss any questions you have with your health care provider. Document Released: 11/17/2010 Document Revised: 10/10/2015 Document Reviewed: 02/05/2015 Elsevier Interactive Patient Education  2019 [employer].

## 2018-08-05 IMAGING — CR DG CHEST 2V
2 series · 2 of 2 positions shown · non-contrast
Comparison: 01/31/2015.

CLINICAL DATA: Chest pain for the past week.

EXAM:
CHEST  2 VIEW

[w chest pa]
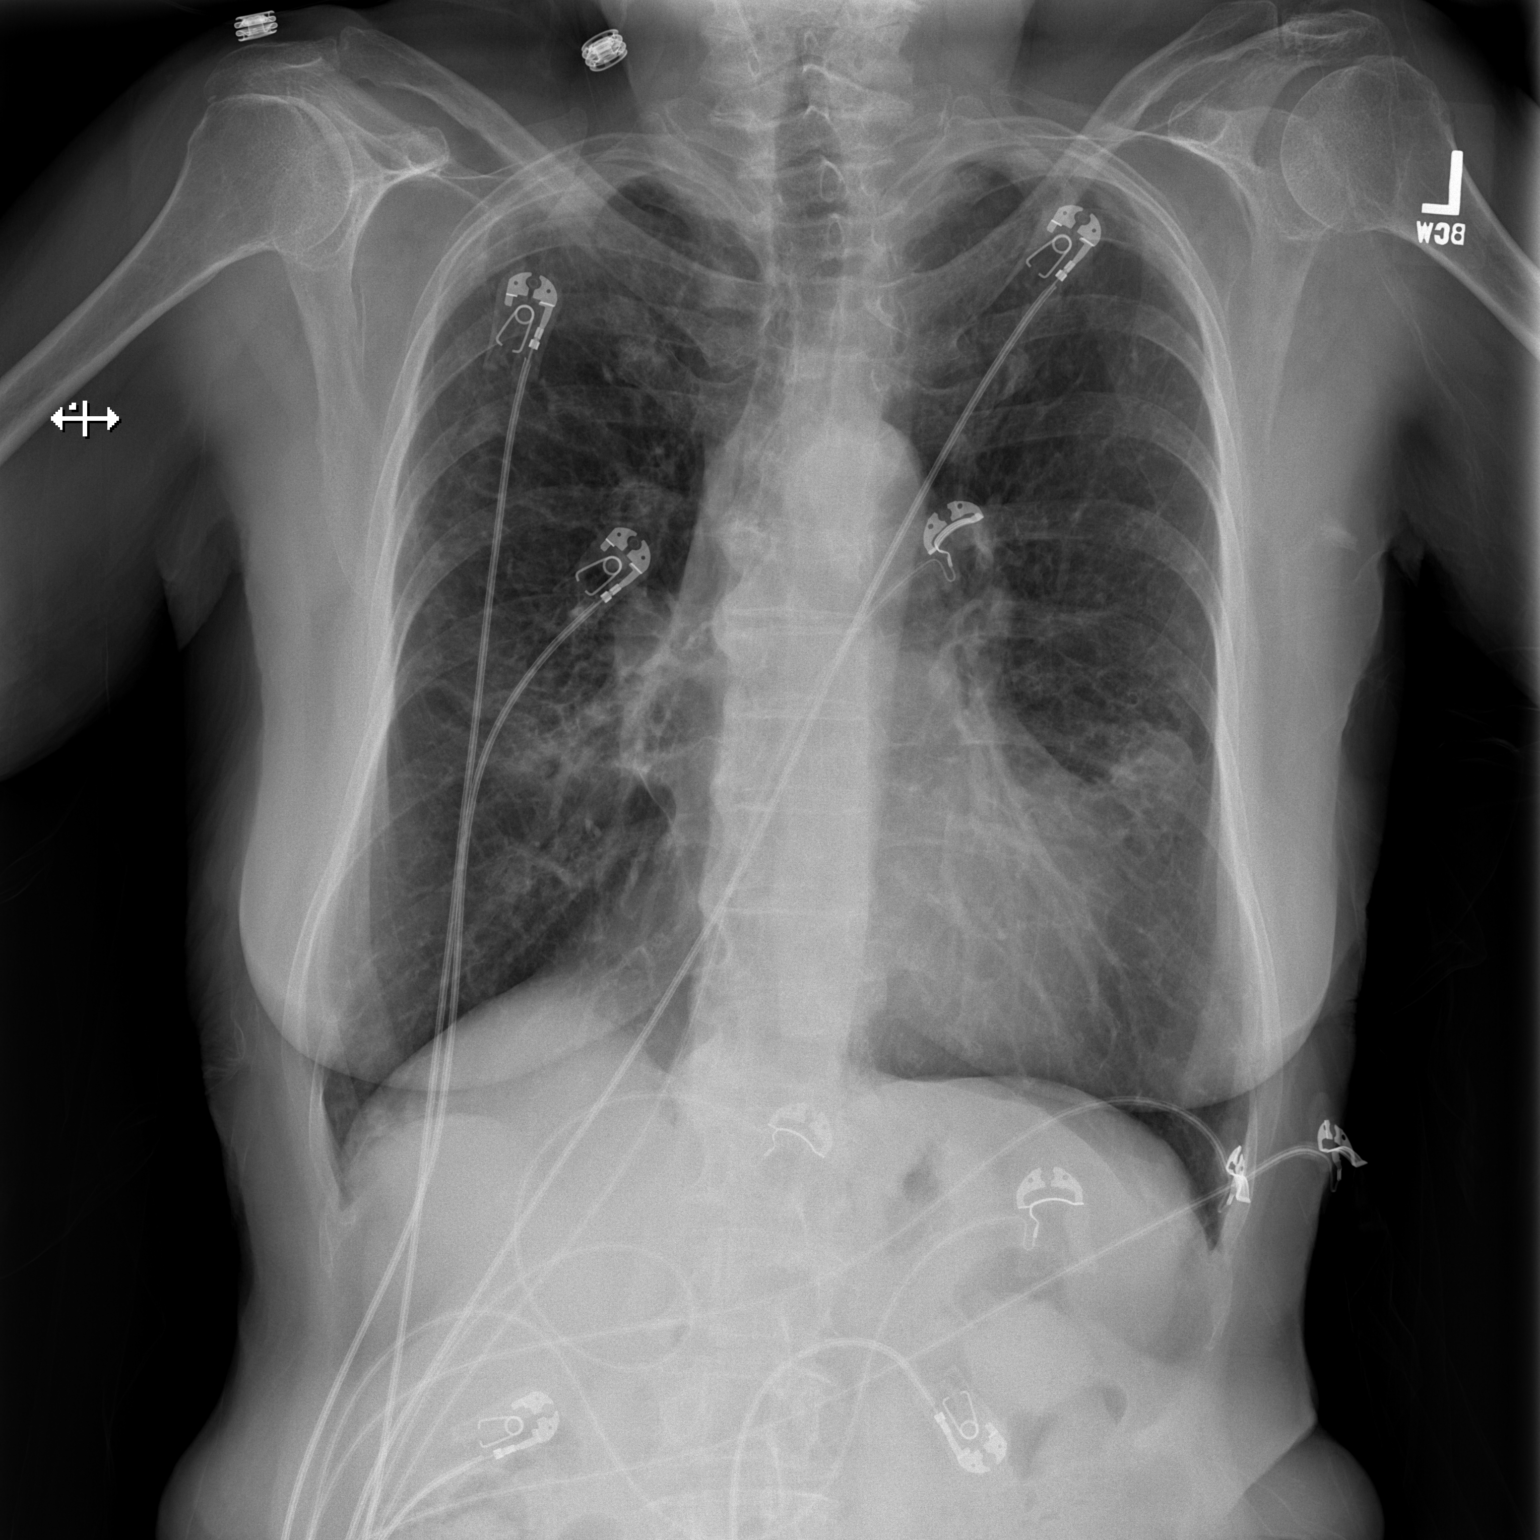

[w chest lat]
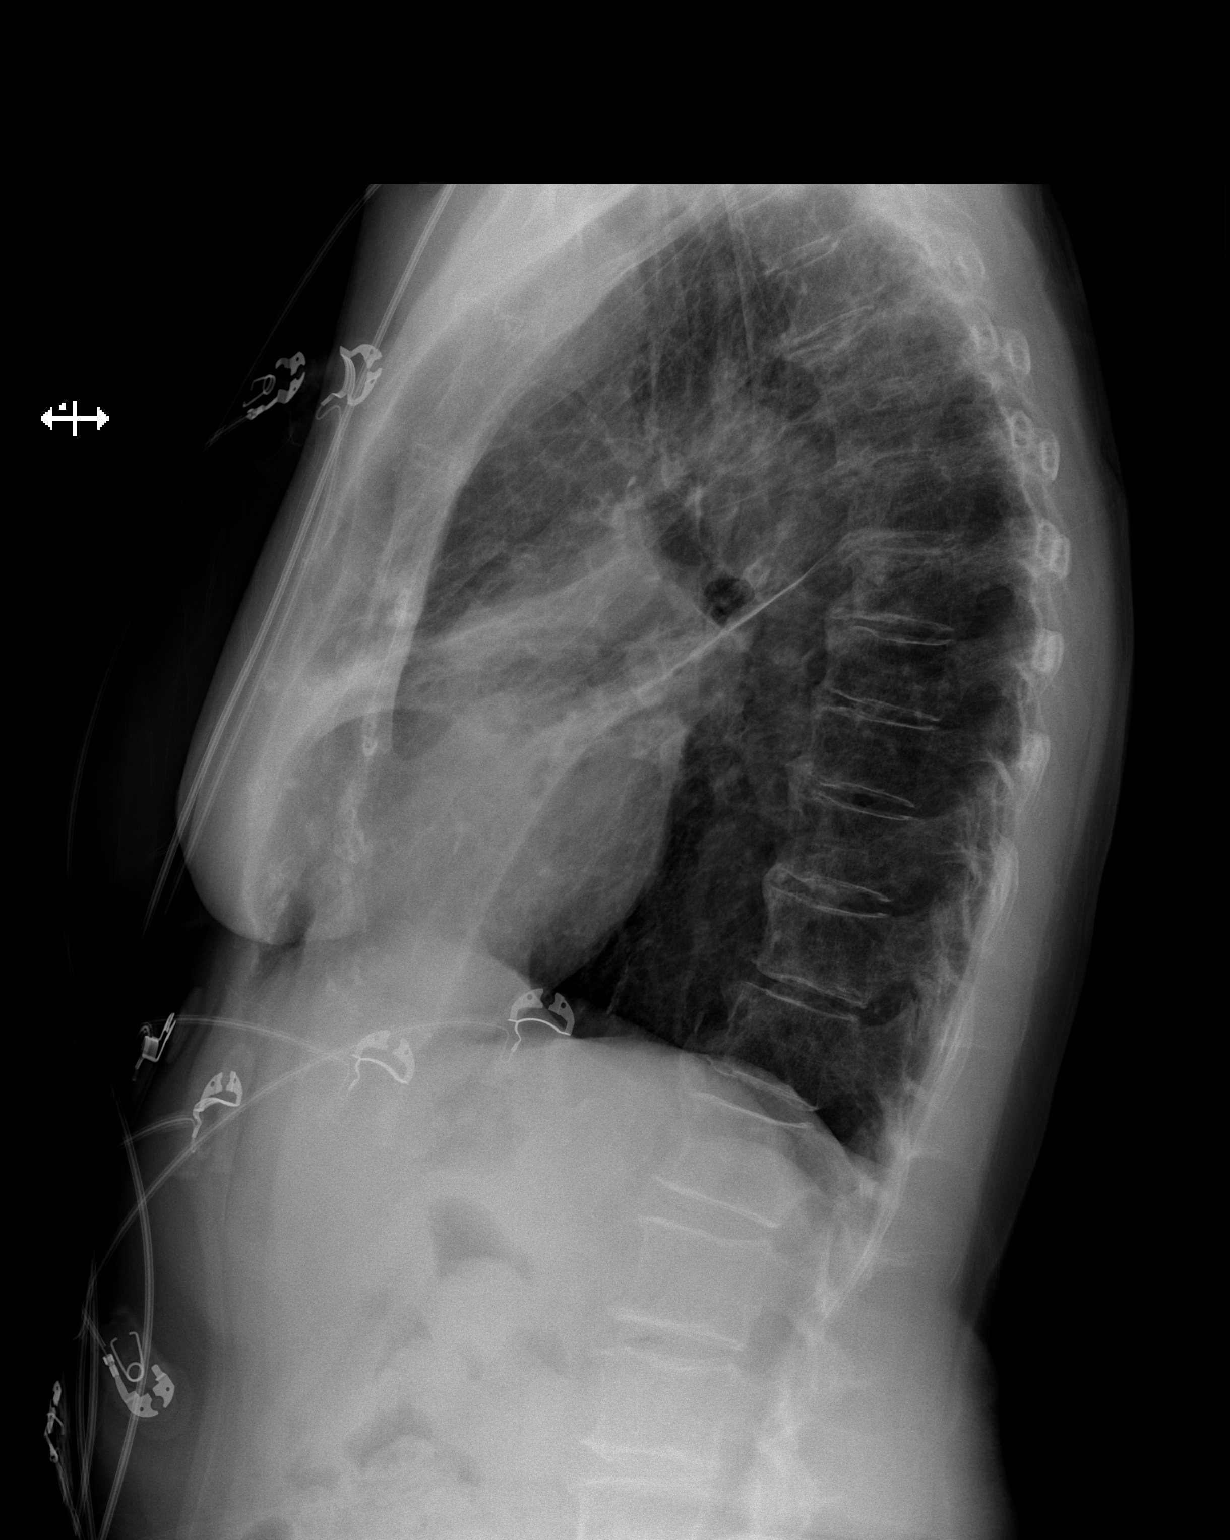

[2 of 2 positions shown; findings below may reference images not displayed]

FINDINGS: Normal sized heart. The lungs remain hyperexpanded. No significant
change in scarring in the lingula and right middle lobe. Thoracic
spine degenerative changes. Diffuse osteopenia.
IMPRESSION: No acute abnormality.

## 2018-08-18 ENCOUNTER — Other Ambulatory Visit: Payer: Self-pay | Admitting: Internal Medicine

## 2018-09-19 ENCOUNTER — Ambulatory Visit: Payer: Medicare Other | Admitting: Internal Medicine

## 2018-09-23 ENCOUNTER — Telehealth: Payer: Self-pay | Admitting: *Deleted

## 2018-09-23 DIAGNOSIS — R350 Frequency of micturition: Secondary | ICD-10-CM

## 2018-09-23 NOTE — Telephone Encounter (Signed)
Called patient back in response to VM she left nurse. Patient states that she would like to have an urology referral. Explaining that she has spoken to PCP about this in the past. She is experiencing more frequent urination but denies any discomfort or odor while urinating. Patient also states she does have some lower back pain and thinks this could be coming from her hip or doing yard work. Patient denies having any fever or chills.

## 2018-09-23 NOTE — Telephone Encounter (Signed)
Referral to urology placed

## 2018-09-23 NOTE — Telephone Encounter (Signed)
LVM informing patient of referral

## 2018-09-26 ENCOUNTER — Other Ambulatory Visit: Payer: Self-pay | Admitting: Internal Medicine

## 2018-09-26 NOTE — Telephone Encounter (Signed)
Control database checked last refill: 07/21/2018  LOV: acute 04/01/2018 [id number]

## 2018-10-03 ENCOUNTER — Telehealth: Payer: Self-pay | Admitting: Internal Medicine

## 2018-10-03 NOTE — Telephone Encounter (Signed)
Error

## 2018-10-04 DIAGNOSIS — N952 Postmenopausal atrophic vaginitis: Secondary | ICD-10-CM | POA: Diagnosis not present

## 2018-10-04 DIAGNOSIS — R351 Nocturia: Secondary | ICD-10-CM | POA: Diagnosis not present

## 2018-10-04 DIAGNOSIS — R350 Frequency of micturition: Secondary | ICD-10-CM | POA: Diagnosis not present

## 2018-10-04 DIAGNOSIS — N3941 Urge incontinence: Secondary | ICD-10-CM | POA: Diagnosis not present

## 2018-10-12 DIAGNOSIS — N1330 Unspecified hydronephrosis: Secondary | ICD-10-CM | POA: Diagnosis not present

## 2018-10-18 ENCOUNTER — Other Ambulatory Visit: Payer: Self-pay | Admitting: Internal Medicine

## 2018-10-24 DIAGNOSIS — H04123 Dry eye syndrome of bilateral lacrimal glands: Secondary | ICD-10-CM | POA: Diagnosis not present

## 2018-10-24 DIAGNOSIS — Z961 Presence of intraocular lens: Secondary | ICD-10-CM | POA: Diagnosis not present

## 2018-10-28 DIAGNOSIS — N130 Hydronephrosis with ureteropelvic junction obstruction: Secondary | ICD-10-CM | POA: Diagnosis not present

## 2018-10-31 DIAGNOSIS — J984 Other disorders of lung: Secondary | ICD-10-CM | POA: Diagnosis not present

## 2018-10-31 DIAGNOSIS — N1330 Unspecified hydronephrosis: Secondary | ICD-10-CM | POA: Diagnosis not present

## 2018-10-31 DIAGNOSIS — K7689 Other specified diseases of liver: Secondary | ICD-10-CM | POA: Diagnosis not present

## 2018-10-31 DIAGNOSIS — J479 Bronchiectasis, uncomplicated: Secondary | ICD-10-CM | POA: Diagnosis not present

## 2018-10-31 DIAGNOSIS — R918 Other nonspecific abnormal finding of lung field: Secondary | ICD-10-CM | POA: Diagnosis not present

## 2018-10-31 DIAGNOSIS — N130 Hydronephrosis with ureteropelvic junction obstruction: Secondary | ICD-10-CM | POA: Diagnosis not present

## 2018-11-25 DIAGNOSIS — H04123 Dry eye syndrome of bilateral lacrimal glands: Secondary | ICD-10-CM | POA: Diagnosis not present

## 2018-12-13 ENCOUNTER — Telehealth: Payer: Self-pay | Admitting: Internal Medicine

## 2018-12-14 NOTE — Telephone Encounter (Signed)
Will fill, can you see if she will schedule yearly or virtual to check in on health

## 2018-12-14 NOTE — Telephone Encounter (Signed)
Control database checked last refill: 09/26/2018 30 tabs LOV: acute 04/02/2019,  [id number]

## 2018-12-14 NOTE — Telephone Encounter (Signed)
Can you schedule patient for a yearly or at least a virtual to check in on health. Thank you

## 2018-12-15 NOTE — Telephone Encounter (Signed)
Patient states she just had oral surgery and that she is going out of town next week.  States she will call back to schedule in person or phone visit with Dr. Sharlet Salina.  Patient is not able to complete a virtual visit.

## 2018-12-15 NOTE — Telephone Encounter (Signed)
Noted thank you

## 2019-01-09 ENCOUNTER — Other Ambulatory Visit: Payer: Self-pay | Admitting: Internal Medicine

## 2019-01-10 ENCOUNTER — Ambulatory Visit (INDEPENDENT_AMBULATORY_CARE_PROVIDER_SITE_OTHER): Payer: Medicare Other | Admitting: Internal Medicine

## 2019-01-10 ENCOUNTER — Other Ambulatory Visit: Payer: Self-pay

## 2019-01-10 ENCOUNTER — Other Ambulatory Visit (INDEPENDENT_AMBULATORY_CARE_PROVIDER_SITE_OTHER): Payer: Medicare Other

## 2019-01-10 ENCOUNTER — Encounter: Payer: Self-pay | Admitting: Internal Medicine

## 2019-01-10 VITALS — BP 106/70 | HR 72 | Temp 98.2°F | Ht 63.0 in | Wt 112.0 lb

## 2019-01-10 DIAGNOSIS — E039 Hypothyroidism, unspecified: Secondary | ICD-10-CM

## 2019-01-10 DIAGNOSIS — F419 Anxiety disorder, unspecified: Secondary | ICD-10-CM | POA: Diagnosis not present

## 2019-01-10 DIAGNOSIS — F5105 Insomnia due to other mental disorder: Secondary | ICD-10-CM

## 2019-01-10 DIAGNOSIS — E782 Mixed hyperlipidemia: Secondary | ICD-10-CM

## 2019-01-10 LAB — COMPREHENSIVE METABOLIC PANEL
ALT: 19 U/L (ref 0–35)
AST: 26 U/L (ref 0–37)
Albumin: 4.6 g/dL (ref 3.5–5.2)
Alkaline Phosphatase: 97 U/L (ref 39–117)
BUN: 14 mg/dL (ref 6–23)
CO2: 32 mEq/L (ref 19–32)
Calcium: 9.8 mg/dL (ref 8.4–10.5)
Chloride: 99 mEq/L (ref 96–112)
Creatinine, Ser: 0.74 mg/dL (ref 0.40–1.20)
GFR: 75.6 mL/min (ref >60.00)
Glucose, Bld: 94 mg/dL (ref 70–99)
Potassium: 4.3 mEq/L (ref 3.5–5.1)
Sodium: 138 mEq/L (ref 135–145)
Total Bilirubin: 0.4 mg/dL (ref 0.2–1.2)
Total Protein: 8 g/dL (ref 6.0–8.3)

## 2019-01-10 LAB — CBC
HCT: 39.8 % (ref 36.0–46.0)
Hemoglobin: 13.4 g/dL (ref 12.0–15.0)
MCHC: 33.6 g/dL (ref 30.0–36.0)
MCV: 94.5 fl (ref 78.0–100.0)
Platelets: 216 10*3/uL (ref 150.0–400.0)
RBC: 4.21 Mil/uL (ref 3.87–5.11)
RDW: 13.6 % (ref 11.5–15.5)
WBC: 6.4 10*3/uL (ref 4.0–10.5)

## 2019-01-10 LAB — LIPID PANEL
Cholesterol: 202 mg/dL — ABNORMAL HIGH (ref 0–200)
HDL: 66.1 mg/dL (ref >39.00)
LDL Cholesterol: 117 mg/dL — ABNORMAL HIGH (ref 0–99)
NonHDL: 136.02
Total CHOL/HDL Ratio: 3
Triglycerides: 96 mg/dL (ref 0.0–149.0)
VLDL: 19.2 mg/dL (ref 0.0–40.0)

## 2019-01-10 LAB — T4, FREE: Free T4: 1.08 ng/dL (ref 0.60–1.60)

## 2019-01-10 LAB — TSH: TSH: 5.1 u[IU]/mL — ABNORMAL HIGH (ref 0.35–4.50)

## 2019-01-10 MED ORDER — LEVOTHYROXINE SODIUM 50 MCG PO TABS: 50.0000 ug | ORAL_TABLET | ORAL | 3 refills | Status: DC

## 2019-01-10 MED ORDER — TRIAMCINOLONE ACETONIDE 0.1 % EX CREA: 1.0000 "application " | TOPICAL_CREAM | Freq: Two times a day (BID) | CUTANEOUS | 3 refills | Status: DC

## 2019-01-10 MED ORDER — SIMVASTATIN 20 MG PO TABS: 20.0000 mg | ORAL_TABLET | Freq: Every day | ORAL | 3 refills | Status: DC

## 2019-01-10 MED ORDER — LEVOTHYROXINE SODIUM 75 MCG PO TABS: ORAL_TABLET | ORAL | 3 refills | Status: DC

## 2019-01-10 NOTE — Patient Instructions (Signed)
We will check the labs today and send you the results.    

## 2019-01-10 NOTE — Progress Notes (Signed)
   Subjective:   Patient ID: Marilyn Blake, female    DOB: [birth year], 78 y.o.   MRN: [id number]  HPI The patient is a 78 YO female coming in for follow up of thyroid (taking synthroid 75 and 50 mcg alternating, denies heat or cold intolerance, denies side effects or missing meds), and cholesterol (taking simvastatin and denies side effects, denies chest pains or stroke symptoms), and anxiety/insomnia (taking valium rarely for this, usually will take 1/2 pill when needed, denies falls, denies memory changes, denies side effects or increasing frequency of dosing, denies depression)  Review of Systems  Constitutional: Negative.   HENT: Negative.   Eyes: Negative.   Respiratory: Negative for cough, chest tightness and shortness of breath.   Cardiovascular: Negative for chest pain, palpitations and leg swelling.  Gastrointestinal: Negative for abdominal distention, abdominal pain, constipation, diarrhea, nausea and vomiting.  Musculoskeletal: Negative.   Skin: Negative.   Neurological: Negative.   Psychiatric/Behavioral: Negative.     Objective:  Physical Exam Constitutional:      Appearance: She is well-developed.  HENT:     Head: Normocephalic and atraumatic.  Neck:     Musculoskeletal: Normal range of motion.  Cardiovascular:     Rate and Rhythm: Normal rate and regular rhythm.  Pulmonary:     Effort: Pulmonary effort is normal. No respiratory distress.     Breath sounds: Normal breath sounds. No wheezing or rales.  Abdominal:     General: Bowel sounds are normal. There is no distension.     Palpations: Abdomen is soft.     Tenderness: There is no abdominal tenderness. There is no rebound.  Skin:    General: Skin is warm and dry.  Neurological:     Mental Status: She is alert and oriented to person, place, and time.     Coordination: Coordination normal.     Vitals:   01/10/19 1111  BP: 106/70  Pulse: 72  Temp: 98.2 F (36.8 C)  TempSrc: Oral  SpO2: 94%  Weight: 112  lb (50.8 kg)  Height: 5\' 3"  (1.6 m)    Assessment & Plan:

## 2019-01-13 NOTE — Assessment & Plan Note (Signed)
Taking valium as needed. No increase in dosing or quantity needed. Refill if needed. She is again counseled on the increased risk of memory changes and falls with this medication and agrees to continue given increase in QOL.

## 2019-01-13 NOTE — Assessment & Plan Note (Signed)
Checking TSH and free T4 and adjust synthroid 75 mcg and 50 mcg alternating if needed.

## 2019-01-13 NOTE — Assessment & Plan Note (Signed)
Checking lipid panel and adjust simvastatin as needed.  

## 2019-01-20 ENCOUNTER — Other Ambulatory Visit: Payer: Self-pay

## 2019-01-20 ENCOUNTER — Telehealth: Payer: Self-pay | Admitting: Internal Medicine

## 2019-01-20 DIAGNOSIS — R519 Headache, unspecified: Secondary | ICD-10-CM

## 2019-01-20 DIAGNOSIS — Z20822 Contact with and (suspected) exposure to covid-19: Secondary | ICD-10-CM

## 2019-01-20 DIAGNOSIS — R6889 Other general symptoms and signs: Secondary | ICD-10-CM | POA: Diagnosis not present

## 2019-01-20 NOTE — Telephone Encounter (Signed)
Ok to order Covid nasal swab  Suggest she use sudafed decongestant and saline nasal rinses for possible early sinus infection. Let us know if she thinks she needs an antibiotic.

## 2019-01-20 NOTE — Telephone Encounter (Signed)
Spoke with pt. She is aware of CY's recommendations. Order has been placed for COVID test. Nothing further was needed.

## 2019-01-20 NOTE — Telephone Encounter (Signed)
Primary Pulmonologist: Dr. Annamaria Boots Last office visit and with whom: 09/16/2017 What do we see them for (pulmonary problems): Asthma  Reason for call:  States that she has been having a headache and sinus issues off and on for over a week. Denies shortness of breath, coughing, fever, sick contacts or recent travel. She is wondering if she should be tested for COVID.  In the last month, have you been in contact with someone who was confirmed or suspected to have Conoravirus / COVID-19?  No  Do you have any of the following symptoms developed in the last 30 days? Fever: No Cough: No Shortness of breath: No  When did your symptoms start?  Over a week ago  If the patient has a fever, what is the last reading?  (use n/a if patient denies fever)  N/A  CY - please advise. Thanks.

## 2019-01-21 LAB — NOVEL CORONAVIRUS, NAA: SARS-CoV-2, NAA: NOT DETECTED

## 2019-01-24 ENCOUNTER — Telehealth: Payer: Self-pay

## 2019-01-24 NOTE — Telephone Encounter (Signed)
Spoke with the pt and informed her the covid test from 01/20/2019 was negative  She verbalized understanding  Nothing further needed

## 2019-01-25 ENCOUNTER — Other Ambulatory Visit: Payer: Self-pay

## 2019-01-25 ENCOUNTER — Ambulatory Visit (INDEPENDENT_AMBULATORY_CARE_PROVIDER_SITE_OTHER): Payer: Medicare Other | Admitting: Internal Medicine

## 2019-01-25 ENCOUNTER — Ambulatory Visit (INDEPENDENT_AMBULATORY_CARE_PROVIDER_SITE_OTHER): Payer: Medicare Other

## 2019-01-25 ENCOUNTER — Encounter: Payer: Self-pay | Admitting: Internal Medicine

## 2019-01-25 VITALS — BP 116/60 | HR 77 | Temp 97.6°F | Ht 63.0 in | Wt 111.4 lb

## 2019-01-25 DIAGNOSIS — Z23 Encounter for immunization: Secondary | ICD-10-CM | POA: Diagnosis not present

## 2019-01-25 DIAGNOSIS — J479 Bronchiectasis, uncomplicated: Secondary | ICD-10-CM | POA: Diagnosis not present

## 2019-01-25 DIAGNOSIS — J441 Chronic obstructive pulmonary disease with (acute) exacerbation: Secondary | ICD-10-CM

## 2019-01-25 NOTE — Patient Instructions (Signed)
Order- Flu vax- senior  Order- CXR   Dx bronchiectasis without exacerbation  Please call if we can help

## 2019-01-25 NOTE — Progress Notes (Signed)
Patient ID: Marilyn Blake, female    DOB: Jul 01, 1940, 78 y.o.   MRN: [id number]  HPI 02/03/2011-78 year old female never smoker followed for bronchiectasis, history of MAIC bronchitis, allergic rhinitis.   --------------------------------------------------------------------------------------   09/16/2017- 78 year old female never smoker followed for bronchiectasis, recurrent MAIC bronchitis, allergic rhinitis, insomnia, complicated by hypothyroid, IBS ----Bronchitis: Pt states she wears mask when outdoors but notes-vertigo, weakness, and wheezing that is getting worse.  In the last few weeks with heavy pollen levels she has been noticing increased chest congestion, shortness of breath and some wheeze with dry cough.  Denies sore throat, fever or sick exposure. She asks about CXR. CXR 11/12/2016- Normal sized heart. The lungs remain hyperexpanded. No significant change in scarring in the lingula and right middle lobe. Thoracic spine degenerative changes. Diffuse osteopenia.  01/25/2019-  78 year old female never smoker followed for bronchiectasis, recurrent MAIC bronchitis, allergic rhinitis, insomnia, complicated by hypothyroid, IBS pt reports recent cold "spell", recently tested neg for covid; reports mild congestion, runny nose, postnasal drip, and headache -----pt reports recent cold "spell", recently tested neg for covid; reports mild congestion, runny nose, postnasal drip, and headache 1 week chills, HA, malaise-  Felt like a viral illness. Mild dry cough. Chronic SOB, mainly exertion. CXR w rib details ?'d ILD changes in January- discussed.  Seen at [facility] and doctor there concerned about appearance of lungs on abd CT.  CXR 09/16/17 No acute abnormality. Stable changes of COPD and areas of parenchymal scarring and cylindrical bronchiectasis. CXR/ L ribs 06/09/18- 1. Chronic interstitial lung disease and bilateral pleuroparenchymal thickening. Superimposed active interstitial  process including pneumonitis could not be excluded. 2. No acute bony abnormality. No evidence of displaced rib fracture or pneumothorax.  Review of Systems- see HPI Constitutional:   No-   weight loss, +night sweats, , chills, +fatigue, lassitude. HEENT:   +  headaches, difficulty swallowing, tooth/dental problems, sore throat,       No-  sneezing, itching, ear ache, +nasal congestion, post nasal drip,  CV:  atypicalchest pain, orthopnea, PND, swelling in lower extremities, anasarca,dizziness, palpitations Resp: + shortness of breath with exertion or at rest.             + productive cough,   non-productive cough,  no- coughing up of blood.              No- change in color of mucus.  No- wheezing.   Skin: No-   rash or lesions. GI:  No-   heartburn, indigestion, abdominal pain, nausea, vomiting,  GU:  MS:  No-   joint pain or swelling. . Neuro- grossly normal to observation,   Psych:  No- change in mood or affect. No depression or anxiety.  No memory loss.  Objective:   Physical Exam General- Alert, Oriented, Affect-appropriate, Distress- none acute, petite/ thin lady Skin- rash-none, lesions- none, excoriation- none Lymphadenopathy- none Head- atraumatic            Eyes- Gross vision intact, PERRLA, conjunctivae clear secretions            Ears- Hearing, canals- normal            Nose- Clear, No-Septal dev, mucus, polyps, erosion, perforation             Throat- Mallampati II , mucosa clear- not red , drainage- none, tonsils- atrophic, Neck- flexible , trachea midline, no stridor , thyroid nl, carotid no bruit Chest - symmetrical excursion , unlabored  Heart/CV- RRR , no murmur , no gallop  , no rub, nl s1 s2                           - JVD- none , edema- none, stasis changes- none, varices- none           Lung-  Unlabored,+ few crackles,  wheeze-none, cough- none , dullness-none, rub-none           Chest wall-  Abd- Br/ Gen/ Rectal- Not done, not indicated Extrem-  cyanosis- none, clubbing, none, atrophy- none, strength- nl Neuro- grossly intact to observation

## 2019-01-27 NOTE — Assessment & Plan Note (Addendum)
Mild viral syndrome, URI pattern. Tested neg Covid.  Plan- conservative- fluids, rest symptomatic Rx. Retest for Covid if gets worse.

## 2019-01-27 NOTE — Assessment & Plan Note (Signed)
Overall pattern has been stable Plan- CXR, Flu vax

## 2019-02-13 ENCOUNTER — Other Ambulatory Visit: Payer: Self-pay | Admitting: Internal Medicine

## 2019-03-17 ENCOUNTER — Other Ambulatory Visit: Payer: Self-pay | Admitting: Internal Medicine

## 2019-03-17 NOTE — Telephone Encounter (Signed)
[place] Controlled Database Checked Last filled: 12/14/18 # 30 LOV w/you: 01/10/19 Next appt w/you: 07/25/19

## 2019-04-11 ENCOUNTER — Telehealth: Payer: Self-pay | Admitting: Internal Medicine

## 2019-04-11 NOTE — Telephone Encounter (Signed)
Medication Refill - Medication: levothyroxine 50 mcg   Has the patient contacted their pharmacy? Yes.   (Agent: If no, request that the patient contact the pharmacy for the refill.) (Agent: If yes, when and what did the pharmacy advise?)  Preferred Pharmacy (with phone number or street name): [facility] [address]   Pt takes 2 doses of this med.  Agent: Please be advised that RX refills may take up to 3 business days. We ask that you follow-up with your pharmacy.

## 2019-04-12 ENCOUNTER — Other Ambulatory Visit: Payer: Self-pay | Admitting: Internal Medicine

## 2019-06-15 ENCOUNTER — Other Ambulatory Visit: Payer: Self-pay | Admitting: Internal Medicine

## 2019-06-15 DIAGNOSIS — Z1231 Encounter for screening mammogram for malignant neoplasm of breast: Secondary | ICD-10-CM

## 2019-07-11 ENCOUNTER — Telehealth: Payer: Self-pay

## 2019-07-11 NOTE — Telephone Encounter (Signed)
Form was received and given to PCP

## 2019-07-11 NOTE — Telephone Encounter (Signed)
New message  Checking to see if efax was sent over yesterday.    Fax # [phone]

## 2019-07-20 ENCOUNTER — Other Ambulatory Visit: Payer: Self-pay

## 2019-07-20 ENCOUNTER — Ambulatory Visit (INDEPENDENT_AMBULATORY_CARE_PROVIDER_SITE_OTHER): Payer: Medicare Other | Admitting: Internal Medicine

## 2019-07-20 ENCOUNTER — Encounter: Payer: Self-pay | Admitting: Internal Medicine

## 2019-07-20 VITALS — BP 112/68 | HR 74 | Temp 97.7°F | Ht 63.0 in | Wt 111.2 lb

## 2019-07-20 DIAGNOSIS — R1084 Generalized abdominal pain: Secondary | ICD-10-CM | POA: Diagnosis not present

## 2019-07-20 DIAGNOSIS — E039 Hypothyroidism, unspecified: Secondary | ICD-10-CM

## 2019-07-20 DIAGNOSIS — R0789 Other chest pain: Secondary | ICD-10-CM

## 2019-07-20 DIAGNOSIS — H9202 Otalgia, left ear: Secondary | ICD-10-CM

## 2019-07-20 LAB — COMPREHENSIVE METABOLIC PANEL
ALT: 13 U/L (ref 0–35)
AST: 23 U/L (ref 0–37)
Albumin: 4.1 g/dL (ref 3.5–5.2)
Alkaline Phosphatase: 103 U/L (ref 39–117)
BUN: 16 mg/dL (ref 6–23)
CO2: 33 mEq/L — ABNORMAL HIGH (ref 19–32)
Calcium: 9.7 mg/dL (ref 8.4–10.5)
Chloride: 100 mEq/L (ref 96–112)
Creatinine, Ser: 0.76 mg/dL (ref 0.40–1.20)
GFR: 73.21 mL/min (ref >60.00)
Glucose, Bld: 89 mg/dL (ref 70–99)
Potassium: 3.8 mEq/L (ref 3.5–5.1)
Sodium: 138 mEq/L (ref 135–145)
Total Bilirubin: 0.4 mg/dL (ref 0.2–1.2)
Total Protein: 7.9 g/dL (ref 6.0–8.3)

## 2019-07-20 LAB — TSH: TSH: 4.49 u[IU]/mL (ref 0.35–4.50)

## 2019-07-20 LAB — CBC
HCT: 39.3 % (ref 36.0–46.0)
Hemoglobin: 13.1 g/dL (ref 12.0–15.0)
MCHC: 33.3 g/dL (ref 30.0–36.0)
MCV: 95.9 fl (ref 78.0–100.0)
Platelets: 204 10*3/uL (ref 150.0–400.0)
RBC: 4.1 Mil/uL (ref 3.87–5.11)
RDW: 13.5 % (ref 11.5–15.5)
WBC: 7.2 10*3/uL (ref 4.0–10.5)

## 2019-07-20 LAB — T4, FREE: Free T4: 1.03 ng/dL (ref 0.60–1.60)

## 2019-07-20 LAB — LIPASE: Lipase: 26 U/L (ref 11.0–59.0)

## 2019-07-20 MED ORDER — CLOTRIMAZOLE-BETAMETHASONE 1-0.05 % EX CREA: 1.0000 "application " | TOPICAL_CREAM | Freq: Two times a day (BID) | CUTANEOUS | 0 refills | Status: DC

## 2019-07-20 MED ORDER — DICYCLOMINE HCL 10 MG PO CAPS: 10.0000 mg | ORAL_CAPSULE | Freq: Three times a day (TID) | ORAL | 0 refills | Status: DC

## 2019-07-20 NOTE — Patient Instructions (Addendum)
The EKG of the heart is normal and not changed from before.   We will check the labs today and will try a medicine for the cramps called bentyl to use if needed for pain.  If we do not find anything on the labs we will get an ultrasound of the stomach to look for any problems.

## 2019-07-20 NOTE — Progress Notes (Signed)
   Subjective:   Patient ID: Marilyn Blake, female    DOB: August 12, 1940, 79 y.o.   MRN: [id number]  HPI The patient is an 79 YO female coming in for chest pressure (having some pain in the upper stomach and pressure in the chest, lasts for awhile like hours at a time, denies pain radiating anywhere except upper abdomen, sometimes triggered by foods but sometimes random, not usually with activity) and left ear pain (redness on the ear lobe itself, gets sore and more red sometimes, no ear discharge or hearing changes, no sinus problems), and stomach pain (more problems with this lately, denies food changes, having cramping pains in the stomach with eating, denies vomiting but no nausea, prior IBS with constipation and she is constipated lately).   Review of Systems  Constitutional: Negative.   HENT: Positive for ear pain.   Eyes: Negative.   Respiratory: Positive for chest tightness. Negative for cough and shortness of breath.   Cardiovascular: Positive for chest pain. Negative for palpitations and leg swelling.  Gastrointestinal: Positive for abdominal pain and constipation. Negative for abdominal distention, anal bleeding, blood in stool, diarrhea, nausea, rectal pain and vomiting.  Musculoskeletal: Negative.   Skin: Negative.   Neurological: Negative.   Psychiatric/Behavioral: Negative.     Objective:  Physical Exam Constitutional:      Appearance: She is well-developed.  HENT:     Head: Normocephalic and atraumatic.     Comments: Left earlobe with redness, left TM and canal normal Cardiovascular:     Rate and Rhythm: Normal rate and regular rhythm.  Pulmonary:     Effort: Pulmonary effort is normal. No respiratory distress.     Breath sounds: Normal breath sounds. No wheezing or rales.  Abdominal:     General: Bowel sounds are normal. There is no distension.     Palpations: Abdomen is soft.     Tenderness: There is abdominal tenderness. There is no rebound.     Comments: Diffuse  mild tenderness  Musculoskeletal:     Cervical back: Normal range of motion.  Skin:    General: Skin is warm and dry.  Neurological:     Mental Status: She is alert and oriented to person, place, and time.     Coordination: Coordination normal.     Vitals:   07/20/19 1422  BP: 112/68  Pulse: 74  Temp: 97.7 F (36.5 C)  TempSrc: Oral  SpO2: 94%  Weight: 111 lb 4 oz (50.5 kg)  Height: 5\' 3"  (1.6 m)   EKG: Rate 66, axis normal, intervals normal, sinus, no st or t wave changes, no change when compared to 2019  This visit occurred during the SARS-CoV-2 public health emergency.  Safety protocols were in place, including screening questions prior to the visit, additional usage of staff PPE, and extensive cleaning of exam room while observing appropriate contact time as indicated for disinfecting solutions.   Assessment & Plan:

## 2019-07-21 DIAGNOSIS — H9202 Otalgia, left ear: Secondary | ICD-10-CM | POA: Insufficient documentation

## 2019-07-21 NOTE — Assessment & Plan Note (Signed)
Rx lotrisone cream to use to heal this.

## 2019-07-21 NOTE — Assessment & Plan Note (Signed)
EKG done in office without changes, suspect this is coming from abdominal pain rather than cardiac.

## 2019-07-21 NOTE — Assessment & Plan Note (Signed)
Checking CBC, CMP, lipase. Rx bentyl to see if this helps. Advised to work on the constipation.

## 2019-07-21 NOTE — Assessment & Plan Note (Signed)
Checking TSH and free T4 to ensure this is not related to the chest discomfort.

## 2019-07-25 ENCOUNTER — Ambulatory Visit (INDEPENDENT_AMBULATORY_CARE_PROVIDER_SITE_OTHER): Payer: Medicare Other | Admitting: Podiatry

## 2019-07-25 ENCOUNTER — Other Ambulatory Visit: Payer: Self-pay

## 2019-07-25 ENCOUNTER — Ambulatory Visit: Payer: Medicare Other

## 2019-07-25 ENCOUNTER — Ambulatory Visit (INDEPENDENT_AMBULATORY_CARE_PROVIDER_SITE_OTHER): Payer: Medicare Other

## 2019-07-25 ENCOUNTER — Encounter: Payer: Self-pay | Admitting: Podiatry

## 2019-07-25 VITALS — Temp 97.7°F

## 2019-07-25 DIAGNOSIS — M722 Plantar fascial fibromatosis: Secondary | ICD-10-CM

## 2019-07-25 DIAGNOSIS — M7672 Peroneal tendinitis, left leg: Secondary | ICD-10-CM

## 2019-07-25 NOTE — Telephone Encounter (Signed)
PCP declined. She does not do genetic testing. Form was faxed back with that notation.

## 2019-07-25 NOTE — Telephone Encounter (Signed)
New Message:   Alyssa from [facility] is calling to follow up on a fax that was sent in February and was awaiting Dr signature. Needs to be faxed back to [phone]. Please advise.

## 2019-07-26 ENCOUNTER — Ambulatory Visit
Admission: RE | Admit: 2019-07-26 | Discharge: 2019-07-26 | Disposition: A | Discharge status: Home / Self Care | Payer: Medicare Other | Source: Ambulatory Visit | Attending: Internal Medicine | Admitting: Internal Medicine

## 2019-07-26 DIAGNOSIS — Z1231 Encounter for screening mammogram for malignant neoplasm of breast: Secondary | ICD-10-CM | POA: Diagnosis not present

## 2019-07-26 NOTE — Progress Notes (Signed)
Subjective:  Patient ID: Marilyn Blake, female    DOB: [birth year],  MRN: [id number] HPI Chief Complaint  Patient presents with  . Ankle Pain    Lateral left ankle - aching x several months, seems puffy some days, sneakers rub, tried Ibuprofen, [employer] shoes, stretching, pain is now constant  . New Patient (Initial Visit)    Est pt 2017    79 y.o. female presents with the above complaint.   ROS: Denies fever chills nausea vomiting muscle aches pains calf pain back pain chest pain shortness of breath.  Past Medical History:  Diagnosis Date  . ALLERGIC RHINITIS   . ANXIETY   . Arthritis   . BACTEREMIA, MYCOBACTERIUM AVIUM COMPLEX   . BRONCHIECTASIS   . COLONIC POLYPS, HX OF   . Fundic gland polyps of stomach, benign   . GERD   . GOUT   . Headache   . HYPERLIPIDEMIA   . HYPOTHYROIDISM   . Irritable bowel syndrome   . OSTEOPENIA    Past Surgical History:  Procedure Laterality Date  . ABDOMINAL HYSTERECTOMY    . BLADDER SUSPENSION    . CARPAL TUNNEL RELEASE Left   . COLONOSCOPY    . MASS EXCISION Right 08/10/2013   Procedure: DEBRIDE DISTAL INTERPHALANGEAL JOINT/EXCISION MUCOID CYST RIGHT LONG FINGER and right small finger;  Surgeon: Cammie Sickle., MD;  Location: [facility];  Service: Orthopedics;  Laterality: Right;  . THYROIDECTOMY     Hurthele cell tumor  . UPPER GASTROINTESTINAL ENDOSCOPY      Current Outpatient Medications:  .  calcium-vitamin D 250-100 MG-UNIT tablet, Take 1 tablet by mouth 2 (two) times daily., Disp: , Rfl:  .  CALCIUM-VITAMIN D PO, Take by mouth 1 day or 1 dose., Disp: , Rfl:  .  cetirizine (ZYRTEC) 10 MG tablet, Take 10 mg by mouth daily., Disp: , Rfl:  .  clotrimazole-betamethasone (LOTRISONE) cream, Apply 1 application topically 2 (two) times daily., Disp: 30 g, Rfl: 0 .  diazepam (VALIUM) 5 MG tablet, TAKE 1 TABLET BY MOUTH EVERY 12 HOURS AS NEEDED FOR ANXIETY, Disp: 30 tablet, Rfl: 0 .  dicyclomine (BENTYL) 10 MG capsule,  Take 1 capsule (10 mg total) by mouth 4 (four) times daily -  before meals and at bedtime., Disp: 60 capsule, Rfl: 0 .  levothyroxine (EUTHYROX) 75 MCG tablet, TAKE 1 TABLET BY MOUTH EVERY OTHER DAY ALTERNATE  WITH  50MCG, Disp: 45 tablet, Rfl: 3 .  levothyroxine (SYNTHROID) 50 MCG tablet, TAKE 1 TABLET BY MOUTH EVERY OTHER DAY, Disp: 45 tablet, Rfl: 2 .  loratadine (CLARITIN) 10 MG tablet, Take 10 mg by mouth daily., Disp: , Rfl:  .  Multiple Minerals-Vitamins (CALCIUM & VIT D3 BONE HEALTH PO), Take by mouth. Reported on 09/09/2015, Disp: , Rfl:  .  OMEGA 3 1000 MG CAPS, Take by mouth daily.  , Disp: , Rfl:  .  omeprazole (PRILOSEC) 20 MG capsule, Take 1 capsule by mouth once daily, Disp: 90 capsule, Rfl: 3 .  Probiotic Product (PROBIOTIC-10 PO), Take by mouth 1 day or 1 dose., Disp: , Rfl:  .  Psyllium (METAMUCIL PO), Take by mouth every other day., Disp: , Rfl:  .  Sennosides (SENOKOT PO), Take by mouth., Disp: , Rfl:  .  simvastatin (ZOCOR) 20 MG tablet, Take 1 tablet (20 mg total) by mouth at bedtime., Disp: 90 tablet, Rfl: 3 .  triamcinolone cream (KENALOG) 0.1 %, Apply 1 application topically 2 (two) times daily., Disp:  100 g, Rfl: 3  Allergies  Allergen Reactions  . Ethambutol Hcl     REACTION: stomach pain  . Moxifloxacin     REACTION: mild rash - causal??   Review of Systems Objective:   Vitals:   07/25/19 1332  Temp: 97.7 F (36.5 C)    General: Well developed, nourished, in no acute distress, alert and oriented x3   Dermatological: Skin is warm, dry and supple bilateral. Nails x 10 are well maintained; remaining integument appears unremarkable at this time. There are no open sores, no preulcerative lesions, no rash or signs of infection present.  Vascular: Dorsalis Pedis artery and Posterior Tibial artery pedal pulses are 2/4 bilateral with immedate capillary fill time. Pedal hair growth present. No varicosities and no lower extremity edema present bilateral.    Neruologic: Grossly intact via light touch bilateral. Vibratory intact via tuning fork bilateral. Protective threshold with Semmes Wienstein monofilament intact to all pedal sites bilateral. Patellar and Achilles deep tendon reflexes 2+ bilateral. No Babinski or clonus noted bilateral.   Musculoskeletal: No gross boney pedal deformities bilateral. No pain, crepitus, or limitation noted with foot and ankle range of motion bilateral. Muscular strength 5/5 in all groups tested bilateral.  She has pain on palpation medial calcaneal tubercle of the left heel with some fluctuance on palpation of an easily palpable peroneus longus and peroneus brevis tendons.  Fluid collection is that the inferior portion of the malleolus.  Gait: Unassisted, Nonantalgic.    Radiographs:  Radiographs taken today do not demonstrate any type of osseous abnormalities other than moderate to severe osteopenia.  No acute findings are noted.  Assessment & Plan:   Assessment: Peroneal tendinitis and plantar fasciitis left.  Plan: I injected these areas today 10 mg of Kenalog both sides after sterile Betadine skin prep.  Follow-up with her in a month if not improved.     Thayne Cindric T. [place], [place]

## 2019-07-27 ENCOUNTER — Ambulatory Visit: Payer: Medicare Other | Admitting: Internal Medicine

## 2019-08-01 ENCOUNTER — Ambulatory Visit: Payer: Medicare Other | Admitting: Podiatry

## 2019-08-14 ENCOUNTER — Other Ambulatory Visit: Payer: Self-pay | Admitting: Internal Medicine

## 2019-08-14 NOTE — Telephone Encounter (Signed)
Diazepam 5 Mg Tablet  Checked controlled substance database. Last filled 03/17/2019  Last office visit 07/20/2019 Next Office visit n/s

## 2019-08-14 NOTE — Telephone Encounter (Signed)
Please send to someone in office

## 2019-08-31 ENCOUNTER — Other Ambulatory Visit: Payer: Self-pay

## 2019-08-31 ENCOUNTER — Ambulatory Visit (INDEPENDENT_AMBULATORY_CARE_PROVIDER_SITE_OTHER): Payer: Medicare Other | Admitting: Podiatry

## 2019-08-31 DIAGNOSIS — M7672 Peroneal tendinitis, left leg: Secondary | ICD-10-CM

## 2019-08-31 DIAGNOSIS — M722 Plantar fascial fibromatosis: Secondary | ICD-10-CM

## 2019-08-31 NOTE — Progress Notes (Signed)
She presents today for follow-up of her peroneal tendinitis and plantar fasciitis of her left foot.  States that has not improved as much as I wanted to.  Objective: Vital signs are stable she is alert and oriented x3 pulses are palpable.  She still has pain on palpation to the peroneus longus at the arcuate portion and at the peroneal tubercle.  She also has pain on palpation medial calcaneal tubercle left.  Assessment: Peroneal tendinitis and plantar fasciitis left.  Plan: Instructed her to start using Voltaren gel which she said she would purchase I injected dexamethasone 2 mg to the point of maximal tenderness of the peroneal tendons and 10 mg of Kenalog to the plantar fascial calcaneal insertion site.  Follow-up with her in about 6 weeks

## 2019-09-08 ENCOUNTER — Telehealth: Payer: Self-pay | Admitting: Podiatry

## 2019-09-08 NOTE — Telephone Encounter (Signed)
I'm calling about account number [id number] in regards to my wife's bill. I have a question about the bill we received from you. Thank you.

## 2019-09-19 NOTE — Progress Notes (Signed)
Subjective:    Patient ID: Marilyn Blake, female    DOB: [birth year], 79 y.o.   MRN: [id number]  HPI The patient is here for an acute visit.  Bee sting on left hip- over the weekend she was outside and got stung on her left hip region.  She washed it with soapy water, iced it and put on steroid cream.  It looks worse than it did.  It is more red and slightly larger .    Yesterday tongue was dry.  Using biotene. It has been dry for about 6 weeks.  She drinks plenty of fluids.  She denies sore throat or difficulty swallowing.  Yesterday she had a white coating.  She had antibiotics a few months ago for dental work but only took a few days.     Medications and allergies reviewed with patient and updated if appropriate.  Patient Active Problem List   Diagnosis Date Noted  . Left ear pain 07/21/2019  . Chest tightness 12/23/2017  . Myalgia 12/23/2017  . Tingling 11/05/2017  . Pelvic pressure in female 06/04/2017  . Acute otitis media 02/05/2017  . Palpitations 09/10/2015  . Seasonal rhinitis 02/03/2015  . Bronchitis, chronic obstructive, with exacerbation-MAIC 10/24/2013  . 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 IRRITABLE BOWEL SYNDROME 08/07/2009  . GERD 02/21/2009  . Abdominal pain 07/24/2008  . Bronchiectasis ([place]) 02/16/2008  . Hypothyroidism 07/11/2007  . Hyperlipidemia 07/11/2007  . Osteopenia 07/11/2007    Current Outpatient Medications on File Prior to Visit  Medication Sig Dispense Refill  . calcium-vitamin D 250-100 MG-UNIT tablet Take 1 tablet by mouth 2 (two) times daily.    Marland Kitchen CALCIUM-VITAMIN D PO Take by mouth 1 day or 1 dose.    . cetirizine (ZYRTEC) 10 MG tablet Take 10 mg by mouth daily.    . clotrimazole-betamethasone (LOTRISONE) cream Apply 1 application topically 2 (two) times daily. 30 g 0  . diazepam (VALIUM) 5 MG tablet TAKE 1 TABLET BY MOUTH EVERY 12 HOURS AS NEEDED FOR ANXIETY 30 tablet 0  . levothyroxine (EUTHYROX) 75 MCG tablet TAKE 1 TABLET BY  MOUTH EVERY OTHER DAY ALTERNATE  WITH  50MCG 45 tablet 3  . levothyroxine (SYNTHROID) 50 MCG tablet TAKE 1 TABLET BY MOUTH EVERY OTHER DAY 45 tablet 2  . loratadine (CLARITIN) 10 MG tablet Take 10 mg by mouth daily.    . Multiple Minerals-Vitamins (CALCIUM & VIT D3 BONE HEALTH PO) Take by mouth. Reported on 09/09/2015    . OMEGA 3 1000 MG CAPS Take by mouth daily.      Marland Kitchen omeprazole (PRILOSEC) 20 MG capsule Take 1 capsule by mouth once daily 90 capsule 3  . Probiotic Product (PROBIOTIC-10 PO) Take by mouth 1 day or 1 dose.    . Psyllium (METAMUCIL PO) Take by mouth every other day.    . Sennosides (SENOKOT PO) Take by mouth.    . simvastatin (ZOCOR) 20 MG tablet Take 1 tablet (20 mg total) by mouth at bedtime. 90 tablet 3  . triamcinolone cream (KENALOG) 0.1 % Apply 1 application topically 2 (two) times daily. 100 g 3   No current facility-administered medications on file prior to visit.    Past Medical History:  Diagnosis Date  . ALLERGIC RHINITIS   . ANXIETY   . Arthritis   . BACTEREMIA, MYCOBACTERIUM AVIUM COMPLEX   . BRONCHIECTASIS   . COLONIC POLYPS, HX OF   . Fundic gland polyps of stomach, benign   . GERD   .  GOUT   . Headache   . HYPERLIPIDEMIA   . HYPOTHYROIDISM   . Irritable bowel syndrome   . OSTEOPENIA     Past Surgical History:  Procedure Laterality Date  . ABDOMINAL HYSTERECTOMY    . BLADDER SUSPENSION    . CARPAL TUNNEL RELEASE Left   . COLONOSCOPY    . MASS EXCISION Right 08/10/2013   Procedure: DEBRIDE DISTAL INTERPHALANGEAL JOINT/EXCISION MUCOID CYST RIGHT LONG FINGER and right small finger;  Surgeon: Cammie Sickle., MD;  Location: [facility];  Service: Orthopedics;  Laterality: Right;  . THYROIDECTOMY     Hurthele cell tumor  . UPPER GASTROINTESTINAL ENDOSCOPY      Social History   Socioeconomic History  . Marital status: Married    Spouse name: Not on file  . Number of children: 3  . Years of education: Not on file  . Highest  education level: Not on file  Occupational History  . Occupation: Retired    Fish farm manager: RETIRED  Tobacco Use  . Smoking status: Never Smoker  . Smokeless tobacco: Never Used  . Tobacco comment: Married, Housewife  Substance and Sexual Activity  . Alcohol use: No    Alcohol/week: 0.0 standard drinks  . Drug use: No  . Sexual activity: Never  Other Topics Concern  . Not on file  Social History Narrative  . Not on file   Social Determinants of Health   Financial Resource Strain:   . Difficulty of Paying Living Expenses:   Food Insecurity:   . Worried About Charity fundraiser in the Last Year:   . Arboriculturist in the Last Year:   Transportation Needs:   . Film/video editor (Medical):   Marland Kitchen Lack of Transportation (Non-Medical):   Physical Activity:   . Days of Exercise per Week:   . Minutes of Exercise per Session:   Stress:   . Feeling of Stress :   Social Connections:   . Frequency of Communication with Friends and Family:   . Frequency of Social Gatherings with Friends and Family:   . Attends Religious Services:   . Active Member of Clubs or Organizations:   . Attends Archivist Meetings:   Marland Kitchen Marital Status:     Family History  Problem Relation Age of Onset  . Cervical cancer Mother 4        died age 16  . Heart attack Father 37  . Colon cancer Neg Hx   . Esophageal cancer Neg Hx   . Stomach cancer Neg Hx   . Rectal cancer Neg Hx     Review of Systems  Constitutional: Negative for chills and fever.  HENT: Negative for sore throat and trouble swallowing.   Skin: Positive for color change and wound.       Objective:   Vitals:   09/20/19 1331  BP: (!) 148/80  Pulse: 74  Resp: 16  Temp: 98.4 F (36.9 C)  SpO2: 95%   BP Readings from Last 3 Encounters:  09/20/19 (!) 148/80  07/20/19 112/68  01/25/19 116/60   Wt Readings from Last 3 Encounters:  09/20/19 110 lb (49.9 kg)  07/20/19 111 lb 4 oz (50.5 kg)  01/25/19 111 lb 6.4 oz  (50.5 kg)   Body mass index is 19.49 kg/m.   Physical Exam Constitutional:      General: She is not in acute distress.    Appearance: Normal appearance. She is not ill-appearing.  HENT:  Head: Normocephalic and atraumatic.     Mouth/Throat:     Mouth: Mucous membranes are dry.     Pharynx: No oropharyngeal exudate or posterior oropharyngeal erythema.     Comments: Tongue w/o minimal white appearance, no swelling Skin:    General: Skin is warm and dry.     Comments: Insect bite hip left lateral hip with surround bright erythema that is patchy - no swelling, no tenderness w/ palpation and no warmth  Neurological:     Mental Status: She is alert.            Assessment & Plan:    See Problem List for Assessment and Plan of chronic medical problems.    This visit occurred during the SARS-CoV-2 public health emergency.  Safety protocols were in place, including screening questions prior to the visit, additional usage of staff PPE, and extensive cleaning of exam room while observing appropriate contact time as indicated for disinfecting solutions.

## 2019-09-20 ENCOUNTER — Encounter: Payer: Self-pay | Admitting: Internal Medicine

## 2019-09-20 ENCOUNTER — Ambulatory Visit (INDEPENDENT_AMBULATORY_CARE_PROVIDER_SITE_OTHER): Payer: Medicare Other | Admitting: Internal Medicine

## 2019-09-20 ENCOUNTER — Other Ambulatory Visit: Payer: Self-pay

## 2019-09-20 VITALS — BP 148/80 | HR 74 | Temp 98.4°F | Resp 16 | Ht 63.0 in | Wt 110.0 lb

## 2019-09-20 DIAGNOSIS — S70262A Insect bite (nonvenomous), left hip, initial encounter: Secondary | ICD-10-CM | POA: Diagnosis not present

## 2019-09-20 DIAGNOSIS — W57XXXA Bitten or stung by nonvenomous insect and other nonvenomous arthropods, initial encounter: Secondary | ICD-10-CM | POA: Diagnosis not present

## 2019-09-20 DIAGNOSIS — B370 Candidal stomatitis: Secondary | ICD-10-CM | POA: Insufficient documentation

## 2019-09-20 DIAGNOSIS — Z23 Encounter for immunization: Secondary | ICD-10-CM | POA: Diagnosis not present

## 2019-09-20 MED ORDER — NYSTATIN 100000 UNIT/ML MT SUSP: 5.0000 mL | Freq: Four times a day (QID) | OROMUCOSAL | 0 refills | Status: DC

## 2019-09-20 NOTE — Assessment & Plan Note (Addendum)
Acute Looks like a local reaction to the insect sting, unlikely cellulitis No antibiotics at this time tdap today Continue steroid cream BID - she has not been using it consistently and will start  Monitor closely  Call if no improvement

## 2019-09-20 NOTE — Patient Instructions (Addendum)
  Tetanus immunization administered today.     Medications reviewed and updated.  Changes include :     Start nystatin mouthwash and use for 10 days.  Use the steroid cream twice daily on the bee sting.    Your prescription(s) have been submitted to your pharmacy. Please take as directed and contact our office if you believe you are having problem(s) with the medication(s)   Please call if there is no improvement in your symptoms.

## 2019-09-20 NOTE — Assessment & Plan Note (Signed)
Acute Symptoms and exam suggestive of thrush Nystatin S and S Return if no improvement

## 2019-10-08 ENCOUNTER — Other Ambulatory Visit: Payer: Self-pay | Admitting: Internal Medicine

## 2019-10-09 DIAGNOSIS — H938X3 Other specified disorders of ear, bilateral: Secondary | ICD-10-CM | POA: Diagnosis not present

## 2019-10-12 ENCOUNTER — Ambulatory Visit: Payer: MEDICARE | Admitting: Podiatry

## 2019-11-07 ENCOUNTER — Telehealth: Payer: Self-pay | Admitting: Internal Medicine

## 2019-11-07 NOTE — Telephone Encounter (Signed)
She doesn't appear to have had covid vaccine, so she needs to be tested for covid.  Variants of this virus are spreading. For symptoms she can take an otc like Tylenol cold and Flu or something similar, Delsym cough syrup, throat lozenges, zinc as in Zicam, stay well-hydrated and avoid exposing other people.

## 2019-11-07 NOTE — Telephone Encounter (Signed)
Spoke with pt. She has had her COVID vaccines, these have been documented. She is aware of Dr. Janee Morn recommendations. Nothing further was needed.

## 2019-11-07 NOTE — Telephone Encounter (Signed)
Spoke with pt. States that she is not feeling well. Reports increased coughing, chills and body aches. Cough is non productive. Denies congestion/runny nose, sore throat, severe headache, joint pain, unexplained muscle aches, increased shortness of breath, loss of taste or smell, rash, N/V/D, abdominal pain, redness around/in the eye, increased weakness or unexplained bruising or bleeding.   Symptoms started about 1 week ago. Pt would like Dr. Janee Morn recommendations on what she can take OTC, she does not want a prescription.  Dr. Annamaria Boots - please advise. Thanks.

## 2019-11-09 ENCOUNTER — Ambulatory Visit: Payer: Medicare Other | Admitting: Podiatry

## 2019-11-10 ENCOUNTER — Telehealth: Payer: Self-pay

## 2019-11-10 NOTE — Telephone Encounter (Signed)
New message  The patient noticed her vaccine is not mentioned in her medical records done at [facility]   2.23.21  First [place]  3.16.21  Second [place]

## 2019-11-10 NOTE — Telephone Encounter (Signed)
This information is already reflected in her chart.

## 2019-11-13 ENCOUNTER — Other Ambulatory Visit: Payer: Self-pay

## 2019-11-13 ENCOUNTER — Encounter: Payer: Self-pay | Admitting: Internal Medicine

## 2019-11-13 ENCOUNTER — Ambulatory Visit (INDEPENDENT_AMBULATORY_CARE_PROVIDER_SITE_OTHER): Payer: Medicare Other | Admitting: Internal Medicine

## 2019-11-13 DIAGNOSIS — J301 Allergic rhinitis due to pollen: Secondary | ICD-10-CM

## 2019-11-13 DIAGNOSIS — J441 Chronic obstructive pulmonary disease with (acute) exacerbation: Secondary | ICD-10-CM

## 2019-11-13 MED ORDER — DOXYCYCLINE HYCLATE 100 MG PO TABS: ORAL_TABLET | ORAL | 0 refills | Status: DC

## 2019-11-13 NOTE — Patient Instructions (Signed)
Script sent for doxycycline antibiotic  Ok to stay hydrated and to take otc meds for symptoms as needed  Please call if you don't do well.

## 2019-11-13 NOTE — Progress Notes (Signed)
Patient ID: Marilyn Blake, female    DOB: Apr 26, 1941, 79 y.o.   MRN: [id number]  HPI 02/03/2011-79 year old female never smoker followed for bronchiectasis, history of MAIC bronchitis, allergic rhinitis.   --------------------------------------------------------------------------------------  01/25/2019-  79 year old female never smoker followed for bronchiectasis, recurrent MAIC bronchitis, allergic rhinitis, insomnia, complicated by hypothyroid, IBS pt reports recent cold "spell", recently tested neg for covid; reports mild congestion, runny nose, postnasal drip, and headache -----pt reports recent cold "spell", recently tested neg for covid; reports mild congestion, runny nose, postnasal drip, and headache 1 week chills, HA, malaise-  Felt like a viral illness. Mild dry cough. Chronic SOB, mainly exertion. CXR w rib details ?'d ILD changes in January- discussed.  Seen at [facility] and doctor there concerned about appearance of lungs on abd CT.  CXR 09/16/17 No acute abnormality. Stable changes of COPD and areas of parenchymal scarring and cylindrical bronchiectasis. CXR/ L ribs 06/09/18- 1. Chronic interstitial lung disease and bilateral pleuroparenchymal thickening. Superimposed active interstitial process including pneumonitis could not be excluded. 2. No acute bony abnormality. No evidence of displaced rib fracture or pneumothorax.  11/13/19- 79 year old female never smoker followed for bronchiectasis, recurrent MAIC bronchitis, allergic rhinitis, insomnia, complicated by hypothyroid, IBS ------cough, chills and runny nose Called [place] last week wanting otc suggestions for same symptoms she c/o today. Not well for at least a week. Some chill, T100. No urine or GI symptoms.  . Dtr had similar but shook it off. Yesterday sputum was yellow-green.  Had 2 Phizer Covax. CXR 01/25/2019- IMPRESSION: 1. Chronic changes of bronchiectasis and post infectious or inflammatory scarring  related to chronic MAI infection redemonstrated, as above. No acute findings.  Review of Systems- see HPI Constitutional:   No-   weight loss, +night sweats, , chills, +fatigue, lassitude. HEENT:   +  headaches, difficulty swallowing, tooth/dental problems, sore throat,       No-  sneezing, itching, ear ache, +nasal congestion, post nasal drip,  CV:  atypicalchest pain, orthopnea, PND, swelling in lower extremities, anasarca,dizziness, palpitations Resp: + shortness of breath with exertion or at rest.             + productive cough,   non-productive cough,  no- coughing up of blood.              No- change in color of mucus.  No- wheezing.   Skin: No-   rash or lesions. GI:  No-   heartburn, indigestion, abdominal pain, nausea, vomiting,  GU:  MS:  No-   joint pain or swelling. . Neuro- grossly normal to observation,   Psych:  No- change in mood or affect. No depression or anxiety.  No memory loss.  Objective:   Physical Exam General- Alert, Oriented, Affect-appropriate, Distress- none acute, petite/ thin lady Skin- rash-none, lesions- none, excoriation- none Lymphadenopathy- none Head- atraumatic            Eyes- Gross vision intact, PERRLA, conjunctivae clear secretions            Ears- Hearing, canals- normal            Nose- Clear, No-Septal dev, mucus, polyps, erosion, perforation             Throat- Mallampati II , mucosa + red tonsil crypts/ no exudate , derainage- none, tonsils- atrophic, Neck- flexible , trachea midline, no stridor , thyroid nl, carotid no bruit Chest - symmetrical excursion , unlabored           Heart/CV-  RRR , no murmur , no gallop  , no rub, nl s1 s2                           - JVD- none , edema- none, stasis changes- none, varices- none           Lung-  Unlabored,+ few crackles,  wheeze-none, cough- none , dullness-none, rub-none           Chest wall-  Abd- Br/ Gen/ Rectal- Not done, not indicated Extrem- cyanosis- none, clubbing, none, atrophy- none,  strength- nl Neuro- grossly intact to observation

## 2019-11-18 NOTE — Assessment & Plan Note (Signed)
Too late in season for usual spring pollens. Discussed as unlikely.

## 2019-11-18 NOTE — Assessment & Plan Note (Signed)
Mild exacerbation- likely viral syndrome URI. Now with sputum changing, discussed antibiotic.  Plan- fluids, doxycycline

## 2019-11-21 ENCOUNTER — Telehealth: Payer: Self-pay | Admitting: Internal Medicine

## 2019-11-21 NOTE — Telephone Encounter (Signed)
New message:   Pt is requesting this appt be on the phone. I see if the appt notes it can only be in person. Please advise.

## 2019-11-24 ENCOUNTER — Other Ambulatory Visit: Payer: Self-pay

## 2019-11-24 ENCOUNTER — Ambulatory Visit (INDEPENDENT_AMBULATORY_CARE_PROVIDER_SITE_OTHER): Payer: Medicare Other

## 2019-11-24 VITALS — Ht 62.0 in | Wt 109.6 lb

## 2019-11-24 DIAGNOSIS — Z0000 Encounter for general adult medical examination without abnormal findings: Secondary | ICD-10-CM

## 2019-11-24 NOTE — Patient Instructions (Signed)
Marilyn Blake , Thank you for taking time to come for your Medicare Wellness Visit. I appreciate your ongoing commitment to your health goals. Please review the following plan we discussed and let me know if I can assist you in the future.   Screening recommendations/referrals: Colonoscopy: 12/11/2017; no repeat due to age Mammogram: 07/26/2019; due every 1-2 years Bone Density: 07/02/2016; due every 3 years Recommended yearly ophthalmology/optometry visit for glaucoma screening and checkup Recommended yearly dental visit for hygiene and checkup  Vaccinations: Influenza vaccine: 01/25/2019 Pneumococcal vaccine: completed Tdap vaccine: 09/20/2019 Shingles vaccine: never done    Covid-19: completed  Advanced directives: Please bring a copy of your health care power of attorney and living will to the office at your convenience.  Conditions/risks identified: Please continue to do your personal lifestyle choices by: daily care of teeth and gums, regular physical activity (goal should be 5 days a week for 30 minutes), eat a healthy diet, avoid tobacco and drug use, limiting any alcohol intake, taking a low-dose aspirin (if not allergic or have been advised by your provider otherwise) and taking vitamins and minerals as recommended by your provider. Continue doing brain stimulating activities (puzzles, reading, adult coloring books, staying active) to keep memory sharp. Continue to eat heart healthy diet (full of fruits, vegetables, whole grains, lean protein, water--limit salt, fat, and sugar intake) and increase physical activity as tolerated.  Next appointment: Please schedule your next Medicare Wellness Visit with your Nurse Health Advisor in 1 year.  Preventive Care 80 Years and Older, Female Preventive care refers to lifestyle choices and visits with your health care provider that can promote health and wellness. What does preventive care include?  A yearly physical exam. This is also called an  annual well check.  Dental exams once or twice a year.  Routine eye exams. Ask your health care provider how often you should have your eyes checked.  Personal lifestyle choices, including:  Daily care of your teeth and gums.  Regular physical activity.  Eating a healthy diet.  Avoiding tobacco and drug use.  Limiting alcohol use.  Practicing safe sex.  Taking low-dose aspirin every day.  Taking vitamin and mineral supplements as recommended by your health care provider. What happens during an annual well check? The services and screenings done by your health care provider during your annual well check will depend on your age, overall health, lifestyle risk factors, and family history of disease. Counseling  Your health care provider may ask you questions about your:  Alcohol use.  Tobacco use.  Drug use.  Emotional well-being.  Home and relationship well-being.  Sexual activity.  Eating habits.  History of falls.  Memory and ability to understand (cognition).  Work and work Statistician.  Reproductive health. Screening  You may have the following tests or measurements:  Height, weight, and BMI.  Blood pressure.  Lipid and cholesterol levels. These may be checked every 5 years, or more frequently if you are over 34 years old.  Skin check.  Lung cancer screening. You may have this screening every year starting at age 41 if you have a 30-pack-year history of smoking and currently smoke or have quit within the past 15 years.  Fecal occult blood test (FOBT) of the stool. You may have this test every year starting at age 37.  Flexible sigmoidoscopy or colonoscopy. You may have a sigmoidoscopy every 5 years or a colonoscopy every 10 years starting at age 67.  Hepatitis C blood test.  Hepatitis  B blood test.  Sexually transmitted disease (STD) testing.  Diabetes screening. This is done by checking your blood sugar (glucose) after you have not eaten for  a while (fasting). You may have this done every 1-3 years.  Bone density scan. This is done to screen for osteoporosis. You may have this done starting at age 43.  Mammogram. This may be done every 1-2 years. Talk to your health care provider about how often you should have regular mammograms. Talk with your health care provider about your test results, treatment options, and if necessary, the need for more tests. Vaccines  Your health care provider may recommend certain vaccines, such as:  Influenza vaccine. This is recommended every year.  Tetanus, diphtheria, and acellular pertussis (Tdap, Td) vaccine. You may need a Td booster every 10 years.  Zoster vaccine. You may need this after age 60.  Pneumococcal 13-valent conjugate (PCV13) vaccine. One dose is recommended after age 30.  Pneumococcal polysaccharide (PPSV23) vaccine. One dose is recommended after age 88. Talk to your health care provider about which screenings and vaccines you need and how often you need them. This information is not intended to replace advice given to you by your health care provider. Make sure you discuss any questions you have with your health care provider. Document Released: 05/31/2015 Document Revised: 01/22/2016 Document Reviewed: 03/05/2015 Elsevier Interactive Patient Education  2017 [place] [employer] Prevention in the Home Falls can cause injuries. They can happen to people of all ages. There are many things you can do to make your home safe and to help prevent falls. What can I do on the outside of my home?  Regularly fix the edges of walkways and driveways and fix any cracks.  Remove anything that might make you trip as you walk through a door, such as a raised step or threshold.  Trim any bushes or trees on the path to your home.  Use bright outdoor lighting.  Clear any walking paths of anything that might make someone trip, such as rocks or tools.  Regularly check to see if handrails  are loose or broken. Make sure that both sides of any steps have handrails.  Any raised decks and porches should have guardrails on the edges.  Have any leaves, snow, or ice cleared regularly.  Use sand or salt on walking paths during winter.  Clean up any spills in your garage right away. This includes oil or grease spills. What can I do in the bathroom?  Use night lights.  Install grab bars by the toilet and in the tub and shower. Do not use towel bars as grab bars.  Use non-skid mats or decals in the tub or shower.  If you need to sit down in the shower, use a plastic, non-slip stool.  Keep the floor dry. Clean up any water that spills on the floor as soon as it happens.  Remove soap buildup in the tub or shower regularly.  Attach bath mats securely with double-sided non-slip rug tape.  Do not have throw rugs and other things on the floor that can make you trip. What can I do in the bedroom?  Use night lights.  Make sure that you have a light by your bed that is easy to reach.  Do not use any sheets or blankets that are too big for your bed. They should not hang down onto the floor.  Have a firm chair that has side arms. You can use this for  support while you get dressed.  Do not have throw rugs and other things on the floor that can make you trip. What can I do in the kitchen?  Clean up any spills right away.  Avoid walking on wet floors.  Keep items that you use a lot in easy-to-reach places.  If you need to reach something above you, use a strong step stool that has a grab bar.  Keep electrical cords out of the way.  Do not use floor polish or wax that makes floors slippery. If you must use wax, use non-skid floor wax.  Do not have throw rugs and other things on the floor that can make you trip. What can I do with my stairs?  Do not leave any items on the stairs.  Make sure that there are handrails on both sides of the stairs and use them. Fix handrails  that are broken or loose. Make sure that handrails are as long as the stairways.  Check any carpeting to make sure that it is firmly attached to the stairs. Fix any carpet that is loose or worn.  Avoid having throw rugs at the top or bottom of the stairs. If you do have throw rugs, attach them to the floor with carpet tape.  Make sure that you have a light switch at the top of the stairs and the bottom of the stairs. If you do not have them, ask someone to add them for you. What else can I do to help prevent falls?  Wear shoes that:  Do not have high heels.  Have rubber bottoms.  Are comfortable and fit you well.  Are closed at the toe. Do not wear sandals.  If you use a stepladder:  Make sure that it is fully opened. Do not climb a closed stepladder.  Make sure that both sides of the stepladder are locked into place.  Ask someone to hold it for you, if possible.  Clearly mark and make sure that you can see:  Any grab bars or handrails.  First and last steps.  Where the edge of each step is.  Use tools that help you move around (mobility aids) if they are needed. These include:  Canes.  Walkers.  Scooters.  Crutches.  Turn on the lights when you go into a dark area. Replace any light bulbs as soon as they burn out.  Set up your furniture so you have a clear path. Avoid moving your furniture around.  If any of your floors are uneven, fix them.  If there are any pets around you, be aware of where they are.  Review your medicines with your doctor. Some medicines can make you feel dizzy. This can increase your chance of falling. Ask your doctor what other things that you can do to help prevent falls. This information is not intended to replace advice given to you by your health care provider. Make sure you discuss any questions you have with your health care provider. Document Released: 02/28/2009 Document Revised: 10/10/2015 Document Reviewed: 06/08/2014 Elsevier  Interactive Patient Education  2017 [employer].

## 2019-11-24 NOTE — Progress Notes (Signed)
Subjective:   Marilyn Blake is a 79 y.o. female who presents for Medicare Annual (Subsequent) preventive examination.  Review of Systems    No ROS. Medicare Wellness Visit Cardiac Risk Factors include: advanced age (>63mn, >>47women);dyslipidemia;family history of premature cardiovascular disease     Objective:    Today's Vitals   11/24/19 1153  Weight: 109 lb 9.6 oz (49.7 kg)  Height: _0  (1.575 m)   Body mass index is 20.05 kg/m.  Advanced Directives 11/24/2019 07/25/2018 06/17/2017 11/12/2016 06/16/2016 12/07/2014 07/08/2012  Does Patient Have a Medical Advance Directive? Yes Yes No No No No Patient does not have advance directive  Type of AParamedicof AGonzalesLiving will - - - - -  Does patient want to make changes to medical advance directive? No - Patient declined - - - - - -  Copy of HLairdin Chart? No - copy requested No - copy requested - - - - -  Would patient like information on creating a medical advance directive? - - Yes (ED - Information included in AVS) No - Patient declined Yes (MAU/Ambulatory/Procedural Areas - Information given) - -    Current Medications (verified) Outpatient Encounter Medications as of 11/24/2019  Medication Sig  . calcium-vitamin D 250-100 MG-UNIT tablet Take 1 tablet by mouth 2 (two) times daily.  .Marland KitchenCALCIUM-VITAMIN D PO Take by mouth 1 day or 1 dose.  . cetirizine (ZYRTEC) 10 MG tablet Take 10 mg by mouth daily.  . clotrimazole-betamethasone (LOTRISONE) cream Apply 1 application topically 2 (two) times daily.  . diazepam (VALIUM) 5 MG tablet TAKE 1 TABLET BY MOUTH EVERY 12 HOURS AS NEEDED FOR ANXIETY  . doxycycline (VIBRA-TABS) 100 MG tablet 2 today then one daily  . levothyroxine (EUTHYROX) 75 MCG tablet TAKE 1 TABLET BY MOUTH EVERY OTHER DAY ALTERNATE  WITH  50MCG  . levothyroxine (SYNTHROID) 50 MCG tablet TAKE 1 TABLET BY MOUTH EVERY OTHER DAY  . loratadine  (CLARITIN) 10 MG tablet Take 10 mg by mouth daily.  . Multiple Minerals-Vitamins (CALCIUM & VIT D3 BONE HEALTH PO) Take by mouth. Reported on 09/09/2015  . nystatin (MYCOSTATIN) 100000 UNIT/ML suspension Take 5 mLs (500,000 Units total) by mouth 4 (four) times daily. Use for 10 days  . OMEGA 3 1000 MG CAPS Take by mouth daily.    .Marland Kitchenomeprazole (PRILOSEC) 20 MG capsule Take 1 capsule by mouth once daily  . Probiotic Product (PROBIOTIC-10 PO) Take by mouth 1 day or 1 dose.  . Psyllium (METAMUCIL PO) Take by mouth every other day.  . simvastatin (ZOCOR) 20 MG tablet Take 1 tablet (20 mg total) by mouth at bedtime.  . triamcinolone cream (KENALOG) 0.1 % Apply 1 application topically 2 (two) times daily.  . Sennosides (SENOKOT PO) Take by mouth. (Patient not taking: Reported on 11/24/2019)   No facility-administered encounter medications on file as of 11/24/2019.    Allergies (verified) Ethambutol hcl and Moxifloxacin   History: Past Medical History:  Diagnosis Date  . ALLERGIC RHINITIS   . ANXIETY   . Arthritis   . BACTEREMIA, MYCOBACTERIUM AVIUM COMPLEX   . BRONCHIECTASIS   . COLONIC POLYPS, HX OF   . Fundic gland polyps of stomach, benign   . GERD   . GOUT   . Headache   . HYPERLIPIDEMIA   . HYPOTHYROIDISM   . Irritable bowel syndrome   . OSTEOPENIA    Past Surgical History:  Procedure  Laterality Date  . ABDOMINAL HYSTERECTOMY    . BLADDER SUSPENSION    . CARPAL TUNNEL RELEASE Left   . COLONOSCOPY    . MASS EXCISION Right 08/10/2013   Procedure: DEBRIDE DISTAL INTERPHALANGEAL JOINT/EXCISION MUCOID CYST RIGHT LONG FINGER and right small finger;  Surgeon: Cammie Sickle., MD;  Location: [facility];  Service: Orthopedics;  Laterality: Right;  . THYROIDECTOMY     Hurthele cell tumor  . UPPER GASTROINTESTINAL ENDOSCOPY     Family History  Problem Relation Age of Onset  . Cervical cancer Mother 60        died age 16  . Heart attack Father 58  . Colon cancer Neg  Hx   . Esophageal cancer Neg Hx   . Stomach cancer Neg Hx   . Rectal cancer Neg Hx    Social History   Socioeconomic History  . Marital status: Married    Spouse name: Not on file  . Number of children: 3  . Years of education: Not on file  . Highest education level: Not on file  Occupational History  . Occupation: Retired    Fish farm manager: RETIRED  Tobacco Use  . Smoking status: Never Smoker  . Smokeless tobacco: Never Used  . Tobacco comment: Married, Statistician  . Vaping Use: Never used  Substance and Sexual Activity  . Alcohol use: No    Alcohol/week: 0.0 standard drinks  . Drug use: No  . Sexual activity: Never  Other Topics Concern  . Not on file  Social History Narrative  . Not on file   Social Determinants of Health   Financial Resource Strain: Low Risk   . Difficulty of Paying Living Expenses: Not hard at all  Food Insecurity: No Food Insecurity  . Worried About Charity fundraiser in the Last Year: Never true  . Ran Out of Food in the Last Year: Never true  Transportation Needs: No Transportation Needs  . Lack of Transportation (Medical): No  . Lack of Transportation (Non-Medical): No  Physical Activity: Sufficiently Active  . Days of Exercise per Week: 5 days  . Minutes of Exercise per Session: 30 min  Stress: No Stress Concern Present  . Feeling of Stress : Not at all  Social Connections: Socially Integrated  . Frequency of Communication with Friends and Family: More than three times a week  . Frequency of Social Gatherings with Friends and Family: More than three times a week  . Attends Religious Services: More than 4 times per year  . Active Member of Clubs or Organizations: Yes  . Attends Archivist Meetings: More than 4 times per year  . Marital Status: Married    Tobacco Counseling Counseling given: No Comment: Married, Housewife   Clinical Intake:  Pre-visit preparation completed: Yes  Pain : No/denies pain      Nutritional Status: BMI of 19-24  Normal Nutritional Risks: None Diabetes: No  How often do you need to have someone help you when you read instructions, pamphlets, or other written materials from your doctor or pharmacy?: 1 - Never What is the last grade level you completed in school?: HSG  Diabetic? no  Interpreter Needed?: No  Information entered by :: Lina Hitch N. Maan Zarcone, LPN   Activities of Daily Living In your present state of health, do you have any difficulty performing the following activities: 11/24/2019  Hearing? N  Vision? N  Difficulty concentrating or making decisions? N  Walking or climbing  stairs? N  Dressing or bathing? N  Doing errands, shopping? N  Preparing Food and eating ? N  Using the Toilet? N  In the past six months, have you accidently leaked urine? Y  Comment wears a mini pad  Do you have problems with loss of bowel control? N  Managing your Medications? N  Managing your Finances? N  Housekeeping or managing your Housekeeping? N  Some recent data might be hidden    Patient Care Team: Hoyt Koch, MD as PCP - General (Internal Medicine) Deneise Lever, MD (Pulmonary Disease) Gatha Mayer, MD (Gastroenterology) Lenard Simmer (Dentistry)  Indicate any recent Medical Services you may have received from other than [facility] providers in the past year (date may be approximate).     Assessment:   This is a routine wellness examination for Marilyn Blake.  Hearing/Vision screen No exam data present  Dietary issues and exercise activities discussed: Current Exercise Habits: Home exercise routine, Type of exercise: walking;treadmill;Other - see comments (stationary bike), Time (Minutes): 30, Frequency (Times/Week): 5, Weekly Exercise (Minutes/Week): 150, Intensity: Moderate, Exercise limited by: respiratory conditions(s)  Goals    .  maintain current health (pt-stated)    .  Patient Stated      I would like to travel to [place] more  often to visit my family. Continue to exercise, eat healthy, stay as active and as independent as possible.    .  Patient Stated      I would like to go travel [place] visit my family and relax.     .  Patient Stated (pt-stated)      To maintain my current health status by continuing to eat healthy and stay physically active & socially active.       Depression Screen PHQ 2/9 Scores 11/24/2019 07/25/2018 06/17/2017 06/16/2016 02/28/2016 02/27/2015 02/12/2015  PHQ - 2 Score 0 0 1 0 0 0 1  PHQ- 9 Score - - 2 - - - -    Fall Risk Fall Risk  11/24/2019 09/20/2019 07/25/2018 06/17/2017 06/16/2016  Falls in the past year? 1 0 0 No No  Number falls in past yr: 0 0 0 - -  Injury with Fall? 0 0 - - -  Comment - - - - -  Risk Factor Category  - - - - -  Risk for fall due to : History of fall(s) - - - -  Risk for fall due to: Comment - - - - -  Follow up Falls evaluation completed;Education provided Falls evaluation completed Falls prevention discussed - -    Any stairs in or around the home? Yes  If so, are there any without handrails? No  Home free of loose throw rugs in walkways, pet beds, electrical cords, etc? Yes  Adequate lighting in your home to reduce risk of falls? Yes   ASSISTIVE DEVICES UTILIZED TO PREVENT FALLS:  Life alert? No  Use of a cane, walker or w/c? No  Grab bars in the bathroom? No  Shower chair or bench in shower? No  Elevated toilet seat or a handicapped toilet? No   TIMED UP AND GO:  Was the test performed? No .  Length of time to ambulate 10 feet: 0 sec.   Gait steady and fast without use of assistive device  Cognitive Function: MMSE - Mini Mental State Exam 06/17/2017  Orientation to time 5  Orientation to Place 5  Registration 3  Attention/ Calculation 5  Recall 2  Language-  name 2 objects 2  Language- repeat 1  Language- follow 3 step command 3  Language- read & follow direction 1  Write a sentence 1  Copy design 1  Total score 29         Immunizations Immunization History  Administered Date(s) Administered  . Fluad Quad(high Dose 65+) 01/25/2019  . Influenza Split 02/03/2011, 02/15/2012  . Influenza Whole 02/16/2008, 03/25/2009, 02/03/2010  . Influenza, High Dose Seasonal PF 02/11/2016, 03/12/2018  . Influenza,inj,Quad PF,6+ Mos 01/18/2013, 03/15/2014, 01/31/2015  . Influenza-Unspecified 01/31/2015, 04/01/2017, 03/12/2018  . PFIZER SARS-COV-2 Vaccination 07/11/2019, 08/01/2019  . Pneumococcal Conjugate-13 03/27/2014  . Pneumococcal Polysaccharide-23 08/14/2008  . Td 03/27/2009  . Tdap 09/20/2019    TDAP status: Up to date Flu Vaccine status: Up to date Pneumococcal vaccine status: Up to date Covid-19 vaccine status: Completed vaccines  Qualifies for Shingles Vaccine? Yes   Zostavax completed No   Shingrix Completed?: No.    Education has been provided regarding the importance of this vaccine. Patient has been advised to call insurance company to determine out of pocket expense if they have not yet received this vaccine. Advised may also receive vaccine at local pharmacy or Health Dept. Verbalized acceptance and understanding.  Screening Tests Health Maintenance  Topic Date Due  . INFLUENZA VACCINE  12/17/2019  . TETANUS/TDAP  09/19/2029  . DEXA SCAN  Completed  . COVID-19 Vaccine  Completed  . PNA vac Low Risk Adult  Completed    Health Maintenance  There are no preventive care reminders to display for this patient.  Colorectal cancer screening: No longer required.  Mammogram status: Completed 07/26/2019. Repeat every year Bone Density status: Completed 07/02/2016. Results reflect: Bone density results: OSTEOPENIA. Repeat every 3 years.  Lung Cancer Screening: (Low Dose CT Chest recommended if Age 29-80 years, 30 pack-year currently smoking OR have quit w/in 15years.) does not qualify.   Lung Cancer Screening Referral: no  Additional Screening:  Hepatitis C Screening: does not qualify; Completed  no  Vision Screening: Recommended annual ophthalmology exams for early detection of glaucoma and other disorders of the eye. Is the patient up to date with their annual eye exam?  Yes  Who is the provider or what is the name of the office in which the patient attends annual eye exams? [facility] If pt is not established with a provider, would they like to be referred to a provider to establish care? No .   Dental Screening: Recommended annual dental exams for proper oral hygiene  Community Resource Referral / Chronic Care Management: CRR required this visit?  No   CCM required this visit?  No      Plan:     I have personally reviewed and noted the following in the patient's chart:   . Medical and social history . Use of alcohol, tobacco or illicit drugs  . Current medications and supplements . Functional ability and status . Nutritional status . Physical activity . Advanced directives . List of other physicians . Hospitalizations, surgeries, and ER visits in previous 12 months . Vitals . Screenings to include cognitive, depression, and falls . Referrals and appointments  In addition, I have reviewed and discussed with patient certain preventive protocols, quality metrics, and best practice recommendations. A written personalized care plan for preventive services as well as general preventive health recommendations were provided to patient.     Sheral Flow, LPN   12/16/2749   Nurse Notes:  Patient is cogitatively intact. There were no  vitals filed for this visit. Patient refused.

## 2019-12-08 ENCOUNTER — Other Ambulatory Visit: Payer: Self-pay | Admitting: Family

## 2019-12-12 ENCOUNTER — Other Ambulatory Visit: Payer: Self-pay

## 2019-12-12 ENCOUNTER — Ambulatory Visit (INDEPENDENT_AMBULATORY_CARE_PROVIDER_SITE_OTHER): Payer: Medicare Other | Admitting: Internal Medicine

## 2019-12-12 ENCOUNTER — Encounter: Payer: Self-pay | Admitting: Internal Medicine

## 2019-12-12 ENCOUNTER — Ambulatory Visit: Payer: PRIVATE HEALTH INSURANCE | Admitting: Internal Medicine

## 2019-12-12 VITALS — BP 132/84 | HR 88 | Temp 98.2°F | Ht 62.0 in | Wt 110.0 lb

## 2019-12-12 DIAGNOSIS — E559 Vitamin D deficiency, unspecified: Secondary | ICD-10-CM | POA: Diagnosis not present

## 2019-12-12 DIAGNOSIS — E039 Hypothyroidism, unspecified: Secondary | ICD-10-CM

## 2019-12-12 DIAGNOSIS — R5383 Other fatigue: Secondary | ICD-10-CM | POA: Diagnosis not present

## 2019-12-12 DIAGNOSIS — E538 Deficiency of other specified B group vitamins: Secondary | ICD-10-CM | POA: Diagnosis not present

## 2019-12-12 LAB — TSH: TSH: 3.61 mIU/L (ref 0.40–4.50)

## 2019-12-12 LAB — VITAMIN B12: Vitamin B-12: 849 pg/mL (ref 200–1100)

## 2019-12-12 LAB — VITAMIN D 25 HYDROXY (VIT D DEFICIENCY, FRACTURES): Vit D, 25-Hydroxy: 37 ng/mL (ref 30–100)

## 2019-12-12 NOTE — Patient Instructions (Signed)
We can check the labs today to make sure that the levels are okay.

## 2019-12-12 NOTE — Progress Notes (Signed)
   Subjective:   Patient ID: Marilyn Blake, female    DOB: 1940/12/27, 79 y.o.   MRN: [id number]  HPI The patien tis an 79 YO female coming in for concerns about fatigue. She is more depressed recently due to life changes and family problems. She is worried about her health some. Denies SI/HI. Overall tired all the time. Sleeping okay. Denies heat or cold intolerance. Started months ago and overall stable.   Review of Systems  Constitutional: Negative.   HENT: Negative.   Eyes: Negative.   Respiratory: Negative for cough, chest tightness and shortness of breath.   Cardiovascular: Negative for chest pain, palpitations and leg swelling.  Gastrointestinal: Negative for abdominal distention, abdominal pain, constipation, diarrhea, nausea and vomiting.  Musculoskeletal: Negative.   Skin: Negative.   Neurological: Negative.   Psychiatric/Behavioral: Negative.     Objective:  Physical Exam Constitutional:      Appearance: She is well-developed.  HENT:     Head: Normocephalic and atraumatic.  Cardiovascular:     Rate and Rhythm: Normal rate and regular rhythm.  Pulmonary:     Effort: Pulmonary effort is normal. No respiratory distress.     Breath sounds: Normal breath sounds. No wheezing or rales.  Abdominal:     General: Bowel sounds are normal. There is no distension.     Palpations: Abdomen is soft.     Tenderness: There is no abdominal tenderness. There is no rebound.  Musculoskeletal:     Cervical back: Normal range of motion.  Skin:    General: Skin is warm and dry.  Neurological:     Mental Status: She is alert and oriented to person, place, and time.     Coordination: Coordination normal.     Vitals:   12/12/19 1530  BP: (!) 132/84  Pulse: 88  Temp: 98.2 F (36.8 C)  TempSrc: Oral  SpO2: 94%  Weight: 110 lb (49.9 kg)  Height: 5\' 2"  (1.575 m)    This visit occurred during the SARS-CoV-2 public health emergency.  Safety protocols were in place, including screening  questions prior to the visit, additional usage of staff PPE, and extensive cleaning of exam room while observing appropriate contact time as indicated for disinfecting solutions.   Assessment & Plan:

## 2019-12-15 ENCOUNTER — Encounter: Payer: Self-pay | Admitting: Internal Medicine

## 2019-12-15 DIAGNOSIS — R5383 Other fatigue: Secondary | ICD-10-CM | POA: Insufficient documentation

## 2019-12-15 NOTE — Assessment & Plan Note (Signed)
Checking TSH and B12 and vitamin D. Adjust as needed. She declines medication for depression at this time wants to try other coping skills.

## 2019-12-18 ENCOUNTER — Ambulatory Visit: Payer: Medicare Other | Admitting: Internal Medicine

## 2020-01-27 ENCOUNTER — Other Ambulatory Visit: Payer: Self-pay | Admitting: Internal Medicine

## 2020-02-16 ENCOUNTER — Ambulatory Visit: Payer: PRIVATE HEALTH INSURANCE | Admitting: Family

## 2020-03-29 ENCOUNTER — Other Ambulatory Visit: Payer: Self-pay | Admitting: Internal Medicine

## 2020-03-29 ENCOUNTER — Other Ambulatory Visit: Payer: Self-pay | Admitting: Family

## 2020-03-29 DIAGNOSIS — Z23 Encounter for immunization: Secondary | ICD-10-CM | POA: Diagnosis not present

## 2020-03-29 NOTE — Telephone Encounter (Signed)
Check [place] registry last filled 12/08/2019.Marland KitchenJohny Chess

## 2020-04-02 MED ORDER — LEVOTHYROXINE SODIUM 50 MCG PO TABS
50.0000 ug | ORAL_TABLET | ORAL | 0 refills | Status: DC
Start: 2020-04-02 — End: 2020-04-29

## 2020-04-02 MED ORDER — LEVOTHYROXINE SODIUM 75 MCG PO TABS: ORAL_TABLET | ORAL | 0 refills | Status: DC

## 2020-04-02 NOTE — Telephone Encounter (Signed)
Pt has not been seen for meds since 12/2018.  LVM for pt to call back to schedule OV.

## 2020-04-02 NOTE — Telephone Encounter (Signed)
   Patient checking status of refill for levothyroxine (EUTHYROX) 75 MCG tablet levothyroxine (SYNTHROID) 50 MCG tablet

## 2020-04-03 ENCOUNTER — Other Ambulatory Visit: Payer: Self-pay | Admitting: Internal Medicine

## 2020-04-09 DIAGNOSIS — R519 Headache, unspecified: Secondary | ICD-10-CM | POA: Diagnosis not present

## 2020-04-09 DIAGNOSIS — Z961 Presence of intraocular lens: Secondary | ICD-10-CM | POA: Diagnosis not present

## 2020-04-09 DIAGNOSIS — H04123 Dry eye syndrome of bilateral lacrimal glands: Secondary | ICD-10-CM | POA: Diagnosis not present

## 2020-04-17 DIAGNOSIS — Z23 Encounter for immunization: Secondary | ICD-10-CM | POA: Diagnosis not present

## 2020-04-19 ENCOUNTER — Encounter: Payer: Self-pay | Admitting: Internal Medicine

## 2020-04-29 ENCOUNTER — Ambulatory Visit (INDEPENDENT_AMBULATORY_CARE_PROVIDER_SITE_OTHER): Payer: Medicare Other | Admitting: Internal Medicine

## 2020-04-29 ENCOUNTER — Encounter: Payer: Self-pay | Admitting: Internal Medicine

## 2020-04-29 ENCOUNTER — Other Ambulatory Visit: Payer: Self-pay

## 2020-04-29 DIAGNOSIS — K219 Gastro-esophageal reflux disease without esophagitis: Secondary | ICD-10-CM | POA: Diagnosis not present

## 2020-04-29 MED ORDER — LEVOTHYROXINE SODIUM 50 MCG PO TABS
50.0000 ug | ORAL_TABLET | ORAL | 3 refills | Status: DC
Start: 2020-04-29 — End: 2020-11-05

## 2020-04-29 MED ORDER — LEVOTHYROXINE SODIUM 75 MCG PO TABS: ORAL_TABLET | ORAL | 3 refills | Status: DC

## 2020-04-29 MED ORDER — FAMOTIDINE 40 MG PO TABS: 40.0000 mg | ORAL_TABLET | Freq: Every day | ORAL | 3 refills | Status: DC

## 2020-04-29 NOTE — Patient Instructions (Addendum)
We will send in pepcid to use as needed if stomach is acting up. You can try a probiotic over the counter also.   Food Choices for Gastroesophageal Reflux Disease, Adult When you have gastroesophageal reflux disease (GERD), the foods you eat and your eating habits are very important. Choosing the right foods can help ease your discomfort. Think about working with a nutrition specialist (dietitian) to help you make good choices. What are tips for following this plan?  Meals  Choose healthy foods that are low in fat, such as fruits, vegetables, whole grains, low-fat dairy products, and lean meat, fish, and poultry.  Eat small meals often instead of 3 large meals a day. Eat your meals slowly, and in a place where you are relaxed. Avoid bending over or lying down until 2-3 hours after eating.  Avoid eating meals 2-3 hours before bed.  Avoid drinking a lot of liquid with meals.  Cook foods using methods other than frying. Bake, grill, or broil food instead.  Avoid or limit: ? Chocolate. ? Peppermint or spearmint. ? Alcohol. ? Pepper. ? Black and decaffeinated coffee. ? Black and decaffeinated tea. ? Bubbly (carbonated) soft drinks. ? Caffeinated energy drinks and soft drinks.  Limit high-fat foods such as: ? Fatty meat or fried foods. ? Whole milk, cream, butter, or ice cream. ? Nuts and nut butters. ? Pastries, donuts, and sweets made with butter or shortening.  Avoid foods that cause symptoms. These foods may be different for everyone. Common foods that cause symptoms include: ? Tomatoes. ? Oranges, lemons, and limes. ? Peppers. ? Spicy food. ? Onions and garlic. ? Vinegar. Lifestyle  Maintain a healthy weight. Ask your doctor what weight is healthy for you. If you need to lose weight, work with your doctor to do so safely.  Exercise for at least 30 minutes for 5 or more days each week, or as told by your doctor.  Wear loose-fitting clothes.  Do not smoke. If you need  help quitting, ask your doctor.  Sleep with the head of your bed higher than your feet. Use a wedge under the mattress or blocks under the bed frame to raise the head of the bed. Summary  When you have gastroesophageal reflux disease (GERD), food and lifestyle choices are very important in easing your symptoms.  Eat small meals often instead of 3 large meals a day. Eat your meals slowly, and in a place where you are relaxed.  Limit high-fat foods such as fatty meat or fried foods.  Avoid bending over or lying down until 2-3 hours after eating.  Avoid peppermint and spearmint, caffeine, alcohol, and chocolate. This information is not intended to replace advice given to you by your health care provider. Make sure you discuss any questions you have with your health care provider. Document Revised: 08/25/2018 Document Reviewed: 06/09/2016 Elsevier Patient Education  [place].

## 2020-04-29 NOTE — Progress Notes (Signed)
   Subjective:   Patient ID: Marilyn Blake, female    DOB: Jan 28, 1941, 79 y.o.   MRN: [id number]  HPI The patient is an 79 YO female coming in for concerns about GERD and mild nausea after eating. Last colonoscopy 2019 with Dr. Oletta Lamas. She is taking prilosec 20 mg daily. She does mostly get relief from GERD symptoms with this. Lately gets mild nausea after eating if she eats too much. Sometimes certain foods will give her gas which takes several days to clear. She has not noticed a specific pattern.   Review of Systems  Constitutional: Negative.   HENT: Negative.   Eyes: Negative.   Respiratory: Negative for cough, chest tightness and shortness of breath.   Cardiovascular: Negative for chest pain, palpitations and leg swelling.  Gastrointestinal: Positive for nausea. Negative for abdominal distention, abdominal pain, constipation, diarrhea and vomiting.  Musculoskeletal: Negative.   Skin: Negative.   Neurological: Negative.   Psychiatric/Behavioral: Negative.     Objective:  Physical Exam Constitutional:      Appearance: She is well-developed and well-nourished.  HENT:     Head: Normocephalic and atraumatic.  Eyes:     Extraocular Movements: EOM normal.  Cardiovascular:     Rate and Rhythm: Normal rate and regular rhythm.  Pulmonary:     Effort: Pulmonary effort is normal. No respiratory distress.     Breath sounds: Normal breath sounds. No wheezing or rales.  Abdominal:     General: Bowel sounds are normal. There is no distension.     Palpations: Abdomen is soft.     Tenderness: There is no abdominal tenderness. There is no rebound.  Musculoskeletal:        General: No edema.     Cervical back: Normal range of motion.  Skin:    General: Skin is warm and dry.  Neurological:     Mental Status: She is alert and oriented to person, place, and time.     Coordination: Coordination normal.  Psychiatric:        Mood and Affect: Mood and affect normal.     Vitals:   04/29/20  1501  BP: 116/70  Pulse: 74  Temp: 97.7 F (36.5 C)  TempSrc: Oral  SpO2: 96%  Weight: 110 lb 12.8 oz (50.3 kg)  Height: 5\' 2"  (1.575 m)    This visit occurred during the SARS-CoV-2 public health emergency.  Safety protocols were in place, including screening questions prior to the visit, additional usage of staff PPE, and extensive cleaning of exam room while observing appropriate contact time as indicated for disinfecting solutions.   Assessment & Plan:

## 2020-04-30 NOTE — Assessment & Plan Note (Signed)
Add pepcid at night time to her prilosec 20 mg daily. If needed can switch that to protonix 40 mg daily if adding pepcid does not help. Prior EGD and another not indicated at this time.

## 2020-05-20 ENCOUNTER — Ambulatory Visit: Payer: Medicare Other | Admitting: Internal Medicine

## 2020-06-07 ENCOUNTER — Telehealth: Payer: Self-pay | Admitting: Internal Medicine

## 2020-06-07 NOTE — Telephone Encounter (Signed)
Patient requesting to speak with you, states she saw Dr. Sharlet Salina in December and there was a change she wanted to talk to you about.

## 2020-06-14 ENCOUNTER — Telehealth: Payer: Self-pay | Admitting: Internal Medicine

## 2020-06-14 DIAGNOSIS — R110 Nausea: Secondary | ICD-10-CM

## 2020-06-14 NOTE — Telephone Encounter (Signed)
See below

## 2020-06-14 NOTE — Telephone Encounter (Signed)
Referral to GI done, it will likely take some time to get in with them. If she wants we can check a CT scan of the abdomen in the meantime to look for cause.

## 2020-06-14 NOTE — Telephone Encounter (Signed)
Patient called and said she is still having nausea and she was wondering if she could get a referral to GI.

## 2020-06-26 ENCOUNTER — Other Ambulatory Visit: Payer: Self-pay | Admitting: Internal Medicine

## 2020-06-26 DIAGNOSIS — Z1231 Encounter for screening mammogram for malignant neoplasm of breast: Secondary | ICD-10-CM

## 2020-07-04 ENCOUNTER — Other Ambulatory Visit: Payer: Self-pay | Admitting: Internal Medicine

## 2020-07-05 NOTE — Telephone Encounter (Signed)
See below

## 2020-07-08 ENCOUNTER — Other Ambulatory Visit: Payer: Self-pay | Admitting: Family

## 2020-07-11 ENCOUNTER — Ambulatory Visit: Payer: Medicare Other | Admitting: Internal Medicine

## 2020-07-15 ENCOUNTER — Encounter: Payer: Self-pay | Admitting: Internal Medicine

## 2020-07-15 ENCOUNTER — Ambulatory Visit (INDEPENDENT_AMBULATORY_CARE_PROVIDER_SITE_OTHER): Payer: Medicare Other | Admitting: Internal Medicine

## 2020-07-15 ENCOUNTER — Other Ambulatory Visit: Payer: Self-pay

## 2020-07-15 VITALS — BP 118/60 | HR 78 | Temp 97.7°F | Resp 18 | Ht 62.0 in | Wt 107.2 lb

## 2020-07-15 DIAGNOSIS — J301 Allergic rhinitis due to pollen: Secondary | ICD-10-CM

## 2020-07-15 MED ORDER — MONTELUKAST SODIUM 10 MG PO TABS: 10.0000 mg | ORAL_TABLET | Freq: Every day | ORAL | 3 refills | Status: DC

## 2020-07-15 NOTE — Patient Instructions (Signed)
We have sent in singulair to take 1 pill daily.

## 2020-07-15 NOTE — Progress Notes (Signed)
   Subjective:   Patient ID: Marilyn Blake, female    DOB: 1940/09/30, 80 y.o.   MRN: [id number]  HPI The patient is an 80 YO female coming in for problems with sinuses long term. She has had clogged feeling in her ears for a long time. Worse in the past several weeks. She does get some bleeding with otc allergy medications as they dry her out too much. Denies fevers or chills. Denies cough or throat congestion or post nasal drip. Overall stable. Using zyrtec and flonase currently.   Review of Systems  Constitutional: Negative.   HENT: Positive for congestion. Negative for postnasal drip, rhinorrhea, sinus pressure, sinus pain and sore throat.   Eyes: Negative.   Respiratory: Negative for cough, chest tightness and shortness of breath.   Cardiovascular: Negative for chest pain, palpitations and leg swelling.  Gastrointestinal: Negative for abdominal distention, abdominal pain, constipation, diarrhea, nausea and vomiting.  Musculoskeletal: Negative.   Skin: Negative.   Neurological: Negative.   Psychiatric/Behavioral: Negative.     Objective:  Physical Exam Constitutional:      Appearance: She is well-developed and well-nourished.  HENT:     Head: Normocephalic and atraumatic.  Eyes:     Extraocular Movements: EOM normal.  Cardiovascular:     Rate and Rhythm: Normal rate and regular rhythm.  Pulmonary:     Effort: Pulmonary effort is normal. No respiratory distress.     Breath sounds: Normal breath sounds. No wheezing or rales.  Abdominal:     General: Bowel sounds are normal. There is no distension.     Palpations: Abdomen is soft.     Tenderness: There is no abdominal tenderness. There is no rebound.  Musculoskeletal:        General: No edema.     Cervical back: Normal range of motion.  Skin:    General: Skin is warm and dry.  Neurological:     Mental Status: She is alert and oriented to person, place, and time.     Coordination: Coordination normal.  Psychiatric:         Mood and Affect: Mood and affect normal.     Vitals:   07/15/20 1421  BP: 118/60  Pulse: 78  Resp: 18  Temp: 97.7 F (36.5 C)  TempSrc: Oral  SpO2: 97%  Weight: 107 lb 3.2 oz (48.6 kg)  Height: 5\' 2"  (1.575 m)    This visit occurred during the SARS-CoV-2 public health emergency.  Safety protocols were in place, including screening questions prior to the visit, additional usage of staff PPE, and extensive cleaning of exam room while observing appropriate contact time as indicated for disinfecting solutions.   Assessment & Plan:

## 2020-07-17 ENCOUNTER — Other Ambulatory Visit: Payer: Self-pay | Admitting: Physician Assistant

## 2020-07-17 DIAGNOSIS — R1013 Epigastric pain: Secondary | ICD-10-CM | POA: Diagnosis not present

## 2020-07-17 DIAGNOSIS — K589 Irritable bowel syndrome without diarrhea: Secondary | ICD-10-CM | POA: Diagnosis not present

## 2020-07-17 DIAGNOSIS — R110 Nausea: Secondary | ICD-10-CM

## 2020-07-17 DIAGNOSIS — K219 Gastro-esophageal reflux disease without esophagitis: Secondary | ICD-10-CM | POA: Diagnosis not present

## 2020-07-17 DIAGNOSIS — R140 Abdominal distension (gaseous): Secondary | ICD-10-CM | POA: Diagnosis not present

## 2020-07-18 NOTE — Assessment & Plan Note (Signed)
Will switch from zyrtec to singulair which is prescribed today to see if this helps better. We talked about overall change in levels of pollen in the environment which could be worsening symptoms.

## 2020-07-22 ENCOUNTER — Other Ambulatory Visit: Payer: Self-pay | Admitting: Internal Medicine

## 2020-07-23 ENCOUNTER — Other Ambulatory Visit: Payer: Self-pay | Admitting: Internal Medicine

## 2020-07-31 ENCOUNTER — Other Ambulatory Visit: Payer: Medicare Other

## 2020-07-31 ENCOUNTER — Ambulatory Visit
Admission: RE | Admit: 2020-07-31 | Discharge: 2020-07-31 | Disposition: A | Discharge status: Home / Self Care | Payer: Medicare Other | Source: Ambulatory Visit | Attending: Physician Assistant | Admitting: Physician Assistant

## 2020-07-31 DIAGNOSIS — R1013 Epigastric pain: Secondary | ICD-10-CM | POA: Diagnosis not present

## 2020-07-31 DIAGNOSIS — K7689 Other specified diseases of liver: Secondary | ICD-10-CM | POA: Diagnosis not present

## 2020-07-31 DIAGNOSIS — R110 Nausea: Secondary | ICD-10-CM

## 2020-08-08 DIAGNOSIS — R1013 Epigastric pain: Secondary | ICD-10-CM | POA: Diagnosis not present

## 2020-08-16 ENCOUNTER — Other Ambulatory Visit: Payer: Self-pay

## 2020-08-16 ENCOUNTER — Ambulatory Visit
Admission: RE | Admit: 2020-08-16 | Discharge: 2020-08-16 | Disposition: A | Discharge status: Home / Self Care | Payer: Medicare Other | Source: Ambulatory Visit | Attending: Internal Medicine | Admitting: Internal Medicine

## 2020-08-16 DIAGNOSIS — Z1231 Encounter for screening mammogram for malignant neoplasm of breast: Secondary | ICD-10-CM

## 2020-09-06 ENCOUNTER — Other Ambulatory Visit: Payer: Self-pay | Admitting: Internal Medicine

## 2020-09-25 ENCOUNTER — Other Ambulatory Visit: Payer: Self-pay | Admitting: Internal Medicine

## 2020-10-17 ENCOUNTER — Telehealth: Payer: Self-pay | Admitting: Internal Medicine

## 2020-10-17 ENCOUNTER — Ambulatory Visit: Payer: Medicare Other | Admitting: Internal Medicine

## 2020-10-17 NOTE — Telephone Encounter (Signed)
Ok to see me  (RN held spot ok) or APP next available

## 2020-10-17 NOTE — Telephone Encounter (Signed)
Primary Pulmonologist: Dr. Annamaria Boots Last office visit and with whom: 11/13/19 Dr. Annamaria Boots What do we see them for (pulmonary problems): bronchiectasis, microbacterium  Last OV assessment/plan:  Instructions    Return in about 6 months (around 05/14/2020). Script sent for doxycycline antibiotic  Ok to stay hydrated and to take otc meds for symptoms as needed  Please call if you don't do well.          Reason for call:  Called and spoke with Patient.  Patient stated she has been having on and off cough for several months.   Patient stated she has productive cough at times, with green sputum.  Patient stated she feels like her ears are under water, stopped up.  Patient stated she doesn't go out much, but wears mask when she does, because of allergies.  Patient stated she feels like she may have drainage at night, because of the bad taste in her mouth.  Patient stated she uses zyrtec and mouth wash daily.   Patient request any prescriptions to go to [facility].  Message routed to Dr. Annamaria Boots to advise     Allergies  Allergen Reactions  . Ethambutol Hcl     REACTION: stomach pain  . Moxifloxacin     REACTION: mild rash - causal??    Immunization History  Administered Date(s) Administered  . Fluad Quad(high Dose 65+) 01/25/2019  . Influenza Split 02/03/2011, 02/15/2012  . Influenza Whole 02/16/2008, 03/25/2009, 02/03/2010  . Influenza, High Dose Seasonal PF 02/11/2016, 03/12/2018  . Influenza,inj,Quad PF,6+ Mos 01/18/2013, 03/15/2014, 01/31/2015  . Influenza-Unspecified 01/31/2015, 04/01/2017, 03/12/2018, 03/29/2020  . PFIZER(Purple Top)SARS-COV-2 Vaccination 07/11/2019, 08/01/2019, 04/17/2020  . Pneumococcal Conjugate-13 03/27/2014  . Pneumococcal Polysaccharide-23 08/14/2008  . Td 03/27/2009  . Tdap 09/20/2019

## 2020-10-17 NOTE — Telephone Encounter (Signed)
Spoke to patient and scheduled OV for 10/18/2020 at 11:30. Nothing further needed at this time.

## 2020-10-17 NOTE — Progress Notes (Signed)
Patient ID: Marilyn Blake, female    DOB: 10/20/1940, 80 y.o.   MRN: [id number]  HPI 02/03/2011-80 year old female never smoker followed for bronchiectasis, history of MAIC bronchitis, allergic rhinitis.   --------------------------------------------------------------------------------------   11/13/19- 80 year old female never smoker followed for bronchiectasis, recurrent MAIC bronchitis, allergic rhinitis, insomnia, complicated by hypothyroid, IBS ------cough, chills and runny nose Called [place] last week wanting otc suggestions for same symptoms she c/o today. Not well for at least a week. Some chill, T100. No urine or GI symptoms.  . Dtr had similar but shook it off. Yesterday sputum was yellow-green.  Had 2 Phizer Covax. CXR 01/25/2019- IMPRESSION: 1. 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 No acute findings.  10/18/20- 80 year old female never smoker followed for bronchiectasis, recurrent MAIC bronchitis, allergic rhinitis, insomnia, complicated by hypothyroid, IBS -Singulair,  Covid vax- -----Pt states her ears are bothering her, she feels like she is in a tunnel and feels like her equilibrium is off. Coughing up sputum that is green, pt states she has a history of microbacterium. Off and on since  Christmas, esp if goes outside. Hx MAIC treated 15 yrs ago [place] with 3 drugs that upset her stomach.  Review of Systems- see HPI Constitutional:   No-   weight loss, +night sweats, , chills, +fatigue, lassitude. HEENT:   +  headaches, difficulty swallowing, tooth/dental problems, sore throat,       No-  sneezing, itching, ear ache, +nasal congestion, post nasal drip,  CV:  atypicalchest pain, orthopnea, PND, swelling in lower extremities, anasarca,dizziness, palpitations Resp: + shortness of breath with exertion or at rest.             + productive cough,   non-productive cough,   no- coughing up of blood.              No- change in color of mucus.  No- wheezing.   Skin: No-   rash or lesions. GI:  No-   heartburn, indigestion, abdominal pain, nausea, vomiting,  GU:  MS:  No-   joint pain or swelling. . Neuro- grossly normal to observation,   Psych:  No- change in mood or affect. No depression or anxiety.  No memory loss.  Objective:   Physical Exam General- Alert, Oriented, Affect-appropriate, Distress- none acute, petite/ thin lady/ frail Skin- rash-none, lesions- none, excoriation- none Lymphadenopathy- none Head- atraumatic            Eyes- Gross vision intact, PERRLA, conjunctivae clear secretions            Ears-TMs and canals- normal and clear            Nose- Clear, No-Septal dev, mucus, polyps, erosion, perforation             Throat- Mallampati II , mucosa + red tonsil crypts/ no exudate , derainage- none, tonsils- atrophic, Neck- flexible , trachea midline, no stridor , thyroid nl, carotid no bruit Chest - symmetrical excursion , unlabored           Heart/CV- RRR , no murmur , no gallop  , no rub, nl s1 s2                           - JVD- none , edema- none, stasis changes- none, varices- none           Lung-  Unlabored,+ few squeaks  wheeze-none,  cough- none , dullness-none, rub-none           Chest wall-  Abd- Br/ Gen/ Rectal- Not done, not indicated Extrem- cyanosis- none, clubbing, none, atrophy- none, strength- nl Neuro- grossly intact to observation

## 2020-10-18 ENCOUNTER — Telehealth: Payer: Self-pay | Admitting: Internal Medicine

## 2020-10-18 ENCOUNTER — Ambulatory Visit (INDEPENDENT_AMBULATORY_CARE_PROVIDER_SITE_OTHER): Payer: Medicare Other | Admitting: Internal Medicine

## 2020-10-18 ENCOUNTER — Encounter: Payer: Self-pay | Admitting: Internal Medicine

## 2020-10-18 ENCOUNTER — Other Ambulatory Visit: Payer: Self-pay | Admitting: Internal Medicine

## 2020-10-18 ENCOUNTER — Ambulatory Visit (INDEPENDENT_AMBULATORY_CARE_PROVIDER_SITE_OTHER)
Admission: RE | Admit: 2020-10-18 | Discharge: 2020-10-18 | Disposition: A | Discharge status: Home / Self Care | Payer: Medicare Other | Source: Ambulatory Visit | Attending: Internal Medicine | Admitting: Internal Medicine

## 2020-10-18 ENCOUNTER — Other Ambulatory Visit: Payer: Self-pay

## 2020-10-18 VITALS — BP 118/70 | HR 81 | Temp 98.1°F | Ht 62.0 in | Wt 109.8 lb

## 2020-10-18 DIAGNOSIS — I517 Cardiomegaly: Secondary | ICD-10-CM | POA: Diagnosis not present

## 2020-10-18 DIAGNOSIS — J479 Bronchiectasis, uncomplicated: Secondary | ICD-10-CM | POA: Diagnosis not present

## 2020-10-18 DIAGNOSIS — J301 Allergic rhinitis due to pollen: Secondary | ICD-10-CM | POA: Diagnosis not present

## 2020-10-18 DIAGNOSIS — J441 Chronic obstructive pulmonary disease with (acute) exacerbation: Secondary | ICD-10-CM | POA: Diagnosis not present

## 2020-10-18 MED ORDER — DOXYCYCLINE HYCLATE 100 MG PO TABS: 100.0000 mg | ORAL_TABLET | Freq: Two times a day (BID) | ORAL | 0 refills | Status: DC

## 2020-10-18 NOTE — Patient Instructions (Signed)
Script sent for doxycycline  Order- Sputum C&S routine, AFB/ immunofluorescence, fungal  Dx bronchiectasis  Order- CXR  Dx bronchiectasis

## 2020-10-18 NOTE — Telephone Encounter (Signed)
Spoke with the pt  She was asking if Dr Annamaria Boots wanted her to take doxy  I advised yes, b/c he discussed this on AVS from today and sent the medication to her pharmacy  She verbalized understanding  Nothing further needed

## 2020-10-30 ENCOUNTER — Ambulatory Visit: Payer: Medicare Other | Admitting: Internal Medicine

## 2020-11-05 ENCOUNTER — Telehealth: Payer: Self-pay | Admitting: Internal Medicine

## 2020-11-05 ENCOUNTER — Ambulatory Visit (INDEPENDENT_AMBULATORY_CARE_PROVIDER_SITE_OTHER): Payer: Medicare Other | Admitting: Internal Medicine

## 2020-11-05 ENCOUNTER — Other Ambulatory Visit: Payer: Self-pay

## 2020-11-05 ENCOUNTER — Encounter: Payer: Self-pay | Admitting: Internal Medicine

## 2020-11-05 ENCOUNTER — Other Ambulatory Visit: Payer: Self-pay | Admitting: Family

## 2020-11-05 VITALS — BP 122/72 | HR 71 | Temp 97.7°F | Resp 18 | Ht 62.0 in | Wt 106.8 lb

## 2020-11-05 DIAGNOSIS — E039 Hypothyroidism, unspecified: Secondary | ICD-10-CM

## 2020-11-05 DIAGNOSIS — F419 Anxiety disorder, unspecified: Secondary | ICD-10-CM

## 2020-11-05 DIAGNOSIS — F5105 Insomnia due to other mental disorder: Secondary | ICD-10-CM | POA: Diagnosis not present

## 2020-11-05 DIAGNOSIS — E782 Mixed hyperlipidemia: Secondary | ICD-10-CM

## 2020-11-05 DIAGNOSIS — J479 Bronchiectasis, uncomplicated: Secondary | ICD-10-CM

## 2020-11-05 DIAGNOSIS — K219 Gastro-esophageal reflux disease without esophagitis: Secondary | ICD-10-CM | POA: Diagnosis not present

## 2020-11-05 LAB — LIPID PANEL
Cholesterol: 168 mg/dL (ref 0–200)
HDL: 56.8 mg/dL (ref >39.00)
LDL Cholesterol: 91 mg/dL (ref 0–99)
NonHDL: 110.95
Total CHOL/HDL Ratio: 3
Triglycerides: 102 mg/dL (ref 0.0–149.0)
VLDL: 20.4 mg/dL (ref 0.0–40.0)

## 2020-11-05 LAB — COMPREHENSIVE METABOLIC PANEL
ALT: 12 U/L (ref 0–35)
AST: 24 U/L (ref 0–37)
Albumin: 4.1 g/dL (ref 3.5–5.2)
Alkaline Phosphatase: 101 U/L (ref 39–117)
BUN: 9 mg/dL (ref 6–23)
CO2: 31 mEq/L (ref 19–32)
Calcium: 9.7 mg/dL (ref 8.4–10.5)
Chloride: 100 mEq/L (ref 96–112)
Creatinine, Ser: 0.67 mg/dL (ref 0.40–1.20)
GFR: 82 mL/min (ref >60.00)
Glucose, Bld: 85 mg/dL (ref 70–99)
Potassium: 4.4 mEq/L (ref 3.5–5.1)
Sodium: 138 mEq/L (ref 135–145)
Total Bilirubin: 0.3 mg/dL (ref 0.2–1.2)
Total Protein: 8.2 g/dL (ref 6.0–8.3)

## 2020-11-05 LAB — CBC
HCT: 38.8 % (ref 36.0–46.0)
Hemoglobin: 13 g/dL (ref 12.0–15.0)
MCHC: 33.6 g/dL (ref 30.0–36.0)
MCV: 94.2 fl (ref 78.0–100.0)
Platelets: 222 10*3/uL (ref 150.0–400.0)
RBC: 4.12 Mil/uL (ref 3.87–5.11)
RDW: 13.4 % (ref 11.5–15.5)
WBC: 7.2 10*3/uL (ref 4.0–10.5)

## 2020-11-05 LAB — TSH: TSH: 1.94 u[IU]/mL (ref 0.35–4.50)

## 2020-11-05 MED ORDER — LEVOTHYROXINE SODIUM 75 MCG PO TABS: ORAL_TABLET | ORAL | 3 refills | Status: DC

## 2020-11-05 MED ORDER — LEVOTHYROXINE SODIUM 50 MCG PO TABS: 50.0000 ug | ORAL_TABLET | ORAL | 3 refills | Status: DC

## 2020-11-05 NOTE — Assessment & Plan Note (Signed)
Checking TSH and adjust synthroid 50 mcg alternating with 75 mcg daily.

## 2020-11-05 NOTE — Assessment & Plan Note (Signed)
Just took doxycycline for flare with sputum and is now feeling improved.

## 2020-11-05 NOTE — Telephone Encounter (Signed)
1.Medication Requested:diazepam (VALIUM) 5 MG tablet  levothyroxine (SYNTHROID) 50 MCG tablet  levothyroxine (SYNTHROID) 75 MCG tablet  2. Pharmacy (Name, Street, [place]):  [place], [place] - [address] Phone:  [phone]  Fax:  [phone]      3. On Med List: y  34. Last Visit with PCP: 6.21.22  5. Next visit date with PCP: not scheduled   Agent: Please be advised that RX refills may take up to 3 business days. We ask that you follow-up with your pharmacy.

## 2020-11-05 NOTE — Assessment & Plan Note (Signed)
Checking lipid panel and adjust simvastatin 20 mg daily.  °

## 2020-11-05 NOTE — Telephone Encounter (Signed)
Diazepam handled via med refill encounter. Okay to fill thyroid meds.

## 2020-11-05 NOTE — Assessment & Plan Note (Signed)
Can refill valium when needed. She does use rarely for sleep/anxiety.

## 2020-11-05 NOTE — Patient Instructions (Addendum)
We will check the blood work today.   You can take the singulair in the morning to see if it helps.

## 2020-11-05 NOTE — Progress Notes (Signed)
   Subjective:   Patient ID: Marilyn Blake, female    DOB: 1940/11/15, 80 y.o.   MRN: [id number]  HPI The patient is an 80 YO female coming in for follow up thyroid (taking synthroid 50 and 75 mcg alternating, denies heat or cold intolerance) and cholesterol (taking simvastatin 20 mg daily, denies side effects), and insomnia (uses valium rarely for anxiety).   Review of Systems  Constitutional: Negative.   HENT: Negative.    Eyes: Negative.   Respiratory:  Negative for cough, chest tightness and shortness of breath.   Cardiovascular:  Negative for chest pain, palpitations and leg swelling.  Gastrointestinal:  Negative for abdominal distention, abdominal pain, constipation, diarrhea, nausea and vomiting.  Musculoskeletal:  Positive for back pain.  Skin: Negative.   Neurological: Negative.   Psychiatric/Behavioral: Negative.     Objective:  Physical Exam Constitutional:      Appearance: She is well-developed.  HENT:     Head: Normocephalic and atraumatic.  Cardiovascular:     Rate and Rhythm: Normal rate and regular rhythm.  Pulmonary:     Effort: Pulmonary effort is normal. No respiratory distress.     Breath sounds: Normal breath sounds. No wheezing or rales.  Abdominal:     General: Bowel sounds are normal. There is no distension.     Palpations: Abdomen is soft.     Tenderness: There is no abdominal tenderness. There is no rebound.  Musculoskeletal:        General: Tenderness present.     Cervical back: Normal range of motion.  Skin:    General: Skin is warm and dry.  Neurological:     Mental Status: She is alert and oriented to person, place, and time.     Coordination: Coordination normal.    Vitals:   11/05/20 1023  BP: 122/72  Pulse: 71  Resp: 18  Temp: 97.7 F (36.5 C)  TempSrc: Oral  SpO2: 94%  Weight: 106 lb 12.8 oz (48.4 kg)  Height: 5\' 2"  (1.575 m)    This visit occurred during the SARS-CoV-2 public health emergency.  Safety protocols were in place,  including screening questions prior to the visit, additional usage of staff PPE, and extensive cleaning of exam room while observing appropriate contact time as indicated for disinfecting solutions.   Assessment & Plan:

## 2020-11-05 NOTE — Telephone Encounter (Signed)
Medication has been sent to the pharmacy. 

## 2020-11-05 NOTE — Assessment & Plan Note (Signed)
Taking pepcid and omeprazole daily.

## 2020-11-05 NOTE — Telephone Encounter (Signed)
See below

## 2020-11-25 ENCOUNTER — Ambulatory Visit: Payer: PRIVATE HEALTH INSURANCE

## 2020-12-19 ENCOUNTER — Ambulatory Visit (INDEPENDENT_AMBULATORY_CARE_PROVIDER_SITE_OTHER): Payer: Medicare Other | Admitting: Internal Medicine

## 2020-12-19 ENCOUNTER — Encounter: Payer: Self-pay | Admitting: Internal Medicine

## 2020-12-19 ENCOUNTER — Other Ambulatory Visit: Payer: Self-pay

## 2020-12-19 VITALS — BP 126/70 | HR 84 | Temp 98.0°F | Resp 18 | Ht 62.0 in | Wt 107.6 lb

## 2020-12-19 DIAGNOSIS — R309 Painful micturition, unspecified: Secondary | ICD-10-CM

## 2020-12-19 LAB — POCT URINALYSIS DIPSTICK
Blood, UA: NEGATIVE
Glucose, UA: NEGATIVE
Nitrite, UA: POSITIVE
Protein, UA: NEGATIVE
Spec Grav, UA: 1.015 (ref 1.010–1.025)
Urobilinogen, UA: 2 E.U./dL — AB
pH, UA: 6 (ref 5.0–8.0)

## 2020-12-19 MED ORDER — CEPHALEXIN 500 MG PO CAPS: 500.0000 mg | ORAL_CAPSULE | Freq: Two times a day (BID) | ORAL | 0 refills | Status: DC

## 2020-12-19 MED ORDER — OMEPRAZOLE 20 MG PO CPDR: 20.0000 mg | DELAYED_RELEASE_CAPSULE | Freq: Every day | ORAL | 3 refills | Status: DC

## 2020-12-19 NOTE — Progress Notes (Signed)
   Subjective:   Patient ID: Marilyn Blake, female    DOB: [birth year], 80 y.o.   MRN: [id number]  HPI The patient is an 80 YO female coming in for concerns about possible UTI. Started 1-2 days ago. Having pain with urination and feels like she has to go all the time.   Review of Systems  Constitutional: Negative.   Respiratory: Negative.    Cardiovascular: Negative.   Gastrointestinal:  Negative for abdominal distention, abdominal pain, constipation, diarrhea, nausea and vomiting.  Genitourinary:  Positive for dysuria, frequency and urgency.  Musculoskeletal: Negative.   Skin: Negative.    Objective:  Physical Exam Constitutional:      Appearance: She is well-developed.  HENT:     Head: Normocephalic and atraumatic.  Cardiovascular:     Rate and Rhythm: Normal rate and regular rhythm.  Pulmonary:     Effort: Pulmonary effort is normal. No respiratory distress.     Breath sounds: Normal breath sounds. No wheezing or rales.  Abdominal:     General: Bowel sounds are normal. There is no distension.     Palpations: Abdomen is soft.     Tenderness: There is no abdominal tenderness. There is no rebound.  Musculoskeletal:     Cervical back: Normal range of motion.  Skin:    General: Skin is warm and dry.  Neurological:     Mental Status: She is alert and oriented to person, place, and time.     Coordination: Coordination normal.    Vitals:   12/19/20 1046  BP: 126/70  Pulse: 84  Resp: 18  Temp: 98 F (36.7 C)  TempSrc: Oral  SpO2: 93%  Weight: 107 lb 9.6 oz (48.8 kg)  Height: '5\' 2"'$  (1.575 m)    This visit occurred during the SARS-CoV-2 public health emergency.  Safety protocols were in place, including screening questions prior to the visit, additional usage of staff PPE, and extensive cleaning of exam room while observing appropriate contact time as indicated for disinfecting solutions.   Assessment & Plan:

## 2020-12-19 NOTE — Patient Instructions (Signed)
We have sent in keflex to take 1 pill twice a day for 1 week to clear the infection.

## 2020-12-20 ENCOUNTER — Encounter: Payer: Self-pay | Admitting: Internal Medicine

## 2020-12-20 NOTE — Assessment & Plan Note (Signed)
POC U/A done in the office consistent with cystitis. Rx keflex 7 day course.

## 2020-12-23 ENCOUNTER — Telehealth: Payer: Self-pay | Admitting: Internal Medicine

## 2020-12-23 NOTE — Telephone Encounter (Signed)
See below

## 2020-12-23 NOTE — Telephone Encounter (Signed)
States she is has been taking the medication as instructed but it has not been helping with the pain. She is still having severe pain in her vaginal area. Requesting a callback to discuss further treatment options.   Please advise.

## 2020-12-23 NOTE — Telephone Encounter (Signed)
Patient states the Keflex is not working and she is requesting a "cream" to help with irritation

## 2020-12-24 ENCOUNTER — Other Ambulatory Visit: Payer: Self-pay | Admitting: Internal Medicine

## 2020-12-24 MED ORDER — NITROFURANTOIN MONOHYD MACRO 100 MG PO CAPS: 100.0000 mg | ORAL_CAPSULE | Freq: Two times a day (BID) | ORAL | 0 refills | Status: DC

## 2020-12-24 NOTE — Telephone Encounter (Signed)
Sent in alternate antibiotic.

## 2021-01-21 ENCOUNTER — Ambulatory Visit: Payer: Medicare Other | Admitting: Internal Medicine

## 2021-01-22 ENCOUNTER — Telehealth: Payer: Self-pay | Admitting: Internal Medicine

## 2021-01-22 NOTE — Telephone Encounter (Signed)
LVM for pt to rtn my call to schedule AWV with [facility]. Please schedule AWV if pt calls the office  

## 2021-02-12 ENCOUNTER — Telehealth: Payer: Self-pay | Admitting: Internal Medicine

## 2021-02-12 MED ORDER — SIMVASTATIN 20 MG PO TABS: 20.0000 mg | ORAL_TABLET | Freq: Every day | ORAL | 0 refills | Status: DC

## 2021-02-12 MED ORDER — FAMOTIDINE 40 MG PO TABS: 40.0000 mg | ORAL_TABLET | Freq: Every day | ORAL | 3 refills | Status: DC

## 2021-02-12 MED ORDER — OMEPRAZOLE 20 MG PO CPDR: 20.0000 mg | DELAYED_RELEASE_CAPSULE | Freq: Every day | ORAL | 3 refills | Status: DC

## 2021-02-12 NOTE — Telephone Encounter (Signed)
Refills have been sent to the pharmacy 

## 2021-02-12 NOTE — Telephone Encounter (Signed)
Patient request refill for  simvastatin (ZOCOR) 20 MG tablet  omeprazole (PRILOSEC) 20 MG capsule  famotidine (PEPCID) 40 MG tablet  montelukast (SINGULAIR) 10 MG tablet  diazepam (VALIUM) 5 MG tablet   [facility] [address] [place], [place]

## 2021-03-14 DIAGNOSIS — Z23 Encounter for immunization: Secondary | ICD-10-CM | POA: Diagnosis not present

## 2021-03-16 ENCOUNTER — Encounter: Payer: Self-pay | Admitting: Internal Medicine

## 2021-03-16 NOTE — Assessment & Plan Note (Signed)
Will manage as acute bronchitic exacerbation. Doubt this is MAIC, but will culture. Plan- doxycycline, CXXR, sputum cx

## 2021-03-16 NOTE — Assessment & Plan Note (Signed)
Possibly also some eustachian dysfunction Plan- doxy. Can also use sudafed if needed

## 2021-03-19 DIAGNOSIS — J3089 Other allergic rhinitis: Secondary | ICD-10-CM | POA: Diagnosis not present

## 2021-03-19 DIAGNOSIS — R300 Dysuria: Secondary | ICD-10-CM | POA: Diagnosis not present

## 2021-03-19 DIAGNOSIS — N898 Other specified noninflammatory disorders of vagina: Secondary | ICD-10-CM | POA: Diagnosis not present

## 2021-03-20 ENCOUNTER — Telehealth: Payer: Self-pay | Admitting: Internal Medicine

## 2021-03-20 NOTE — Telephone Encounter (Signed)
1.Medication Requested: simvastatin (ZOCOR) 20 MG tab diazepam (VALIUM) 5 MG tablet  2. Pharmacy (Name, Street, [place]): [place], [place]  3. On Med List: yes   4. Last Visit with PCP: 12-19-2020  5. Next visit date with PCP: n/a   Agent: Please be advised that RX refills may take up to 3 business days. We ask that you follow-up with your pharmacy.    Patient requesting a call back to discuss pneumonia vaccine   Please call [phone]

## 2021-03-20 NOTE — Telephone Encounter (Signed)
See below

## 2021-03-21 MED ORDER — SIMVASTATIN 20 MG PO TABS: 20.0000 mg | ORAL_TABLET | Freq: Every day | ORAL | 3 refills | Status: DC

## 2021-03-21 MED ORDER — DIAZEPAM 5 MG PO TABS: 5.0000 mg | ORAL_TABLET | Freq: Two times a day (BID) | ORAL | 0 refills | Status: DC | PRN

## 2021-03-30 ENCOUNTER — Other Ambulatory Visit: Payer: Self-pay | Admitting: Internal Medicine

## 2021-05-15 ENCOUNTER — Other Ambulatory Visit: Payer: Self-pay

## 2021-05-15 ENCOUNTER — Ambulatory Visit (INDEPENDENT_AMBULATORY_CARE_PROVIDER_SITE_OTHER): Payer: Medicare Other | Admitting: Internal Medicine

## 2021-05-15 ENCOUNTER — Ambulatory Visit (INDEPENDENT_AMBULATORY_CARE_PROVIDER_SITE_OTHER): Payer: Medicare Other

## 2021-05-15 ENCOUNTER — Encounter: Payer: Self-pay | Admitting: Internal Medicine

## 2021-05-15 VITALS — BP 130/62 | HR 87 | Temp 97.8°F | Ht 62.0 in | Wt 104.6 lb

## 2021-05-15 DIAGNOSIS — R1011 Right upper quadrant pain: Secondary | ICD-10-CM | POA: Insufficient documentation

## 2021-05-15 DIAGNOSIS — J479 Bronchiectasis, uncomplicated: Secondary | ICD-10-CM

## 2021-05-15 DIAGNOSIS — Z23 Encounter for immunization: Secondary | ICD-10-CM | POA: Diagnosis not present

## 2021-05-15 NOTE — Patient Instructions (Addendum)
Order- CBC w diff, CMET   dx Bronchiectasis  Order- CXR    dx Bronchiectasis  Order- Ultrasound RUQ   dx RUQ pain  Order- Flutter Valve-    blow through 4 times per set, 3 sets per day, when needed to help keep your lungs clear.  Order- Prevnar 20 pneumonia vaccine

## 2021-05-15 NOTE — Assessment & Plan Note (Signed)
Recent hemoptysis would be consistent with an exacerbation of bronchiectasis but it is now resolved and she has no active symptoms of infection so we will watch. Plan-chest x-ray, CBC and chemistry, flutter valve for pulmonary toilet.  Watch need for CT.

## 2021-05-15 NOTE — Progress Notes (Signed)
Patient ID: Marilyn Blake, female    DOB: 1940/11/21, 80 y.o.   MRN: [id number]  HPI 02/03/2011-80 year old female never smoker followed for bronchiectasis, history of MAIC bronchitis, allergic rhinitis.   --------------------------------------------------------------------------------------   10/18/20- 80 year old female never smoker followed for bronchiectasis, recurrent MAIC bronchitis, allergic rhinitis, insomnia, complicated by hypothyroid, IBS -Singulair,  Covid vax- -----Pt states her ears are bothering her, she feels like she is in a tunnel and feels like her equilibrium is off. Coughing up sputum that is green, pt states she has a history of microbacterium. Off and on since  Christmas, esp if goes outside. Hx MAIC treated 15 yrs ago [place] with 3 drugs that upset her stomach.  05/15/21- 80 year old female never smoker followed for bronchiectasis, recurrent MAIC bronchitis, allergic rhinitis, insomnia, complicated by hypothyroid, IBS -Singulair,  Covid vax-3 Phizer Flu vax-had ---Wants to talk about pneumonia vaccine. States she has been coughing up some blood but only at night. States christmas night it was the size of a nickel, but last night was only a couple of specs. Has been taking Zyrtec at night and it has helped with the coughing Here with daughter.  We discussed her pneumonia vaccine status.  Benefit of additional vaccine probably small, but given her history, we decided to go ahead and give Prevnar-20. She had transient hemoptysis over Christmas, on no blood thinners.  It has now stopped.  Cough with scant sputum-mostly white.  She has lost some weight. Chest x-ray report referred to a previous CT scan but she does not think she has ever had 1.  Certainly none in recent years.  She was treated around 2005 with triple therapy for [facility], in [place] and had bronchoscopy at that time. Pain at right lower anterior costal margin mostly when lying down at night.   It may have begun around her right flank.  She has no urinary symptoms.  We agreed to evaluate first with abdominal ultrasound although she may need CT scans. CXR 10/18/20-  IMPRESSION: Redemonstrated extensive, coarse bilateral interstitial opacity and regions of fibrosis and bronchiectasis in the right middle lobe and lingula, as better detailed on prior CT. No acute airspace opacity.  Review of Systems- see HPI Constitutional:   No-   weight loss, +night sweats, , chills, +fatigue, lassitude. HEENT:   +  headaches, difficulty swallowing, tooth/dental problems, sore throat,       No-  sneezing, itching, ear ache, +nasal congestion, post nasal drip,  CV:  atypicalchest pain, orthopnea, PND, swelling in lower extremities, anasarca,dizziness, palpitations Resp: + shortness of breath with exertion or at rest.             + productive cough,   non-productive cough,  no- coughing up of blood.              No- change in color of mucus.  No- wheezing.   Skin: No-   rash or lesions. GI:  No-   heartburn, indigestion, abdominal pain, nausea, vomiting,  GU:  MS:  No-   joint pain or swelling. . Neuro- grossly normal to observation,   Psych:  No- change in mood or affect. No depression or anxiety.  No memory loss.  Objective:   Physical Exam General- Alert, Oriented, Affect-appropriate, Distress- none acute, petite/ thin lady/ frail Skin- rash-none, lesions- none, excoriation- none Lymphadenopathy- none Head- atraumatic            Eyes- Gross vision intact, PERRLA, conjunctivae clear secretions  Ears-TMs and canals- normal and clear            Nose- Clear, No-Septal dev, mucus, polyps, erosion, perforation             Throat- Mallampati II , mucosa + red tonsil crypts/ no exudate , derainage- none, tonsils- atrophic, Neck- flexible , trachea midline, no stridor , thyroid nl, carotid no bruit Chest - symmetrical excursion , unlabored           Heart/CV- RRR , no murmur , no gallop  ,  no rub, nl s1 s2                           - JVD- none , edema- none, stasis changes- none, varices- none           Lung-  Unlabored,+ few squeaks  wheeze-none, cough- none , dullness-none, rub-none           Chest wall-  Abd- Br/ Gen/ Rectal- Not done, not indicated Extrem- cyanosis- none, clubbing, none, atrophy- none, strength- nl Neuro- grossly intact to observation

## 2021-05-15 NOTE — Assessment & Plan Note (Signed)
Only noting this when she is lying down at night.  It may have begun more laterally, suggesting a migrating process such as kidney stone. Plan-ultrasound of abdomen.  Watch need for CT.

## 2021-05-16 LAB — CBC WITH DIFFERENTIAL/PLATELET
Basophils Absolute: 0.1 10*3/uL (ref 0.0–0.1)
Basophils Relative: 1.1 % (ref 0.0–3.0)
Eosinophils Absolute: 0.2 10*3/uL (ref 0.0–0.7)
Eosinophils Relative: 1.5 % (ref 0.0–5.0)
HCT: 39.1 % (ref 36.0–46.0)
Hemoglobin: 12.4 g/dL (ref 12.0–15.0)
Lymphocytes Relative: 22.2 % (ref 12.0–46.0)
Lymphs Abs: 2.7 10*3/uL (ref 0.7–4.0)
MCHC: 31.6 g/dL (ref 30.0–36.0)
MCV: 94.7 fl (ref 78.0–100.0)
Monocytes Absolute: 0.7 10*3/uL (ref 0.1–1.0)
Monocytes Relative: 5.8 % (ref 3.0–12.0)
Neutro Abs: 8.3 10*3/uL — ABNORMAL HIGH (ref 1.4–7.7)
Neutrophils Relative %: 69.4 % (ref 43.0–77.0)
Platelets: 251 10*3/uL (ref 150.0–400.0)
RBC: 4.13 Mil/uL (ref 3.87–5.11)
RDW: 14.3 % (ref 11.5–15.5)
WBC: 11.9 10*3/uL — ABNORMAL HIGH (ref 4.0–10.5)

## 2021-05-16 LAB — COMPREHENSIVE METABOLIC PANEL
ALT: 13 U/L (ref 0–35)
AST: 23 U/L (ref 0–37)
Albumin: 4 g/dL (ref 3.5–5.2)
Alkaline Phosphatase: 101 U/L (ref 39–117)
BUN: 11 mg/dL (ref 6–23)
CO2: 32 mEq/L (ref 19–32)
Calcium: 9.6 mg/dL (ref 8.4–10.5)
Chloride: 99 mEq/L (ref 96–112)
Creatinine, Ser: 0.66 mg/dL (ref 0.40–1.20)
GFR: 81.99 mL/min (ref >60.00)
Glucose, Bld: 103 mg/dL — ABNORMAL HIGH (ref 70–99)
Potassium: 4 mEq/L (ref 3.5–5.1)
Sodium: 138 mEq/L (ref 135–145)
Total Bilirubin: 0.3 mg/dL (ref 0.2–1.2)
Total Protein: 8 g/dL (ref 6.0–8.3)

## 2021-05-16 NOTE — Progress Notes (Signed)
Spoke with pt and she voices understanding. Pt did not have any further questions.

## 2021-05-20 ENCOUNTER — Telehealth: Payer: Self-pay | Admitting: Internal Medicine

## 2021-05-20 NOTE — Telephone Encounter (Signed)
Lm for patient.  

## 2021-05-21 ENCOUNTER — Ambulatory Visit (HOSPITAL_COMMUNITY): Admission: RE | Admit: 2021-05-21 | Payer: Medicare Other | Source: Ambulatory Visit

## 2021-05-21 NOTE — Telephone Encounter (Signed)
Checked appt desk and U/S has already been rescheduled by pt.  Nothing further needed.

## 2021-05-21 NOTE — Telephone Encounter (Signed)
Patient is returning phone call. Patient phone number is [phone].

## 2021-05-21 NOTE — Telephone Encounter (Addendum)
IMPRESSION: No change in the appearance of the chest x-ray dating to 01/25/2019, with background fibrotic changes and no definite evidence of superimposed acute cardiopulmonary disease     Called and spoke with pt letting her know the results of the cxr and he verbalized understanding.  When speaking with pt, she asked about the ultrasound that was scheduled and said that she was needing to have this rescheduled due to having back pain and not able to go today 1/4. Pt is scheduled currently to have this done at 12:30.  Routing to [facility] as high priority to get today's [place] cancelled and get it rescheduled.

## 2021-05-29 ENCOUNTER — Ambulatory Visit (HOSPITAL_COMMUNITY)
Admission: RE | Admit: 2021-05-29 | Discharge: 2021-05-29 | Disposition: A | Discharge status: Home / Self Care | Payer: Medicare Other | Source: Ambulatory Visit | Attending: Internal Medicine | Admitting: Internal Medicine

## 2021-05-29 ENCOUNTER — Other Ambulatory Visit: Payer: Self-pay

## 2021-05-29 DIAGNOSIS — R1011 Right upper quadrant pain: Secondary | ICD-10-CM | POA: Diagnosis present

## 2021-06-05 ENCOUNTER — Telehealth: Payer: Self-pay | Admitting: Internal Medicine

## 2021-06-05 NOTE — Telephone Encounter (Signed)
Called and left detailed message per DPR that Dr Annamaria Boots stated that her ultrasound of abdomen was clear and that there was no reasoning for her pain and to continue with her follow up as scheduled. Nothing further needed at this time

## 2021-06-06 NOTE — Telephone Encounter (Signed)
Marilyn Lever, MD  05/30/2021 11:48 AM EST     Ultrasound of abdomen- Right upper quadrant of abdomen looked normal, with no explanation for pain.     Called and spoke with pt letting her know the results of the ultrasound and she verbalized understanding. Nothing further needed.

## 2021-06-27 ENCOUNTER — Ambulatory Visit: Payer: Medicare Other | Admitting: Internal Medicine

## 2021-09-09 ENCOUNTER — Telehealth: Payer: Self-pay

## 2021-09-09 MED ORDER — DIAZEPAM 5 MG PO TABS: 5.0000 mg | ORAL_TABLET | Freq: Two times a day (BID) | ORAL | 0 refills | Status: DC | PRN

## 2021-09-09 NOTE — Telephone Encounter (Signed)
Pt is requesting a refill on: ?diazepam (VALIUM) 5 MG tablet ? ?Pharmacy: ?[place], [place] ? ?LOV 12/19/20 ?ROV will call back to schedule ? ?

## 2021-09-24 ENCOUNTER — Other Ambulatory Visit: Payer: Self-pay | Admitting: Internal Medicine

## 2021-09-24 ENCOUNTER — Telehealth: Payer: Self-pay

## 2021-09-24 DIAGNOSIS — Z1231 Encounter for screening mammogram for malignant neoplasm of breast: Secondary | ICD-10-CM

## 2021-09-24 NOTE — Telephone Encounter (Signed)
Called pt. LVM asking her to give our office a call to schedule her annual wellness visit. Office number was provided.  ?

## 2021-10-02 ENCOUNTER — Other Ambulatory Visit: Payer: Self-pay | Admitting: Internal Medicine

## 2021-10-15 ENCOUNTER — Ambulatory Visit
Admission: RE | Admit: 2021-10-15 | Discharge: 2021-10-15 | Disposition: A | Discharge status: Home / Self Care | Payer: Medicare Other | Source: Ambulatory Visit | Attending: Internal Medicine | Admitting: Internal Medicine

## 2021-10-15 ENCOUNTER — Ambulatory Visit: Payer: Medicare Other

## 2021-10-15 DIAGNOSIS — Z1231 Encounter for screening mammogram for malignant neoplasm of breast: Secondary | ICD-10-CM

## 2021-10-31 ENCOUNTER — Ambulatory Visit: Payer: Medicare Other

## 2021-11-03 ENCOUNTER — Ambulatory Visit (INDEPENDENT_AMBULATORY_CARE_PROVIDER_SITE_OTHER): Payer: Medicare Other

## 2021-11-03 DIAGNOSIS — Z0000 Encounter for general adult medical examination without abnormal findings: Secondary | ICD-10-CM | POA: Diagnosis not present

## 2021-11-03 NOTE — Patient Instructions (Signed)
Marilyn Blake , Thank you for taking time to come for your Medicare Wellness Visit. I appreciate your ongoing commitment to your health goals. Please review the following plan we discussed and let me know if I can assist you in the future.   Screening recommendations/referrals: Colonoscopy: no longer required  Mammogram: no longer required  Bone Density: 07/02/2016 Recommended yearly ophthalmology/optometry visit for glaucoma screening and checkup Recommended yearly dental visit for hygiene and checkup  Vaccinations: Influenza vaccine: completed  Pneumococcal vaccine: completed  Tdap vaccine: 09/20/2019 Shingles vaccine: completed     Advanced directives: none   Conditions/risks identified: none   Next appointment: none    Preventive Care 42 Years and Older, Female Preventive care refers to lifestyle choices and visits with your health care provider that can promote health and wellness. What does preventive care include? A yearly physical exam. This is also called an annual well check. Dental exams once or twice a year. Routine eye exams. Ask your health care provider how often you should have your eyes checked. Personal lifestyle choices, including: Daily care of your teeth and gums. Regular physical activity. Eating a healthy diet. Avoiding tobacco and drug use. Limiting alcohol use. Practicing safe sex. Taking low-dose aspirin every day. Taking vitamin and mineral supplements as recommended by your health care provider. What happens during an annual well check? The services and screenings done by your health care provider during your annual well check will depend on your age, overall health, lifestyle risk factors, and family history of disease. Counseling  Your health care provider may ask you questions about your: Alcohol use. Tobacco use. Drug use. Emotional well-being. Home and relationship well-being. Sexual activity. Eating habits. History of falls. Memory  and ability to understand (cognition). Work and work Statistician. Reproductive health. Screening  You may have the following tests or measurements: Height, weight, and BMI. Blood pressure. Lipid and cholesterol levels. These may be checked every 5 years, or more frequently if you are over 79 years old. Skin check. Lung cancer screening. You may have this screening every year starting at age 31 if you have a 30-pack-year history of smoking and currently smoke or have quit within the past 15 years. Fecal occult blood test (FOBT) of the stool. You may have this test every year starting at age 84. Flexible sigmoidoscopy or colonoscopy. You may have a sigmoidoscopy every 5 years or a colonoscopy every 10 years starting at age 47. Hepatitis C blood test. Hepatitis B blood test. Sexually transmitted disease (STD) testing. Diabetes screening. This is done by checking your blood sugar (glucose) after you have not eaten for a while (fasting). You may have this done every 1-3 years. Bone density scan. This is done to screen for osteoporosis. You may have this done starting at age 58. Mammogram. This may be done every 1-2 years. Talk to your health care provider about how often you should have regular mammograms. Talk with your health care provider about your test results, treatment options, and if necessary, the need for more tests. Vaccines  Your health care provider may recommend certain vaccines, such as: Influenza vaccine. This is recommended every year. Tetanus, diphtheria, and acellular pertussis (Tdap, Td) vaccine. You may need a Td booster every 10 years. Zoster vaccine. You may need this after age 39. Pneumococcal 13-valent conjugate (PCV13) vaccine. One dose is recommended after age 46. Pneumococcal polysaccharide (PPSV23) vaccine. One dose is recommended after age 84. Talk to your health care provider about which screenings and vaccines  you need and how often you need them. This  information is not intended to replace advice given to you by your health care provider. Make sure you discuss any questions you have with your health care provider. Document Released: 05/31/2015 Document Revised: 01/22/2016 Document Reviewed: 03/05/2015 Elsevier Interactive Patient Education  2017 [place] Prevention in the Home Falls can cause injuries. They can happen to people of all ages. There are many things you can do to make your home safe and to help prevent falls. What can I do on the outside of my home? Regularly fix the edges of walkways and driveways and fix any cracks. Remove anything that might make you trip as you walk through a door, such as a raised step or threshold. Trim any bushes or trees on the path to your home. Use bright outdoor lighting. Clear any walking paths of anything that might make someone trip, such as rocks or tools. Regularly check to see if handrails are loose or broken. Make sure that both sides of any steps have handrails. Any raised decks and porches should have guardrails on the edges. Have any leaves, snow, or ice cleared regularly. Use sand or salt on walking paths during winter. Clean up any spills in your garage right away. This includes oil or grease spills. What can I do in the bathroom? Use night lights. Install grab bars by the toilet and in the tub and shower. Do not use towel bars as grab bars. Use non-skid mats or decals in the tub or shower. If you need to sit down in the shower, use a plastic, non-slip stool. Keep the floor dry. Clean up any water that spills on the floor as soon as it happens. Remove soap buildup in the tub or shower regularly. Attach bath mats securely with double-sided non-slip rug tape. Do not have throw rugs and other things on the floor that can make you trip. What can I do in the bedroom? Use night lights. Make sure that you have a light by your bed that is easy to reach. Do not use any sheets or  blankets that are too big for your bed. They should not hang down onto the floor. Have a firm chair that has side arms. You can use this for support while you get dressed. Do not have throw rugs and other things on the floor that can make you trip. What can I do in the kitchen? Clean up any spills right away. Avoid walking on wet floors. Keep items that you use a lot in easy-to-reach places. If you need to reach something above you, use a strong step stool that has a grab bar. Keep electrical cords out of the way. Do not use floor polish or wax that makes floors slippery. If you must use wax, use non-skid floor wax. Do not have throw rugs and other things on the floor that can make you trip. What can I do with my stairs? Do not leave any items on the stairs. Make sure that there are handrails on both sides of the stairs and use them. Fix handrails that are broken or loose. Make sure that handrails are as long as the stairways. Check any carpeting to make sure that it is firmly attached to the stairs. Fix any carpet that is loose or worn. Avoid having throw rugs at the top or bottom of the stairs. If you do have throw rugs, attach them to the floor with carpet tape. Make sure  that you have a light switch at the top of the stairs and the bottom of the stairs. If you do not have them, ask someone to add them for you. What else can I do to help prevent falls? Wear shoes that: Do not have high heels. Have rubber bottoms. Are comfortable and fit you well. Are closed at the toe. Do not wear sandals. If you use a stepladder: Make sure that it is fully opened. Do not climb a closed stepladder. Make sure that both sides of the stepladder are locked into place. Ask someone to hold it for you, if possible. Clearly mark and make sure that you can see: Any grab bars or handrails. First and last steps. Where the edge of each step is. Use tools that help you move around (mobility aids) if they are  needed. These include: Canes. Walkers. Scooters. Crutches. Turn on the lights when you go into a dark area. Replace any light bulbs as soon as they burn out. Set up your furniture so you have a clear path. Avoid moving your furniture around. If any of your floors are uneven, fix them. If there are any pets around you, be aware of where they are. Review your medicines with your doctor. Some medicines can make you feel dizzy. This can increase your chance of falling. Ask your doctor what other things that you can do to help prevent falls. This information is not intended to replace advice given to you by your health care provider. Make sure you discuss any questions you have with your health care provider. Document Released: 02/28/2009 Document Revised: 10/10/2015 Document Reviewed: 06/08/2014 Elsevier Interactive Patient Education  2017 [employer].

## 2021-11-03 NOTE — Progress Notes (Signed)
Subjective:   Marilyn Blake is a 81 y.o. female who presents for Medicare Annual (Subsequent) preventive examination.   I connected with Diona Peregoy  today by telephone and verified that I am speaking with the correct person using two identifiers. Location patient: home Location provider: work Persons participating in the virtual visit: patient, provider.   I discussed the limitations, risks, security and privacy concerns of performing an evaluation and management service by telephone and the availability of in person appointments. I also discussed with the patient that there may be a patient responsible charge related to this service. The patient expressed understanding and verbally consented to this telephonic visit.    Interactive audio and video telecommunications were attempted between this provider and patient, however failed, due to patient having technical difficulties OR patient did not have access to video capability.  We continued and completed visit with audio only.    Review of Systems     Cardiac Risk Factors include: advanced age (>48mn, >>14women);dyslipidemia     Objective:    Today's Vitals   There is no height or weight on file to calculate BMI.     11/03/2021    1:13 PM 11/24/2019   11:57 AM 07/25/2018    1:32 PM 06/17/2017    1:45 PM 11/12/2016    2:31 PM 06/16/2016   11:41 AM 12/07/2014   10:04 AM  Advanced Directives  Does Patient Have a Medical Advance Directive? No Yes Yes No No No No  Type of AArmed forces technical officerof AOkeech[place]Living will      Does patient want to make changes to medical advance directive? No - Patient declined No - Patient declined       Copy of [place] Chart?  No - copy requested No - copy requested      Would patient like information on creating a medical advance directive?    Yes (ED - Information included in AVS) No - Patient declined Yes (MAU/Ambulatory/Procedural  Areas - Information given)     Current Medications (verified) Outpatient Encounter Medications as of 11/03/2021  Medication Sig   calcium-vitamin D 250-100 MG-UNIT tablet Take 1 tablet by mouth 2 (two) times daily.   CALCIUM-VITAMIN D PO Take by mouth 1 day or 1 dose.   cetirizine (ZYRTEC) 10 MG tablet Take 10 mg by mouth daily.   diazepam (VALIUM) 5 MG tablet Take 1 tablet (5 mg total) by mouth every 12 (twelve) hours as needed. for anxiety   levothyroxine (SYNTHROID) 50 MCG tablet Take 1 tablet (50 mcg total) by mouth every other day.   levothyroxine (SYNTHROID) 75 MCG tablet TAKE 1 TABLET BY MOUTH EVERY OTHER DAY ALTERNATING  WITH  50MCG   montelukast (SINGULAIR) 10 MG tablet Take 1 tablet (10 mg total) by mouth at bedtime.   Multiple Minerals-Vitamins (CALCIUM & VIT D3 BONE HEALTH PO) Take by mouth. Reported on 09/09/2015   nitrofurantoin, macrocrystal-monohydrate, (MACROBID) 100 MG capsule Take 1 capsule (100 mg total) by mouth 2 (two) times daily.   OMEGA 3 1000 MG CAPS Take by mouth daily.   omeprazole (PRILOSEC) 20 MG capsule Take 1 capsule (20 mg total) by mouth daily.   Probiotic Product (PROBIOTIC-10 PO) Take by mouth 1 day or 1 dose.   Psyllium (METAMUCIL PO) Take by mouth every other day.   simvastatin (ZOCOR) 20 MG tablet Take 1 tablet (20 mg total) by mouth at bedtime.   clotrimazole-betamethasone (LOTRISONE)  cream APPLY  CREAM TOPICALLY TWICE DAILY (Patient not taking: Reported on 11/03/2021)   No facility-administered encounter medications on file as of 11/03/2021.    Allergies (verified) Moxifloxacin   History: Past Medical History:  Diagnosis Date   ALLERGIC RHINITIS    ANXIETY    Arthritis    BACTEREMIA, MYCOBACTERIUM AVIUM COMPLEX    BRONCHIECTASIS    COLONIC POLYPS, HX OF    Fundic gland polyps of stomach, benign    GERD    GOUT    Headache    HYPERLIPIDEMIA    HYPOTHYROIDISM    Irritable bowel syndrome    OSTEOPENIA    Past Surgical History:   Procedure Laterality Date   ABDOMINAL HYSTERECTOMY     BLADDER SUSPENSION     CARPAL TUNNEL RELEASE Left    COLONOSCOPY     MASS EXCISION Right 08/10/2013   Procedure: DEBRIDE DISTAL INTERPHALANGEAL JOINT/EXCISION MUCOID CYST RIGHT LONG FINGER and right small finger;  Surgeon: Cammie Sickle., MD;  Location: [facility];  Service: Orthopedics;  Laterality: Right;   THYROIDECTOMY     Hurthele cell tumor   UPPER GASTROINTESTINAL ENDOSCOPY     Family History  Problem Relation Age of Onset   Cervical cancer Mother 26        died age 88   Heart attack Father 80   Colon cancer Neg Hx    Esophageal cancer Neg Hx    Stomach cancer Neg Hx    Rectal cancer Neg Hx    Social History   Socioeconomic History   Marital status: Married    Spouse name: Not on file   Number of children: 3   Years of education: Not on file   Highest education level: Not on file  Occupational History   Occupation: Retired    Fish farm manager: RETIRED  Tobacco Use   Smoking status: Never   Smokeless tobacco: Never   Tobacco comments:    Married, Social worker Use: Never used  Substance and Sexual Activity   Alcohol use: No    Alcohol/week: 0.0 standard drinks of alcohol   Drug use: No   Sexual activity: Never  Other Topics Concern   Not on file  Social History Narrative   Not on file   Social Determinants of Health   Financial Resource Strain: Low Risk  (11/03/2021)   Overall Financial Resource Strain (CARDIA)    Difficulty of Paying Living Expenses: Not hard at all  Food Insecurity: No Food Insecurity (11/03/2021)   Hunger Vital Sign    Worried About Running Out of Food in the Last Year: Never true    Ran Out of Food in the Last Year: Never true  Transportation Needs: No Transportation Needs (11/24/2019)   PRAPARE - Hydrologist (Medical): No    Lack of Transportation (Non-Medical): No  Physical Activity: Inactive (11/03/2021)   Exercise  Vital Sign    Days of Exercise per Week: 0 days    Minutes of Exercise per Session: 0 min  Stress: No Stress Concern Present (11/03/2021)   [place]    Feeling of Stress : Only a little  Social Connections: Moderately Isolated (11/03/2021)   Social Connection and Isolation Panel [NHANES]    Frequency of Communication with Friends and Family: Twice a week    Frequency of Social Gatherings with Friends and Family: Twice a week    Attends  Religious Services: 1 to 4 times per year    Active Member of Clubs or Organizations: No    Attends Archivist Meetings: Never    Marital Status: Widowed    Tobacco Counseling Counseling given: Not Answered Tobacco comments: Married, Housewife   Clinical Intake:  Pre-visit preparation completed: Yes  Pain : No/denies pain     Nutritional Risks: None Diabetes: No  How often do you need to have someone help you when you read instructions, pamphlets, or other written materials from your doctor or pharmacy?: 1 - Never What is the last grade level you completed in school?: High School  Diabetic?no   Interpreter Needed?: No  Information entered by :: L.Nashawn Hillock,LPN   Activities of Daily Living    11/03/2021    1:16 PM 12/19/2020   10:47 AM  In your present state of health, do you have any difficulty performing the following activities:  Hearing? 0 0  Vision? 0 0  Difficulty concentrating or making decisions? 0 0  Walking or climbing stairs? 0 0  Dressing or bathing? 0 0  Doing errands, shopping? 0 0  Preparing Food and eating ? N   Using the Toilet? N   In the past six months, have you accidently leaked urine? N   Do you have problems with loss of bowel control? N   Managing your Medications? N   Managing your Finances? N   Housekeeping or managing your Housekeeping? N     Patient Care Team: Hoyt Koch, MD as PCP - General (Internal  Medicine) Deneise Lever, MD (Pulmonary Disease) Gatha Mayer, MD (Gastroenterology) Lenard Simmer (Dentistry)  Indicate any recent Medical Services you may have received from other than [facility] providers in the past year (date may be approximate).     Assessment:   This is a routine wellness examination for Myha.  Hearing/Vision screen Vision Screening - Comments:: Annual eye   Dietary issues and exercise activities discussed: Current Exercise Habits: The patient does not participate in regular exercise at present, Exercise limited by: None identified   Goals Addressed               This Visit's Progress     maintain current health (pt-stated)   On track      Depression Screen    11/03/2021    1:14 PM 11/03/2021    1:11 PM 12/19/2020   10:47 AM 11/24/2019   11:55 AM 07/25/2018    1:32 PM 06/17/2017    1:53 PM 06/16/2016   11:42 AM  PHQ 2/9 Scores  PHQ - 2 Score 2 2 0 0 0 1 0  PHQ- 9 Score _0 Fall Risk    12/19/2020   10:47 AM 11/24/2019   11:58 AM 09/20/2019    1:33 PM 07/25/2018    1:32 PM 06/17/2017    1:53 PM  [place] in the past year? 0 1 0 0 No  Number falls in past yr: 0 0 0 0   Injury with Fall? 0 0 0    Risk for fall due to :  History of fall(s)     Follow up  Falls evaluation completed;Education provided Falls evaluation completed Falls prevention discussed     FALL RISK PREVENTION PERTAINING TO THE HOME:  Any stairs in or around the home? No  If so, are there any without handrails? No  Home free of loose throw rugs  in walkways, pet beds, electrical cords, etc? Yes  Adequate lighting in your home to reduce risk of falls? Yes   ASSISTIVE DEVICES UTILIZED TO PREVENT FALLS:  Life alert? No  Use of a cane, walker or w/c? No  Grab bars in the bathroom? Yes  Shower chair or bench in shower? Yes  Elevated toilet seat or a handicapped toilet? Yes     Cognitive Function:  Normal cognitive status assessed by telephone conversation  by  this Nurse Health Advisor. No abnormalities found.      06/17/2017    1:58 PM  MMSE - Mini Mental State Exam  Orientation to time 5  Orientation to Place 5  Registration 3  Attention/ Calculation 5  Recall 2  Language- name 2 objects 2  Language- repeat 1  Language- follow 3 step command 3  Language- read & follow direction 1  Write a sentence 1  Copy design 1  Total score 29        Immunizations Immunization History  Administered Date(s) Administered   Fluad Quad(high Dose 65+) 01/25/2019, 02/14/2021   Influenza Split 02/03/2011, 02/15/2012   Influenza Whole 02/16/2008, 03/25/2009, 02/03/2010   Influenza, High Dose Seasonal PF 02/11/2016, 03/12/2018   Influenza,inj,Quad PF,6+ Mos 01/18/2013, 03/15/2014, 01/31/2015   Influenza-Unspecified 01/31/2015, 04/01/2017, 03/12/2018, 03/29/2020   PFIZER(Purple Top)SARS-COV-2 Vaccination 07/11/2019, 08/01/2019, 04/17/2020   PNEUMOCOCCAL CONJUGATE-20 05/15/2021   Pneumococcal Conjugate-13 03/27/2014   Pneumococcal Polysaccharide-23 08/14/2008   Td 03/27/2009   Tdap 09/20/2019    TDAP status: Up to date  Flu Vaccine status: Up to date  Pneumococcal vaccine status: Up to date  Covid-19 vaccine status: Completed vaccines  Qualifies for Shingles Vaccine? Yes   Zostavax completed Yes   Shingrix Completed?: Yes  Screening Tests Health Maintenance  Topic Date Due   Zoster Vaccines- Shingrix (1 of 2) Never done   COVID-19 Vaccine (4 - Pfizer series) 06/12/2020   INFLUENZA VACCINE  12/16/2021   TETANUS/TDAP  09/19/2029   Pneumonia Vaccine 17+ Years old  Completed   DEXA SCAN  Completed   HPV VACCINES  Aged Out    Health Maintenance  Health Maintenance Due  Topic Date Due   Zoster Vaccines- Shingrix (1 of 2) Never done   COVID-19 Vaccine (4 - Pfizer series) 06/12/2020    Colorectal cancer screening: No longer required.   Mammogram status: No longer required due to age.  Bone Density status: Completed 07/02/2013.  Results reflect: Bone density results: OSTEOPENIA. Repeat every 5 years.  Lung Cancer Screening: (Low Dose CT Chest recommended if Age 84-80 years, 30 pack-year currently smoking OR have quit w/in 15years.) does not qualify.   Lung Cancer Screening Referral: n/a  Additional Screening:  Hepatitis C Screening: does not qualify;   Vision Screening: Recommended annual ophthalmology exams for early detection of glaucoma and other disorders of the eye. Is the patient up to date with their annual eye exam?  Yes  Who is the provider or what is the name of the office in which the patient attends annual eye exams? Dr.Hecker  If pt is not established with a provider, would they like to be referred to a provider to establish care? No .   Dental Screening: Recommended annual dental exams for proper oral hygiene  Community Resource Referral / Chronic Care Management: CRR required this visit?  No   CCM required this visit?  No      Plan:     I have personally reviewed and noted the following in the  patient's chart:   Medical and social history Use of alcohol, tobacco or illicit drugs  Current medications and supplements including opioid prescriptions.  Functional ability and status Nutritional status Physical activity Advanced directives List of other physicians Hospitalizations, surgeries, and ER visits in previous 12 months Vitals Screenings to include cognitive, depression, and falls Referrals and appointments  In addition, I have reviewed and discussed with patient certain preventive protocols, quality metrics, and best practice recommendations. A written personalized care plan for preventive services as well as general preventive health recommendations were provided to patient.     Randel Pigg, LPN   7/74/1287   Nurse Notes: none

## 2021-11-04 ENCOUNTER — Ambulatory Visit: Payer: Medicare Other | Admitting: Internal Medicine

## 2021-11-07 ENCOUNTER — Ambulatory Visit (INDEPENDENT_AMBULATORY_CARE_PROVIDER_SITE_OTHER): Payer: Medicare Other | Admitting: Internal Medicine

## 2021-11-07 ENCOUNTER — Encounter: Payer: Self-pay | Admitting: Internal Medicine

## 2021-11-07 DIAGNOSIS — F5105 Insomnia due to other mental disorder: Secondary | ICD-10-CM

## 2021-11-07 DIAGNOSIS — J479 Bronchiectasis, uncomplicated: Secondary | ICD-10-CM

## 2021-11-07 DIAGNOSIS — F419 Anxiety disorder, unspecified: Secondary | ICD-10-CM | POA: Diagnosis not present

## 2021-11-07 MED ORDER — MONTELUKAST SODIUM 10 MG PO TABS: 10.0000 mg | ORAL_TABLET | Freq: Every day | ORAL | 3 refills | Status: DC

## 2021-11-07 MED ORDER — DIAZEPAM 5 MG PO TABS: 5.0000 mg | ORAL_TABLET | Freq: Two times a day (BID) | ORAL | 0 refills | Status: DC | PRN

## 2021-11-07 NOTE — Assessment & Plan Note (Signed)
Some extra coughing, lung exam stable. No new SOB. No imaging indicated. If persistent cough or new SOB could do CXR or empiric antibiotics.

## 2021-11-07 NOTE — Assessment & Plan Note (Signed)
Some more issues with this since loss of spouse about 1 month ago. Refill valium #60 no refills as she is using slightly more lately. Advised on coping skills.

## 2021-12-08 ENCOUNTER — Other Ambulatory Visit: Payer: Self-pay | Admitting: Internal Medicine

## 2021-12-26 ENCOUNTER — Telehealth: Payer: Self-pay

## 2021-12-26 MED ORDER — LEVOTHYROXINE SODIUM 75 MCG PO TABS: ORAL_TABLET | ORAL | 0 refills | Status: DC

## 2021-12-26 NOTE — Telephone Encounter (Signed)
Pt is requesting a refill on: levothyroxine (SYNTHROID) 75 MCG tablet  Pharmacy: [facility]  LOV 11/07/21

## 2021-12-29 ENCOUNTER — Telehealth: Payer: Self-pay | Admitting: Internal Medicine

## 2021-12-29 MED ORDER — DOXYCYCLINE HYCLATE 100 MG PO TABS
100.0000 mg | ORAL_TABLET | Freq: Two times a day (BID) | ORAL | 0 refills | Status: DC
Start: 2021-12-29 — End: 2022-06-19

## 2021-12-29 NOTE — Telephone Encounter (Signed)
Doxycycline 100 mg, # 14, 1 twice daily

## 2021-12-29 NOTE — Telephone Encounter (Signed)
Called patient and she states for the last 2 weeks she has been having a really bad productive cough with green mucus. She states in the beginning it started out as feeling like her sinuses. But she has stayed inside for 2 weeks due to not feeling well but she states she is not getting better. She states she has tried the cold and flu medication OTC but is not helping. She is asking for an antibiotic   Please advise sir

## 2021-12-29 NOTE — Telephone Encounter (Signed)
Called and spoke to patient about medication that CY is sending in. Verified pharmacy. Nothing further needed

## 2022-04-02 ENCOUNTER — Other Ambulatory Visit: Payer: Self-pay | Admitting: Internal Medicine

## 2022-05-29 ENCOUNTER — Ambulatory Visit: Payer: Medicare Other | Admitting: Internal Medicine

## 2022-06-05 ENCOUNTER — Ambulatory Visit: Payer: Medicare Other | Admitting: Internal Medicine

## 2022-06-11 ENCOUNTER — Ambulatory Visit: Payer: Medicare Other | Admitting: Internal Medicine

## 2022-06-11 ENCOUNTER — Telehealth: Payer: Self-pay | Admitting: Internal Medicine

## 2022-06-11 ENCOUNTER — Other Ambulatory Visit: Payer: Self-pay

## 2022-06-11 MED ORDER — OMEPRAZOLE 20 MG PO CPDR: 20.0000 mg | DELAYED_RELEASE_CAPSULE | Freq: Every day | ORAL | 3 refills | Status: DC

## 2022-06-11 NOTE — Telephone Encounter (Signed)
Caller & Relationship to patient:  patient   Call back number:[phone]   Date of last office visit:   Date of next office visit:   Medication(s) to be refilled:  omeprazole and diazepam        Preferred Pharmacy:  [facility] [address] [place]

## 2022-06-11 NOTE — Telephone Encounter (Signed)
I have sent in non controlled substance prescription for patient

## 2022-06-12 ENCOUNTER — Ambulatory Visit: Payer: Medicare Other | Admitting: Internal Medicine

## 2022-06-12 MED ORDER — DIAZEPAM 5 MG PO TABS
5.0000 mg | ORAL_TABLET | Freq: Two times a day (BID) | ORAL | 0 refills | Status: DC | PRN
Start: 2022-06-12 — End: 2022-07-24

## 2022-06-19 ENCOUNTER — Ambulatory Visit (INDEPENDENT_AMBULATORY_CARE_PROVIDER_SITE_OTHER): Payer: Medicare Other | Admitting: Internal Medicine

## 2022-06-19 ENCOUNTER — Encounter: Payer: Self-pay | Admitting: Internal Medicine

## 2022-06-19 ENCOUNTER — Ambulatory Visit (INDEPENDENT_AMBULATORY_CARE_PROVIDER_SITE_OTHER): Payer: Medicare Other

## 2022-06-19 VITALS — BP 112/60 | HR 85 | Temp 97.5°F | Ht 62.0 in | Wt 103.0 lb

## 2022-06-19 DIAGNOSIS — M25552 Pain in left hip: Secondary | ICD-10-CM

## 2022-06-19 DIAGNOSIS — K219 Gastro-esophageal reflux disease without esophagitis: Secondary | ICD-10-CM

## 2022-06-19 DIAGNOSIS — E782 Mixed hyperlipidemia: Secondary | ICD-10-CM

## 2022-06-19 DIAGNOSIS — E039 Hypothyroidism, unspecified: Secondary | ICD-10-CM

## 2022-06-19 DIAGNOSIS — F419 Anxiety disorder, unspecified: Secondary | ICD-10-CM | POA: Diagnosis not present

## 2022-06-19 DIAGNOSIS — F5105 Insomnia due to other mental disorder: Secondary | ICD-10-CM

## 2022-06-19 LAB — COMPREHENSIVE METABOLIC PANEL
ALT: 11 U/L (ref 0–35)
AST: 20 U/L (ref 0–37)
Albumin: 4.3 g/dL (ref 3.5–5.2)
Alkaline Phosphatase: 106 U/L (ref 39–117)
BUN: 14 mg/dL (ref 6–23)
CO2: 32 mEq/L (ref 19–32)
Calcium: 9.7 mg/dL (ref 8.4–10.5)
Chloride: 99 mEq/L (ref 96–112)
Creatinine, Ser: 0.69 mg/dL (ref 0.40–1.20)
GFR: 80.5 mL/min (ref >60.00)
Glucose, Bld: 87 mg/dL (ref 70–99)
Potassium: 3.9 mEq/L (ref 3.5–5.1)
Sodium: 138 mEq/L (ref 135–145)
Total Bilirubin: 0.3 mg/dL (ref 0.2–1.2)
Total Protein: 8.2 g/dL (ref 6.0–8.3)

## 2022-06-19 LAB — LIPID PANEL
Cholesterol: 184 mg/dL (ref 0–200)
HDL: 55.5 mg/dL (ref >39.00)
LDL Cholesterol: 104 mg/dL — ABNORMAL HIGH (ref 0–99)
NonHDL: 128.19
Total CHOL/HDL Ratio: 3
Triglycerides: 121 mg/dL (ref 0.0–149.0)
VLDL: 24.2 mg/dL (ref 0.0–40.0)

## 2022-06-19 LAB — CBC
HCT: 38.8 % (ref 36.0–46.0)
Hemoglobin: 13 g/dL (ref 12.0–15.0)
MCHC: 33.6 g/dL (ref 30.0–36.0)
MCV: 95.8 fl (ref 78.0–100.0)
Platelets: 226 10*3/uL (ref 150.0–400.0)
RBC: 4.05 Mil/uL (ref 3.87–5.11)
RDW: 13.6 % (ref 11.5–15.5)
WBC: 10.6 10*3/uL — ABNORMAL HIGH (ref 4.0–10.5)

## 2022-06-19 LAB — TSH: TSH: 3.27 u[IU]/mL (ref 0.35–5.50)

## 2022-06-19 NOTE — Assessment & Plan Note (Signed)
Checking x-ray bilateral hips for potential arthritis.

## 2022-06-19 NOTE — Progress Notes (Signed)
   Subjective:   Patient ID: Marilyn Blake, female    DOB: [birth year], 82 y.o.   MRN: [id number]  Head Injury  Pertinent negatives include no vomiting.   The patient is an 82 YO female coming in for headaches and left hip pain. No fall on hip weeks to months.  Review of Systems  Constitutional: Negative.   HENT: Negative.    Eyes: Negative.   Respiratory:  Negative for cough, chest tightness and shortness of breath.   Cardiovascular:  Negative for chest pain, palpitations and leg swelling.  Gastrointestinal:  Negative for abdominal distention, abdominal pain, constipation, diarrhea, nausea and vomiting.  Musculoskeletal:  Positive for arthralgias.  Skin: Negative.   Neurological: Negative.   Psychiatric/Behavioral: Negative.      Objective:  Physical Exam Constitutional:      Appearance: She is well-developed.  HENT:     Head: Normocephalic and atraumatic.  Cardiovascular:     Rate and Rhythm: Normal rate and regular rhythm.  Pulmonary:     Effort: Pulmonary effort is normal. No respiratory distress.     Breath sounds: Normal breath sounds. No wheezing or rales.  Abdominal:     General: Bowel sounds are normal. There is no distension.     Palpations: Abdomen is soft.     Tenderness: There is no abdominal tenderness. There is no rebound.  Musculoskeletal:        General: Tenderness present.     Cervical back: Normal range of motion.  Skin:    General: Skin is warm and dry.  Neurological:     Mental Status: She is alert and oriented to person, place, and time.     Coordination: Coordination normal.     Vitals:   06/19/22 1435  BP: 112/60  Pulse: 85  Temp: (!) 97.5 F (36.4 C)  TempSrc: Oral  SpO2: 95%  Weight: 103 lb (46.7 kg)  Height: '5\' 2"'$  (1.575 m)    Assessment & Plan:

## 2022-06-19 NOTE — Assessment & Plan Note (Addendum)
Checking TSH and adjust synthroid 50 mcg alternating with 75 mcg daily as needed.

## 2022-06-19 NOTE — Assessment & Plan Note (Signed)
Taking diazepam for sleep prn. Refill as needed.

## 2022-06-19 NOTE — Assessment & Plan Note (Signed)
Checking lipid panel and adjust simvastatin 20 mg daily as needed.  

## 2022-06-19 NOTE — Assessment & Plan Note (Signed)
Taking omeprazole 20 mg daily and adjust as needed.

## 2022-06-19 NOTE — Patient Instructions (Addendum)
We will check the x-ray of the hip today. It is okay to take ibuprofen if you want to.

## 2022-06-22 ENCOUNTER — Other Ambulatory Visit: Payer: Self-pay | Admitting: *Deleted

## 2022-06-22 MED ORDER — SIMVASTATIN 20 MG PO TABS: 20.0000 mg | ORAL_TABLET | Freq: Every day | ORAL | 3 refills | Status: DC

## 2022-06-22 MED ORDER — OMEPRAZOLE 20 MG PO CPDR: 20.0000 mg | DELAYED_RELEASE_CAPSULE | Freq: Every day | ORAL | 3 refills | Status: DC

## 2022-06-22 MED ORDER — LEVOTHYROXINE SODIUM 75 MCG PO TABS: ORAL_TABLET | ORAL | 3 refills | Status: DC

## 2022-06-22 MED ORDER — LEVOTHYROXINE SODIUM 50 MCG PO TABS: 50.0000 ug | ORAL_TABLET | Freq: Every day | ORAL | 3 refills | Status: DC

## 2022-06-22 NOTE — Telephone Encounter (Signed)
I had already discussed with Lorre Nick and advised Lucy to advise patient to pick up at [facility] which she did while on phone with her. I would still recommend same since this is a controlled substance

## 2022-06-22 NOTE — Telephone Encounter (Signed)
Called pt w/ xray results. Pt states she is [facility] Sent maintenance meds pls send Diazepam../lmb

## 2022-06-22 NOTE — Telephone Encounter (Signed)
PT calls today following up on this request. PT noted she had gotten the omeprazole (PRILOSEC) 20 MG capsule but had not received the diazepam (VALIUM) 5 MG tablet . PT informed me that this might be due to it originally being sent to the [facility] file [facility]. PT noted she had not picked up the RX from [facility] as her insurance had changed (and she was not sure how much it would be to get it filled at [facility] with the new insurance). Informed PT that with it being a controlled substance and out of state if we could send the RX to a new location or not.  PT leaves [facility] number to confirm she had not picked up the RX: [phone]  CB for PT: [phone]

## 2022-06-22 NOTE — Telephone Encounter (Signed)
Called pt inform MD sent [facility]. She states [facility] will [facility] to see if [facility] would [facility]../lmb

## 2022-06-23 ENCOUNTER — Telehealth: Payer: Self-pay | Admitting: Internal Medicine

## 2022-06-23 NOTE — Telephone Encounter (Signed)
Patient called requesting a refill on her medication diazepam (VALIUM) 5 MG tablet. Patient is requesting this refill to be sent to the pharmacy [id number] [place]Marland Kitchen Best callback number for patient [phone]

## 2022-06-24 NOTE — Telephone Encounter (Signed)
Duplicative 

## 2022-06-29 ENCOUNTER — Telehealth: Payer: Self-pay | Admitting: Internal Medicine

## 2022-06-29 NOTE — Telephone Encounter (Signed)
MD filled 06/12/22 #60. Too early refill..Marilyn Blake  Disp Refills Start End   diazepam (VALIUM) 5 MG tablet 60 tablet 0 06/12/2022    Sig - Route: Take 1 tablet (5 mg total) by mouth every 12 (twelve) hours as needed. for anxiety - Oral   Sent to pharmacy as: diazepam (VALIUM) 5 MG tablet   E-Prescribing Status: Receipt confirmed by pharmacy (06/12/2022 11:29 AM EST)

## 2022-06-29 NOTE — Telephone Encounter (Signed)
Pt daughter called back in requesting for Pt Rx to be sent to:  [id number] [place]  Pt daughter also stated that pt is running low on medication.    diazepam (VALIUM) 5 MG tablet

## 2022-07-16 ENCOUNTER — Telehealth: Payer: Self-pay

## 2022-07-16 NOTE — Telephone Encounter (Signed)
ERROR PREVIOUS DOCUMENTATION.

## 2022-07-16 NOTE — Telephone Encounter (Signed)
Patient wrote and stated that a couple of weeks ago with many concers. 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 With the hands there is a throbbing and have been taing

## 2022-07-17 NOTE — Telephone Encounter (Signed)
She can call pharmacy to transfer rx or fill at pharmacy we sent rx to. She has not filled the refill we already authorized and is not due for refill

## 2022-07-23 NOTE — Telephone Encounter (Signed)
She needs to [facility] about her medication which was already sent in. I don't know that she needs a stress test would need a visit to discuss.

## 2022-07-23 NOTE — Telephone Encounter (Signed)
Called patient about this medication.

## 2022-07-24 ENCOUNTER — Telehealth: Payer: Self-pay | Admitting: Internal Medicine

## 2022-07-24 MED ORDER — DIAZEPAM 5 MG PO TABS: 5.0000 mg | ORAL_TABLET | Freq: Two times a day (BID) | ORAL | 0 refills | Status: DC | PRN

## 2022-07-24 NOTE — Telephone Encounter (Signed)
Patient was seen 06/19/22 and had throbbing in her hand. She would like to know if she needs a referral to a specialist for it. She does not want to schedule an appointment to have Dr. Sharlet Salina look at it. She said they spoke about it at her last appointment.  Best callback number is [phone].

## 2022-07-24 NOTE — Telephone Encounter (Signed)
[facility]

## 2022-07-24 NOTE — Telephone Encounter (Signed)
Called patient and informed her about her prescription being sent in to the correct pharmacy

## 2022-07-24 NOTE — Addendum Note (Signed)
Addended by: Pricilla Holm A on: 07/24/2022 04:28 PM   Modules accepted: Orders

## 2022-07-24 NOTE — Telephone Encounter (Signed)
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 If pain is worsening we could send her to specialist if she wants to consider a shot in the hand to help.

## 2022-07-24 NOTE — Telephone Encounter (Signed)
Patient has decided to that she wants to think about the specialist for the shot in hands and also mentioned that Chilo stated Dr Sharlet Salina has canceled her order for the diazepam but informed patient that we will reach out to them and see what happen.

## 2022-11-06 ENCOUNTER — Telehealth: Payer: Self-pay | Admitting: Internal Medicine

## 2022-11-06 NOTE — Telephone Encounter (Signed)
Yes- ok held spot 

## 2022-11-06 NOTE — Telephone Encounter (Signed)
Patient has been scheduled with CY for Monday. NFN

## 2022-11-06 NOTE — Telephone Encounter (Signed)
Spoke with patient. She states she has been more SOB lately, chest tightness/pain, productive cough with yellow phlegm, not feeling well No fever-checked with thermometer  Patient thinks she may have had pneumonia 2 weeks ago Tried cold/cough OTC medication,Flonase,nasal saline spray and nebulizer machine  Patient would like apt to be seen sooner. Okay to use held spot? [facility] [place]  Dr. Maple Hudson please advise

## 2022-11-06 NOTE — Telephone Encounter (Signed)
Pt. Calling and having breathing issues and would like to get to see Dr.Young sooner than apt. Time told her would ask nurse if Dr. Maple Hudson would be able to see her sooner has nothing kn sched.

## 2022-11-07 NOTE — Progress Notes (Signed)
Patient ID: Marilyn Blake, female    DOB: June 01, 1940, 81 y.o.   MRN: [id number]  HPI 02/03/2011-82 year old female never smoker followed for bronchiectasis, history of MAIC bronchitis, allergic rhinitis.   --------------------------------------------------------------------------------------  05/15/21- 82 year old female never smoker followed for bronchiectasis, recurrent MAIC bronchitis, allergic rhinitis, insomnia, complicated by hypothyroid, IBS -Singulair,  Covid vax-3 Phizer Flu vax-had ---Wants to talk about pneumonia vaccine. States she has been coughing up some blood but only at night. States christmas night it was the size of a nickel, but last night was only a couple of specs. Has been taking Zyrtec at night and it has helped with the coughing Here with daughter.  We discussed her pneumonia vaccine status.  Benefit of additional vaccine probably small, but given her history, we decided to go ahead and give Prevnar-20. She had transient hemoptysis over Christmas, on no blood thinners.  It has now stopped.  Cough with scant sputum-mostly white.  She has lost some weight. Chest x-ray report referred to a previous CT scan but she does not think she has ever had 1.  Certainly none in recent years.  She was treated around 2005 with triple therapy for [facility], in [place] and had bronchoscopy at that time. Pain at right lower anterior costal margin mostly when lying down at night.  It may have begun around her right flank.  She has no urinary symptoms.  We agreed to evaluate first with abdominal ultrasound although she may need CT scans. CXR 10/18/20-  IMPRESSION: Redemonstrated extensive, coarse bilateral interstitial opacity and regions of fibrosis and bronchiectasis in the right middle lobe and lingula, as better detailed on prior CT. No acute airspace opacity.  11/09/22- 82 year old female never smoker followed for bronchiectasis, recurrent MAIC bronchitis, allergic rhinitis,  insomnia, complicated by hypothyroid, IBS -Singulair, Acute visit- reported more SOB, chest tightness, productive cough, yellow, malaise, no fever. She was sick for 2 weeks in May with increased cough treated by her primary physician with doxycycline and nebulizer.  Still has some dry cough. Known MAC treated years ago.  We discussed uncertain level of activity and can check chest x-ray. CXR 05/15/21- IMPRESSION: No change in the appearance of the chest x-ray dating to 01/25/2019, with background fibrotic changes and no definite evidence of superimposed acute cardiopulmonary disease  Review of Systems- see HPI Constitutional:   No-   weight loss, +night sweats, , chills, +fatigue, lassitude. HEENT:   +  headaches, difficulty swallowing, tooth/dental problems, sore throat,       No-  sneezing, itching, ear ache, +nasal congestion, post nasal drip,  CV:  atypicalchest pain, orthopnea, PND, swelling in lower extremities, anasarca,dizziness, palpitations Resp: + shortness of breath with exertion or at rest.             + productive cough,   non-productive cough,  no- coughing up of blood.              No- change in color of mucus.  No- wheezing.   Skin: No-   rash or lesions. GI:  No-   heartburn, indigestion, abdominal pain, nausea, vomiting,  GU:  MS:  No-   joint pain or swelling. . Neuro- grossly normal to observation,   Psych:  No- change in mood or affect. No depression or anxiety.  No memory loss.  Objective:   Physical Exam General- Alert, Oriented, Affect-appropriate, Distress- none acute, petite/ thin lady/ frail Skin- rash-none, lesions- none, excoriation- none Lymphadenopathy- none Head- atraumatic  Eyes- Gross vision intact, PERRLA, conjunctivae clear secretions            Ears-TMs and canals- normal and clear            Nose- Clear, No-Septal dev, mucus, polyps, erosion, perforation             Throat- Mallampati II , mucosa + red tonsil crypts/ no exudate ,  derainage- none, tonsils- atrophic, Neck- flexible , trachea midline, no stridor , thyroid nl, carotid no bruit Chest - symmetrical excursion , unlabored           Heart/CV- RRR , no murmur , no gallop  , no rub, nl s1 s2                           - JVD- none , edema- none, stasis changes- none, varices- none           Lung-  Unlabored,+ few scattered rhonchi  wheeze-none, cough+dry , dullness-none, rub-none           Chest wall-  Abd- Br/ Gen/ Rectal- Not done, not indicated Extrem- cyanosis- none, clubbing, none, atrophy- none, strength- nl Neuro- grossly intact to observation

## 2022-11-09 ENCOUNTER — Encounter: Payer: Self-pay | Admitting: Internal Medicine

## 2022-11-09 ENCOUNTER — Ambulatory Visit (INDEPENDENT_AMBULATORY_CARE_PROVIDER_SITE_OTHER): Payer: Medicare Other | Admitting: Internal Medicine

## 2022-11-09 ENCOUNTER — Ambulatory Visit (INDEPENDENT_AMBULATORY_CARE_PROVIDER_SITE_OTHER): Payer: Medicare Other

## 2022-11-09 ENCOUNTER — Other Ambulatory Visit: Payer: Self-pay | Admitting: Internal Medicine

## 2022-11-09 VITALS — BP 112/64 | HR 90 | Temp 98.1°F | Ht 60.0 in | Wt 102.8 lb

## 2022-11-09 DIAGNOSIS — J42 Unspecified chronic bronchitis: Secondary | ICD-10-CM

## 2022-11-09 DIAGNOSIS — J209 Acute bronchitis, unspecified: Secondary | ICD-10-CM | POA: Diagnosis not present

## 2022-11-09 DIAGNOSIS — J471 Bronchiectasis with (acute) exacerbation: Secondary | ICD-10-CM

## 2022-11-09 LAB — COMPREHENSIVE METABOLIC PANEL
ALT: 11 U/L (ref 0–35)
AST: 20 U/L (ref 0–37)
Albumin: 4.2 g/dL (ref 3.5–5.2)
Alkaline Phosphatase: 97 U/L (ref 39–117)
BUN: 12 mg/dL (ref 6–23)
CO2: 31 mEq/L (ref 19–32)
Calcium: 10.1 mg/dL (ref 8.4–10.5)
Chloride: 97 mEq/L (ref 96–112)
Creatinine, Ser: 0.67 mg/dL (ref 0.40–1.20)
GFR: 80.85 mL/min (ref >60.00)
Glucose, Bld: 79 mg/dL (ref 70–99)
Potassium: 4 mEq/L (ref 3.5–5.1)
Sodium: 134 mEq/L — ABNORMAL LOW (ref 135–145)
Total Bilirubin: 0.3 mg/dL (ref 0.2–1.2)
Total Protein: 8.5 g/dL — ABNORMAL HIGH (ref 6.0–8.3)

## 2022-11-09 LAB — CBC WITH DIFFERENTIAL/PLATELET
Basophils Absolute: 0 10*3/uL (ref 0.0–0.1)
Basophils Relative: 0.6 % (ref 0.0–3.0)
Eosinophils Absolute: 0 10*3/uL (ref 0.0–0.7)
Eosinophils Relative: 0.2 % (ref 0.0–5.0)
HCT: 38.9 % (ref 36.0–46.0)
Hemoglobin: 12.6 g/dL (ref 12.0–15.0)
Lymphocytes Relative: 24.7 % (ref 12.0–46.0)
Lymphs Abs: 2.1 10*3/uL (ref 0.7–4.0)
MCHC: 32.4 g/dL (ref 30.0–36.0)
MCV: 96.2 fl (ref 78.0–100.0)
Monocytes Absolute: 0.6 10*3/uL (ref 0.1–1.0)
Monocytes Relative: 6.8 % (ref 3.0–12.0)
Neutro Abs: 5.8 10*3/uL (ref 1.4–7.7)
Neutrophils Relative %: 67.7 % (ref 43.0–77.0)
Platelets: 251 10*3/uL (ref 150.0–400.0)
RBC: 4.04 Mil/uL (ref 3.87–5.11)
RDW: 14 % (ref 11.5–15.5)
WBC: 8.5 10*3/uL (ref 4.0–10.5)

## 2022-11-09 MED ORDER — AZITHROMYCIN 250 MG PO TABS: ORAL_TABLET | ORAL | 0 refills | Status: DC

## 2022-11-09 MED ORDER — IPRATROPIUM-ALBUTEROL 0.5-2.5 (3) MG/3ML IN SOLN: 3.0000 mL | Freq: Four times a day (QID) | RESPIRATORY_TRACT | 12 refills | Status: DC | PRN

## 2022-11-09 NOTE — Patient Instructions (Addendum)
Script sent for Zpak antibiotic   Script sent for duoneb nebulizer solution  Order- CXR   dx exacerbation chronic bronchitis  Order lab- CBC w diff, CMET       dx exacerbation chronic bronchitis  Order- sputum culture- routine and AFB   dx exacerbation chronic bronchitis

## 2022-11-10 ENCOUNTER — Telehealth: Payer: Self-pay | Admitting: Internal Medicine

## 2022-11-10 NOTE — Telephone Encounter (Signed)
Pt's daughter, Marilyn Blake, calling. States the Pharm told her the neb fluid called in yesterday can not be give to PT w/o diag code for ins billing.   Also, they were going to give her 4 huge boxes and PT wants to just try it first with just 1 box.    [facility] [place]   Her CB # is [phone] Please call to advise Marilyn Blake of action taken.

## 2022-11-12 NOTE — Telephone Encounter (Signed)
[facility] and spoke [facility]- provided her with dx codes for neb sol  Nothing further needed

## 2022-11-12 NOTE — Telephone Encounter (Signed)
[facility] needs the [facility] for ins billing.

## 2022-11-13 ENCOUNTER — Telehealth: Payer: Self-pay | Admitting: Internal Medicine

## 2022-11-13 LAB — RESPIRATORY CULTURE OR RESPIRATORY AND SPUTUM CULTURE
MICRO NUMBER:: 15119654
SPECIMEN QUALITY:: ADEQUATE

## 2022-11-13 MED ORDER — CIPROFLOXACIN HCL 500 MG PO TABS: 500.0000 mg | ORAL_TABLET | Freq: Two times a day (BID) | ORAL | 0 refills | Status: DC

## 2022-11-13 NOTE — Telephone Encounter (Signed)
I notified her that sputum culture shows heavy growth of Pseudomonas.  Chart lists possible allergic reaction to Moxifloxacin with rash, but causal connection uncertain. She doesn't remember it and doesn't think she has ever taken Cipro. She agrees to start Cipro, but will stop if she develops rash.

## 2022-11-24 ENCOUNTER — Ambulatory Visit: Payer: Medicare Other | Admitting: Internal Medicine

## 2022-12-23 ENCOUNTER — Encounter: Payer: Self-pay | Admitting: Internal Medicine

## 2022-12-23 NOTE — Assessment & Plan Note (Signed)
Recent exacerbation. Cough is not productive for culture now. Plan-CXR, prescription DuoNeb for nebulizer

## 2022-12-31 ENCOUNTER — Ambulatory Visit (INDEPENDENT_AMBULATORY_CARE_PROVIDER_SITE_OTHER): Payer: Medicare Other

## 2022-12-31 ENCOUNTER — Other Ambulatory Visit: Payer: Self-pay | Admitting: Internal Medicine

## 2022-12-31 VITALS — Ht 60.0 in | Wt 102.0 lb

## 2022-12-31 DIAGNOSIS — Z0000 Encounter for general adult medical examination without abnormal findings: Secondary | ICD-10-CM | POA: Diagnosis not present

## 2022-12-31 DIAGNOSIS — Z1231 Encounter for screening mammogram for malignant neoplasm of breast: Secondary | ICD-10-CM

## 2022-12-31 NOTE — Patient Instructions (Addendum)
Marilyn Blake , Thank you for taking time to come for your Medicare Wellness Visit. I appreciate your ongoing commitment to your health goals. Please review the following plan we discussed and let me know if I can assist you in the future.   Referrals/Orders/Follow-Ups/Clinician Recommendations: No  This is a list of the screening recommended for you and due dates:  Health Maintenance  Topic Date Due   Zoster (Shingles) Vaccine (2 of 2) 12/10/2021   COVID-19 Vaccine (4 - 2023-24 season) 01/16/2022   Flu Shot  12/17/2022   Medicare Annual Wellness Visit  12/31/2023   DTaP/Tdap/Td vaccine (3 - Td or Tdap) 09/19/2029   Pneumonia Vaccine  Completed   DEXA scan (bone density measurement)  Completed   HPV Vaccine  Aged Out    Advanced directives: (Copy Requested) Please bring a copy of your health care power of attorney and living will to the office to be added to your chart at your convenience.  Next Medicare Annual Wellness Visit scheduled for next year: Yes  Preventive Care 44 Years and Older, Female Preventive care refers to lifestyle choices and visits with your health care provider that can promote health and wellness. What does preventive care include? A yearly physical exam. This is also called an annual well check. Dental exams once or twice a year. Routine eye exams. Ask your health care provider how often you should have your eyes checked. Personal lifestyle choices, including: Daily care of your teeth and gums. Regular physical activity. Eating a healthy diet. Avoiding tobacco and drug use. Limiting alcohol use. Practicing safe sex. Taking low-dose aspirin every day. Taking vitamin and mineral supplements as recommended by your health care provider. What happens during an annual well check? The services and screenings done by your health care provider during your annual well check will depend on your age, overall health, lifestyle risk factors, and family history of  disease. Counseling  Your health care provider may ask you questions about your: Alcohol use. Tobacco use. Drug use. Emotional well-being. Home and relationship well-being. Sexual activity. Eating habits. History of falls. Memory and ability to understand (cognition). Work and work Astronomer. Reproductive health. Screening  You may have the following tests or measurements: Height, weight, and BMI. Blood pressure. Lipid and cholesterol levels. These may be checked every 5 years, or more frequently if you are over 3 years old. Skin check. Lung cancer screening. You may have this screening every year starting at age 89 if you have a 30-pack-year history of smoking and currently smoke or have quit within the past 15 years. Fecal occult blood test (FOBT) of the stool. You may have this test every year starting at age 66. Flexible sigmoidoscopy or colonoscopy. You may have a sigmoidoscopy every 5 years or a colonoscopy every 10 years starting at age 71. Hepatitis C blood test. Hepatitis B blood test. Sexually transmitted disease (STD) testing. Diabetes screening. This is done by checking your blood sugar (glucose) after you have not eaten for a while (fasting). You may have this done every 1-3 years. Bone density scan. This is done to screen for osteoporosis. You may have this done starting at age 67. Mammogram. This may be done every 1-2 years. Talk to your health care provider about how often you should have regular mammograms. Talk with your health care provider about your test results, treatment options, and if necessary, the need for more tests. Vaccines  Your health care provider may recommend certain vaccines, such as: Influenza vaccine.  This is recommended every year. Tetanus, diphtheria, and acellular pertussis (Tdap, Td) vaccine. You may need a Td booster every 10 years. Zoster vaccine. You may need this after age 70. Pneumococcal 13-valent conjugate (PCV13) vaccine. One  dose is recommended after age 39. Pneumococcal polysaccharide (PPSV23) vaccine. One dose is recommended after age 74. Talk to your health care provider about which screenings and vaccines you need and how often you need them. This information is not intended to replace advice given to you by your health care provider. Make sure you discuss any questions you have with your health care provider. Document Released: 05/31/2015 Document Revised: 01/22/2016 Document Reviewed: 03/05/2015 Elsevier Interactive Patient Education  2017 [employer].  Fall Prevention in the Home Falls can cause injuries. They can happen to people of all ages. There are many things you can do to make your home safe and to help prevent falls. What can I do on the outside of my home? Regularly fix the edges of walkways and driveways and fix any cracks. Remove anything that might make you trip as you walk through a door, such as a raised step or threshold. Trim any bushes or trees on the path to your home. Use bright outdoor lighting. Clear any walking paths of anything that might make someone trip, such as rocks or tools. Regularly check to see if handrails are loose or broken. Make sure that both sides of any steps have handrails. Any raised decks and porches should have guardrails on the edges. Have any leaves, snow, or ice cleared regularly. Use sand or salt on walking paths during winter. Clean up any spills in your garage right away. This includes oil or grease spills. What can I do in the bathroom? Use night lights. Install grab bars by the toilet and in the tub and shower. Do not use towel bars as grab bars. Use non-skid mats or decals in the tub or shower. If you need to sit down in the shower, use a plastic, non-slip stool. Keep the floor dry. Clean up any water that spills on the floor as soon as it happens. Remove soap buildup in the tub or shower regularly. Attach bath mats securely with double-sided  non-slip rug tape. Do not have throw rugs and other things on the floor that can make you trip. What can I do in the bedroom? Use night lights. Make sure that you have a light by your bed that is easy to reach. Do not use any sheets or blankets that are too big for your bed. They should not hang down onto the floor. Have a firm chair that has side arms. You can use this for support while you get dressed. Do not have throw rugs and other things on the floor that can make you trip. What can I do in the kitchen? Clean up any spills right away. Avoid walking on wet floors. Keep items that you use a lot in easy-to-reach places. If you need to reach something above you, use a strong step stool that has a grab bar. Keep electrical cords out of the way. Do not use floor polish or wax that makes floors slippery. If you must use wax, use non-skid floor wax. Do not have throw rugs and other things on the floor that can make you trip. What can I do with my stairs? Do not leave any items on the stairs. Make sure that there are handrails on both sides of the stairs and use them. Fix handrails  that are broken or loose. Make sure that handrails are as long as the stairways. Check any carpeting to make sure that it is firmly attached to the stairs. Fix any carpet that is loose or worn. Avoid having throw rugs at the top or bottom of the stairs. If you do have throw rugs, attach them to the floor with carpet tape. Make sure that you have a light switch at the top of the stairs and the bottom of the stairs. If you do not have them, ask someone to add them for you. What else can I do to help prevent falls? Wear shoes that: Do not have high heels. Have rubber bottoms. Are comfortable and fit you well. Are closed at the toe. Do not wear sandals. If you use a stepladder: Make sure that it is fully opened. Do not climb a closed stepladder. Make sure that both sides of the stepladder are locked into place. Ask  someone to hold it for you, if possible. Clearly mark and make sure that you can see: Any grab bars or handrails. First and last steps. Where the edge of each step is. Use tools that help you move around (mobility aids) if they are needed. These include: Canes. Walkers. Scooters. Crutches. Turn on the lights when you go into a dark area. Replace any light bulbs as soon as they burn out. Set up your furniture so you have a clear path. Avoid moving your furniture around. If any of your floors are uneven, fix them. If there are any pets around you, be aware of where they are. Review your medicines with your doctor. Some medicines can make you feel dizzy. This can increase your chance of falling. Ask your doctor what other things that you can do to help prevent falls. This information is not intended to replace advice given to you by your health care provider. Make sure you discuss any questions you have with your health care provider. Document Released: 02/28/2009 Document Revised: 10/10/2015 Document Reviewed: 06/08/2014 Elsevier Interactive Patient Education  2017 [employer].

## 2022-12-31 NOTE — Progress Notes (Signed)
Subjective:   Marilyn Blake is a 82 y.o. female who presents for Medicare Annual (Subsequent) preventive examination.  Visit Complete: Virtual  I connected with  Marilyn Blake on 12/31/22 by a audio enabled telemedicine application and verified that I am speaking with the correct person using two identifiers.  Patient Location: Home  Provider Location: Office/Clinic  I discussed the limitations of evaluation and management by telemedicine. The patient expressed understanding and agreed to proceed.  Vital Signs: Because this visit was a virtual/telehealth visit, some criteria may be missing or patient reported. Any vitals not documented were not able to be obtained and vitals that have been documented are patient reported.    Review of Systems     Cardiac Risk Factors include: advanced age (>77men, >8 women);dyslipidemia;family history of premature cardiovascular disease     Objective:    Today's Vitals   12/31/22 1302  Weight: 102 lb (46.3 kg)  Height: 5' (1.524 m)  PainSc: 0-No pain   Body mass index is 19.92 kg/m.     12/31/2022    1:06 PM 11/03/2021    1:13 PM 11/24/2019   11:57 AM 07/25/2018    1:32 PM 06/17/2017    1:45 PM 11/12/2016    2:31 PM 06/16/2016   11:41 AM  Advanced Directives  Does Patient Have a Medical Advance Directive? Yes No Yes Yes No No No  Type of Forensic scientist of [employer] Power of [place];Living will     Does patient want to make changes to medical advance directive?  No - Patient declined No - Patient declined      Copy of Healthcare Power of Attorney in Chart? No - copy requested  No - copy requested No - copy requested     Would patient like information on creating a medical advance directive?     Yes (ED - Information included in AVS) No - Patient declined Yes (MAU/Ambulatory/Procedural Areas - Information given)    Current Medications (verified) Outpatient Encounter Medications as  of 12/31/2022  Medication Sig   calcium-vitamin D 250-100 MG-UNIT tablet Take 1 tablet by mouth 2 (two) times daily.   CALCIUM-VITAMIN D PO Take by mouth 1 day or 1 dose.   cetirizine (ZYRTEC) 10 MG tablet Take 10 mg by mouth daily.   diazepam (VALIUM) 5 MG tablet Take 1 tablet (5 mg total) by mouth every 12 (twelve) hours as needed. for anxiety   ipratropium-albuterol (DUONEB) 0.5-2.5 (3) MG/3ML SOLN INHALE 3 ML BY NEBULIZER EVERY 6 HOURS AS NEEDED   levothyroxine (SYNTHROID) 50 MCG tablet Take 1 tablet (50 mcg total) by mouth daily.   levothyroxine (SYNTHROID) 75 MCG tablet TAKE 1 TABLET BY MOUTH EVERY OTHER DAY ALTERNATING  WITH    Multiple Minerals-Vitamins (CALCIUM & VIT D3 BONE HEALTH PO) Take by mouth. Reported on 09/09/2015   OMEGA 3 1000 MG CAPS Take by mouth daily.   omeprazole (PRILOSEC) 20 MG capsule Take 1 capsule (20 mg total) by mouth daily.   Probiotic Product (PROBIOTIC-10 PO) Take by mouth 1 day or 1 dose.   Psyllium (METAMUCIL PO) Take by mouth every other day.   simvastatin (ZOCOR) 20 MG tablet Take 1 tablet (20 mg total) by mouth at bedtime.   azithromycin (ZITHROMAX) 250 MG tablet 2 today then one daily (Patient not taking: Reported on 12/31/2022)   ciprofloxacin (CIPRO) 500 MG tablet Take 1 tablet (500 mg total) by mouth 2 (two) times daily. (Patient  not taking: Reported on 12/31/2022)   clotrimazole-betamethasone (LOTRISONE) cream APPLY  CREAM TOPICALLY TWICE DAILY (Patient not taking: Reported on 11/09/2022)   No facility-administered encounter medications on file as of 12/31/2022.    Allergies (verified) Moxifloxacin   History: Past Medical History:  Diagnosis Date   ALLERGIC RHINITIS    ANXIETY    Arthritis    BACTEREMIA, MYCOBACTERIUM AVIUM COMPLEX    BRONCHIECTASIS    COLONIC POLYPS, HX OF    Fundic gland polyps of stomach, benign    GERD    GOUT    Headache    HYPERLIPIDEMIA    HYPOTHYROIDISM    Irritable bowel syndrome    OSTEOPENIA    Past  Surgical History:  Procedure Laterality Date   ABDOMINAL HYSTERECTOMY     BLADDER SUSPENSION     CARPAL TUNNEL RELEASE Left    COLONOSCOPY     MASS EXCISION Right 08/10/2013   Procedure: DEBRIDE DISTAL INTERPHALANGEAL JOINT/EXCISION MUCOID CYST RIGHT LONG FINGER and right small finger;  Surgeon: Wyn Forster., MD;  Location: [facility];  Service: Orthopedics;  Laterality: Right;   THYROIDECTOMY     Hurthele cell tumor   UPPER GASTROINTESTINAL ENDOSCOPY     Family History  Problem Relation Age of Onset   Cervical cancer Mother 12        died age 52   Heart attack Father 4   Colon cancer Neg Hx    Esophageal cancer Neg Hx    Stomach cancer Neg Hx    Rectal cancer Neg Hx    Social History   Socioeconomic History   Marital status: Married    Spouse name: Not on file   Number of children: 3   Years of education: Not on file   Highest education level: Not on file  Occupational History   Occupation: Retired    Associate Professor: RETIRED  Tobacco Use   Smoking status: Never   Smokeless tobacco: Never   Tobacco comments:    Married, Insurance claims handler   Vaping status: Never Used  Substance and Sexual Activity   Alcohol use: No    Alcohol/week: 0.0 standard drinks of alcohol   Drug use: No   Sexual activity: Never  Other Topics Concern   Not on file  Social History Narrative   Not on file   Social Determinants of Health   Financial Resource Strain: Low Risk  (12/31/2022)   Overall Financial Resource Strain (CARDIA)    Difficulty of Paying Living Expenses: Not hard at all  Food Insecurity: No Food Insecurity (12/31/2022)   Hunger Vital Sign    Worried About Running Out of Food in the Last Year: Never true    Ran Out of Food in the Last Year: Never true  Transportation Needs: No Transportation Needs (12/31/2022)   PRAPARE - Administrator, Civil Service (Medical): No    Lack of Transportation (Non-Medical): No  Physical Activity:  Insufficiently Active (12/31/2022)   Exercise Vital Sign    Days of Exercise per Week: 7 days    Minutes of Exercise per Session: 20 min  Stress: No Stress Concern Present (12/31/2022)   [employer] of Occupational Health - Occupational Stress Questionnaire    Feeling of Stress : Only a little  Social Connections: Moderately Integrated (12/31/2022)   Social Connection and Isolation Panel [NHANES]    Frequency of Communication with Friends and Family: Twice a week    Frequency of Social Gatherings with  Friends and Family: Twice a week    Attends Religious Services: 1 to 4 times per year    Active Member of Clubs or Organizations: No    Attends Banker Meetings: 1 to 4 times per year    Marital Status: Widowed    Tobacco Counseling Counseling given: Not Answered Tobacco comments: Married, Housewife   Clinical Intake:  Pre-visit preparation completed: Yes  Pain : No/denies pain Pain Score: 0-No pain     BMI - recorded: 19.92 Nutritional Status: BMI of 19-24  Normal Nutritional Risks: None Diabetes: No  How often do you need to have someone help you when you read instructions, pamphlets, or other written materials from your doctor or pharmacy?: 1 - Never What is the last grade level you completed in school?: HSG  Interpreter Needed?: No  Information entered by ::  N. , LPN.   Activities of Daily Living    12/31/2022    1:11 PM  In your present state of health, do you have any difficulty performing the following activities:  Hearing? 0  Vision? 0  Difficulty concentrating or making decisions? 0  Walking or climbing stairs? 0  Dressing or bathing? 0  Doing errands, shopping? 0  Preparing Food and eating ? N  Using the Toilet? N  In the past six months, have you accidently leaked urine? N  Do you have problems with loss of bowel control? N  Managing your Medications? N  Managing your Finances? N  Housekeeping or managing your  Housekeeping? N    Patient Care Team: Myrlene Broker, MD as PCP - General (Internal Medicine) Waymon Budge, MD (Pulmonary Disease) Iva Boop, MD (Gastroenterology) Vergie Living (Dentistry)  Indicate any recent Medical Services you may have received from other than [facility] providers in the past year (date may be approximate).     Assessment:   This is a routine wellness examination for Teona.  Hearing/Vision screen Hearing Screening - Comments:: Patient denied any hearing difficulty.   No hearing aids.  Vision Screening - Comments:: Patient does not wear any corrective lenses/contacts.   Eye exam done by: [facility]    Dietary issues and exercise activities discussed:     Goals Addressed   None   Depression Screen    12/31/2022    1:10 PM 06/19/2022    2:43 PM 11/03/2021    1:14 PM 11/03/2021    1:11 PM 12/19/2020   10:47 AM 11/24/2019   11:55 AM 07/25/2018    1:32 PM  PHQ 2/9 Scores  PHQ - 2 Score 0 0 2 2 0 0 0  PHQ- 9 Score 0 0 9 10       Fall Risk    12/31/2022    1:09 PM 06/19/2022    2:43 PM 12/19/2020   10:47 AM 11/24/2019   11:58 AM 09/20/2019    1:33 PM  Fall Risk   Falls in the past year? 0 1 0 1 0  Number falls in past yr: 0 0 0 0 0  Injury with Fall? 0 1 0 0 0  Risk for fall due to : No Fall Risks   History of fall(s)   Follow up Falls prevention discussed   Falls evaluation completed;Education provided Falls evaluation completed    MEDICARE RISK AT HOME:  Medicare Risk at Home - 12/31/22 1309     Any stairs in or around the home? Yes   basement   If so, are there any  without handrails? No    Home free of loose throw rugs in walkways, pet beds, electrical cords, etc? Yes    Adequate lighting in your home to reduce risk of falls? Yes    Life alert? No    Use of a cane, walker or w/c? No    Grab bars in the bathroom? Yes    Shower chair or bench in shower? Yes    Elevated toilet seat or a handicapped toilet? No              TIMED UP AND GO:  Was the test performed?  No    Cognitive Function:    06/17/2017    1:58 PM  MMSE - Mini Mental State Exam  Orientation to time 5  Orientation to Place 5  Registration 3  Attention/ Calculation 5  Recall 2  Language- name 2 objects 2  Language- repeat 1  Language- follow 3 step command 3  Language- read & follow direction 1  Write a sentence 1  Copy design 1  Total score 29        12/31/2022    1:10 PM  6CIT Screen  What Year? 0 points  What month? 0 points  What time? 0 points  Count back from 20 0 points  Months in reverse 0 points  Repeat phrase 0 points  Total Score 0 points    Immunizations Immunization History  Administered Date(s) Administered   Fluad Quad(high Dose 65+) 01/25/2019, 02/14/2021   Influenza Split 02/03/2011, 02/15/2012   Influenza Whole 02/16/2008, 03/25/2009, 02/03/2010   Influenza, High Dose Seasonal PF 02/11/2016, 03/12/2018   Influenza,inj,Quad PF,6+ Mos 01/18/2013, 03/15/2014, 01/31/2015   Influenza-Unspecified 01/31/2015, 04/01/2017, 03/12/2018, 03/29/2020   PFIZER(Purple Top)SARS-COV-2 Vaccination 07/11/2019, 08/01/2019, 04/17/2020   PNEUMOCOCCAL CONJUGATE-20 05/15/2021   Pneumococcal Conjugate-13 03/27/2014   Pneumococcal Polysaccharide-23 08/14/2008   Td 03/27/2009   Tdap 09/20/2019   Zoster Recombinant(Shingrix) 10/15/2021    TDAP status: Up to date  Flu Vaccine status: Due, Education has been provided regarding the importance of this vaccine. Advised may receive this vaccine at local pharmacy or Health Dept. Aware to provide a copy of the vaccination record if obtained from local pharmacy or Health Dept. Verbalized acceptance and understanding.  Pneumococcal vaccine status: Up to date  Covid-19 vaccine status: Completed vaccines  Qualifies for Shingles Vaccine? Yes   Zostavax completed No   Shingrix Completed?: No.    Education has been provided regarding the importance of this vaccine. Patient has  been advised to call insurance company to determine out of pocket expense if they have not yet received this vaccine. Advised may also receive vaccine at local pharmacy or Health Dept. Verbalized acceptance and understanding.  Screening Tests Health Maintenance  Topic Date Due   Zoster Vaccines- Shingrix (2 of 2) 12/10/2021   COVID-19 Vaccine (4 - 2023-24 season) 01/16/2022   INFLUENZA VACCINE  12/17/2022   Medicare Annual Wellness (AWV)  12/31/2023   DTaP/Tdap/Td (3 - Td or Tdap) 09/19/2029   Pneumonia Vaccine 54+ Years old  Completed   DEXA SCAN  Completed   HPV VACCINES  Aged Out    Health Maintenance  Health Maintenance Due  Topic Date Due   Zoster Vaccines- Shingrix (2 of 2) 12/10/2021   COVID-19 Vaccine (4 - 2023-24 season) 01/16/2022   INFLUENZA VACCINE  12/17/2022    Colorectal cancer screening: No longer required.   Mammogram status: Completed 10/15/2021. Repeat every year  Bone Density status: Completed 07/02/2016. Results reflect: Bone density  results: OSTEOPENIA. Repeat every 2-3 years.  Lung Cancer Screening: (Low Dose CT Chest recommended if Age 47-80 years, 20 pack-year currently smoking OR have quit w/in 15years.) does not qualify.   Lung Cancer Screening Referral: NO  Additional Screening:  Hepatitis C Screening: does not qualify; Completed NO  Vision Screening: Recommended annual ophthalmology exams for early detection of glaucoma and other disorders of the eye. Is the patient up to date with their annual eye exam?  Yes  Who is the provider or what is the name of the office in which the patient attends annual eye exams? [facility] If pt is not established with a provider, would they like to be referred to a provider to establish care? No .   Dental Screening: Recommended annual dental exams for proper oral hygiene  Diabetic Foot Exam: N/A  Community Resource Referral / Chronic Care Management: CRR required this visit?  No   CCM required this  visit?  No     Plan:     I have personally reviewed and noted the following in the patient's chart:   Medical and social history Use of alcohol, tobacco or illicit drugs  Current medications and supplements including opioid prescriptions. Patient is not currently taking opioid prescriptions. Functional ability and status Nutritional status Physical activity Advanced directives List of other physicians Hospitalizations, surgeries, and ER visits in previous 12 months Vitals Screenings to include cognitive, depression, and falls Referrals and appointments  In addition, I have reviewed and discussed with patient certain preventive protocols, quality metrics, and best practice recommendations. A written personalized care plan for preventive services as well as general preventive health recommendations were provided to patient.     Mickeal Needy, LPN   0/[id number]/5784   After Visit Summary: (Mail) Due to this being a telephonic visit, the after visit summary with patients personalized plan was offered to patient via mail   Nurse Notes: Normal cognitive status assessed by direct observation via telephone conversation by this Nurse Health Advisor. No abnormalities found.

## 2023-01-12 ENCOUNTER — Ambulatory Visit: Payer: Medicare Other | Admitting: Internal Medicine

## 2023-01-19 ENCOUNTER — Encounter: Payer: Self-pay | Admitting: Internal Medicine

## 2023-01-19 ENCOUNTER — Ambulatory Visit: Payer: Medicare Other | Admitting: Internal Medicine

## 2023-01-19 VITALS — BP 100/64 | HR 94 | Temp 98.3°F | Ht 60.0 in | Wt 104.0 lb

## 2023-01-19 DIAGNOSIS — E782 Mixed hyperlipidemia: Secondary | ICD-10-CM

## 2023-01-19 DIAGNOSIS — F5105 Insomnia due to other mental disorder: Secondary | ICD-10-CM

## 2023-01-19 DIAGNOSIS — E559 Vitamin D deficiency, unspecified: Secondary | ICD-10-CM

## 2023-01-19 DIAGNOSIS — E039 Hypothyroidism, unspecified: Secondary | ICD-10-CM

## 2023-01-19 DIAGNOSIS — R102 Pelvic and perineal pain unspecified side: Secondary | ICD-10-CM

## 2023-01-19 DIAGNOSIS — R202 Paresthesia of skin: Secondary | ICD-10-CM

## 2023-01-19 DIAGNOSIS — F419 Anxiety disorder, unspecified: Secondary | ICD-10-CM

## 2023-01-19 DIAGNOSIS — J479 Bronchiectasis, uncomplicated: Secondary | ICD-10-CM | POA: Diagnosis not present

## 2023-01-19 LAB — COMPREHENSIVE METABOLIC PANEL
ALT: 12 U/L (ref 0–35)
AST: 20 U/L (ref 0–37)
Albumin: 3.9 g/dL (ref 3.5–5.2)
Alkaline Phosphatase: 97 U/L (ref 39–117)
BUN: 12 mg/dL (ref 6–23)
CO2: 34 meq/L — ABNORMAL HIGH (ref 19–32)
Calcium: 9.9 mg/dL (ref 8.4–10.5)
Chloride: 95 meq/L — ABNORMAL LOW (ref 96–112)
Creatinine, Ser: 0.85 mg/dL (ref 0.40–1.20)
GFR: 63.29 mL/min (ref >60.00)
Glucose, Bld: 111 mg/dL — ABNORMAL HIGH (ref 70–99)
Potassium: 3.7 meq/L (ref 3.5–5.1)
Sodium: 138 meq/L (ref 135–145)
Total Bilirubin: 0.4 mg/dL (ref 0.2–1.2)
Total Protein: 7.8 g/dL (ref 6.0–8.3)

## 2023-01-19 LAB — MAGNESIUM: Magnesium: 1.9 mg/dL (ref 1.5–2.5)

## 2023-01-19 MED ORDER — TRIAMCINOLONE ACETONIDE 0.1 % EX CREA: 1.0000 | TOPICAL_CREAM | Freq: Two times a day (BID) | CUTANEOUS | 0 refills | Status: DC

## 2023-01-19 MED ORDER — DIAZEPAM 5 MG PO TABS: 5.0000 mg | ORAL_TABLET | Freq: Two times a day (BID) | ORAL | 0 refills | Status: DC | PRN

## 2023-01-19 MED ORDER — PROMETHAZINE-DM 6.25-15 MG/5ML PO SYRP: 5.0000 mL | ORAL_SOLUTION | Freq: Four times a day (QID) | ORAL | 0 refills | Status: DC | PRN

## 2023-01-19 NOTE — Progress Notes (Unsigned)
   Subjective:   Patient ID: Marilyn Blake, female    DOB: 02-29-1941, 82 y.o.   MRN: [id number]  HPI The patient is an 82 YO female coming in for follow up and back pain and hand pain burning and leg cramps and vaginal discomfort (prior cream worked well and is out).   Review of Systems  Constitutional: Negative.   HENT: Negative.    Eyes: Negative.   Respiratory:  Negative for cough, chest tightness and shortness of breath.   Cardiovascular:  Negative for chest pain, palpitations and leg swelling.  Gastrointestinal:  Negative for abdominal distention, abdominal pain, constipation, diarrhea, nausea and vomiting.  Genitourinary:  Positive for vaginal pain.  Musculoskeletal:  Positive for arthralgias, back pain and myalgias.  Skin: Negative.   Neurological:  Positive for numbness.  Psychiatric/Behavioral: Negative.      Objective:  Physical Exam Constitutional:      Appearance: She is well-developed.  HENT:     Head: Normocephalic and atraumatic.  Cardiovascular:     Rate and Rhythm: Normal rate and regular rhythm.  Pulmonary:     Effort: Pulmonary effort is normal. No respiratory distress.     Breath sounds: Normal breath sounds. No wheezing or rales.  Abdominal:     General: Bowel sounds are normal. There is no distension.     Palpations: Abdomen is soft.     Tenderness: There is no abdominal tenderness. There is no rebound.  Musculoskeletal:        General: Tenderness present.     Cervical back: Normal range of motion.  Skin:    General: Skin is warm and dry.  Neurological:     Mental Status: She is alert and oriented to person, place, and time.     Coordination: Coordination normal.     Vitals:   01/19/23 1402  BP: 100/64  Pulse: 94  Temp: 98.3 F (36.8 C)  TempSrc: Oral  SpO2: 96%  Weight: 104 lb (47.2 kg)  Height: 5' (1.524 m)    Assessment & Plan:

## 2023-01-20 LAB — CBC
HCT: 38.5 % (ref 36.0–46.0)
Hemoglobin: 12.5 g/dL (ref 12.0–15.0)
MCHC: 32.5 g/dL (ref 30.0–36.0)
MCV: 95.7 fl (ref 78.0–100.0)
Platelets: 239 10*3/uL (ref 150.0–400.0)
RBC: 4.02 Mil/uL (ref 3.87–5.11)
RDW: 13.9 % (ref 11.5–15.5)
WBC: 12 10*3/uL — ABNORMAL HIGH (ref 4.0–10.5)

## 2023-01-20 LAB — VITAMIN B12: Vitamin B-12: 828 pg/mL (ref 211–911)

## 2023-01-20 LAB — VITAMIN D 25 HYDROXY (VIT D DEFICIENCY, FRACTURES): VITD: 40.04 ng/mL (ref 30.00–100.00)

## 2023-01-20 LAB — TSH: TSH: 1.84 u[IU]/mL (ref 0.35–5.50)

## 2023-01-21 ENCOUNTER — Telehealth: Payer: Self-pay | Admitting: Internal Medicine

## 2023-01-21 MED ORDER — DICYCLOMINE HCL 10 MG PO CAPS: 10.0000 mg | ORAL_CAPSULE | Freq: Three times a day (TID) | ORAL | 0 refills | Status: DC

## 2023-01-21 NOTE — Assessment & Plan Note (Signed)
 Checking TSH and adjust levothyroxine 75 mcg daily as needed.

## 2023-01-21 NOTE — Telephone Encounter (Signed)
Patient was seen 01/19/23 by Dr. Okey Dupre and she said she forgot to mention she was having stomach cramping. She said it was a common occurrence and that Dr. Okey Dupre usually prescribes something to help. She would like to know if Dr. Okey Dupre can send that in for her again. Best callback is [phone].

## 2023-01-21 NOTE — Assessment & Plan Note (Signed)
Rx promethazine/dm to use intermittently for cough especially at night time so she can sleep better.

## 2023-01-21 NOTE — Telephone Encounter (Signed)
Sent in bentyl which she can use.

## 2023-01-21 NOTE — Assessment & Plan Note (Signed)
Checking B12, TSH, vitamin D, CBC CMP to assess. Likely nerve impingement most likely in the wrist.

## 2023-01-21 NOTE — Assessment & Plan Note (Signed)
Refilled valium which she uses rarely for sleep and anxiety rarely during the day.

## 2023-01-21 NOTE — Assessment & Plan Note (Signed)
Recent lipid up to date continue simvastatin 20 mg daily.

## 2023-01-21 NOTE — Assessment & Plan Note (Signed)
Rx triamcinolone ointment which was highly effective previous and same symptoms. If not resolved readdress.

## 2023-01-21 NOTE — Telephone Encounter (Signed)
Informed patient medication has been sent in.

## 2023-01-26 ENCOUNTER — Ambulatory Visit: Payer: Medicare Other

## 2023-03-08 ENCOUNTER — Ambulatory Visit: Payer: Medicare Other

## 2023-03-08 ENCOUNTER — Ambulatory Visit
Admission: RE | Admit: 2023-03-08 | Discharge: 2023-03-08 | Disposition: A | Discharge status: Home / Self Care | Payer: Medicare Other | Source: Ambulatory Visit | Attending: Internal Medicine | Admitting: Internal Medicine

## 2023-03-08 DIAGNOSIS — Z1231 Encounter for screening mammogram for malignant neoplasm of breast: Secondary | ICD-10-CM

## 2023-03-24 ENCOUNTER — Other Ambulatory Visit: Payer: Self-pay | Admitting: Internal Medicine

## 2023-05-17 ENCOUNTER — Telehealth: Payer: Self-pay | Admitting: Internal Medicine

## 2023-05-17 NOTE — Telephone Encounter (Signed)
Patient states having symptoms of cough, mucus and chest congestion. No available appointments at this time. Pharmacy is [facility]. Patient phone number is [phone] and [phone].

## 2023-05-17 NOTE — Telephone Encounter (Signed)
Called and spoke with the pt  She is c/o increased cough x 4 days  She is coughing up some green sputum  She has also had some wheezing, increased SOB  She is using prometh/codeine syrup without much help  She is also using her neb for her wheezing  She is asking for Doxy rx  Please advise, thanks! Allergies  Allergen Reactions   Moxifloxacin     REACTION: mild rash - causal??

## 2023-05-17 NOTE — Telephone Encounter (Signed)
Doxycycline 100 mg, # 14, 1 twice daily I hope this helps

## 2023-05-18 MED ORDER — DOXYCYCLINE HYCLATE 100 MG PO TABS: 100.0000 mg | ORAL_TABLET | Freq: Two times a day (BID) | ORAL | 0 refills | Status: DC

## 2023-05-18 NOTE — Telephone Encounter (Signed)
Spoke with the pt and notified of response per Dr Maple Hudson. Rx was sent. Nothing further needed.

## 2023-06-18 ENCOUNTER — Other Ambulatory Visit: Payer: Self-pay | Admitting: Internal Medicine

## 2023-07-16 ENCOUNTER — Ambulatory Visit: Payer: Self-pay | Admitting: Internal Medicine

## 2023-07-16 NOTE — Telephone Encounter (Signed)
  Chief Complaint: forehead pain, nasal congestion Symptoms: pain in forehead, nasal congestion,  Frequency: 2-3 weeks Pertinent Negatives: Patient denies fever, difficulty breathing,   Disposition: [] ED /[] Urgent Care (no appt availability in office) / [x] Appointment(In office/virtual)/ []  [facility]/ [] Home Care/ [] Refused Recommended Disposition /[] [facility]/ []  Follow-up with PCP  Additional Notes: Pt state she is using nasal washes multiple times per day  for 2-3 weeks.  Forehead pain. Soreness in the face with nasal congestion. Pt states that when she uses the neti pot or other nasal washes she experiences blood clots from her nose. Pt advised at time of call to stop using nasal washes at this time until PCP advises otherwise. Pt advised to use OTC nasal decongestants. Pt advised of care instructions per epic, agreeable. No appts today in pcp office, pt scheduled for next business day 3/3 with another provider in pcp office.   Copied from CRM [id number]. Topic: Clinical - Red Word Triage >> Jul 16, 2023 12:51 PM Theodis Sato wrote: Red Word that prompted transfer to Nurse Triage: Blowing clots of blood with nasal congestion and mucus with bad head pain. Ongoing for over 2 weeks. Reason for Disposition  [1] Sinus congestion (pressure, fullness) AND [2] present > 10 days  Answer Assessment - Initial Assessment Questions 1. LOCATION: "Where does it hurt?"      forehead 2. ONSET: "When did the sinus pain start?"  (e.g., hours, days)      2-3 weeks 3. SEVERITY: "How bad is the pain?"   (Scale 1-10; mild, moderate or severe)   - MILD (1-3): doesn't interfere with normal activities    - MODERATE (4-7): interferes with normal activities (e.g., work or school) or awakens from sleep   - SEVERE (8-10): excruciating pain and patient unable to do any normal activities        Right now a 7 4. RECURRENT SYMPTOM: "Have you ever had sinus problems before?" If Yes, ask: "When was  the last time?" and "What happened that time?"      Have disease that is not contagious like TB, so it could be that 5. NASAL CONGESTION: "Is the nose blocked?" If Yes, ask: "Can you open it or must you breathe through your mouth?"     Still can breathe thru nose 6. NASAL DISCHARGE: "Do you have discharge from your nose?" If so ask, "What color?"     Red, blood 7. FEVER: "Do you have a fever?" If Yes, ask: "What is it, how was it measured, and when did it start?"      denies 8. OTHER SYMPTOMS: "Do you have any other symptoms?" (e.g., sore throat, cough, earache, difficulty breathing)     Sore throat  Protocols used: Sinus Pain or Congestion-A-AH

## 2023-07-18 NOTE — Progress Notes (Unsigned)
 Subjective:    Patient ID: Marilyn Blake, female    DOB: [birth year], 83 y.o.   MRN: [id number]      HPI Marilyn Blake is here for No chief complaint on file.   She is here for an acute visit for cold symptoms.   Her symptoms started   She is experiencing   She has tried taking       Medications and allergies reviewed with patient and updated if appropriate.  Current Outpatient Medications on File Prior to Visit  Medication Sig Dispense Refill   azithromycin (ZITHROMAX) 250 MG tablet 2 today then one daily 6 tablet 0   calcium-vitamin D 250-100 MG-UNIT tablet Take 1 tablet by mouth 2 (two) times daily.     CALCIUM-VITAMIN D PO Take by mouth 1 day or 1 dose.     cetirizine (ZYRTEC) 10 MG tablet Take 10 mg by mouth daily.     ciprofloxacin (CIPRO) 500 MG tablet Take 1 tablet (500 mg total) by mouth 2 (two) times daily. 20 tablet 0   clotrimazole-betamethasone (LOTRISONE) cream APPLY  CREAM TOPICALLY TWICE DAILY 15 g 0   diazepam (VALIUM) 5 MG tablet Take 1 tablet (5 mg total) by mouth every 12 (twelve) hours as needed. for anxiety 60 tablet 0   dicyclomine (BENTYL) 10 MG capsule Take 1 capsule (10 mg total) by mouth 4 (four) times daily -  before meals and at bedtime. 30 capsule 0   doxycycline (VIBRA-TABS) 100 MG tablet Take 1 tablet (100 mg total) by mouth 2 (two) times daily. 14 tablet 0   ipratropium-albuterol (DUONEB) 0.5-2.5 (3) MG/3ML SOLN INHALE 3 ML BY NEBULIZER EVERY 6 HOURS AS NEEDED 360 mL 12   levothyroxine (SYNTHROID) 50 MCG tablet TAKE 1 TABLET BY MOUTH EVERY DAY 90 tablet 1   levothyroxine (SYNTHROID) 75 MCG tablet TAKE 1 TABLET BY MOUTH EVERY OTHER DAY ALTERNATING WITH 90 tablet 1   Multiple Minerals-Vitamins (CALCIUM & VIT D3 BONE HEALTH PO) Take by mouth. Reported on 09/09/2015     OMEGA 3 1000 MG CAPS Take by mouth daily.     omeprazole (PRILOSEC) 20 MG capsule TAKE 1 CAPSULE BY MOUTH EVERY DAY 90 capsule 1   Probiotic Product (PROBIOTIC-10 PO) Take by mouth  1 day or 1 dose.     promethazine-dextromethorphan (PROMETHAZINE-DM) 6.25-15 MG/5ML syrup Take 5 mLs by mouth 4 (four) times daily as needed. 118 mL 0   Psyllium (METAMUCIL PO) Take by mouth every other day.     simvastatin (ZOCOR) 20 MG tablet Take 1 tablet (20 mg total) by mouth at bedtime. 90 tablet 3   triamcinolone cream (KENALOG) 0.1 % APPLY TO AFFECTED AREA TWICE A DAY 90 g 0   No current facility-administered medications on file prior to visit.    Review of Systems     Objective:  There were no vitals filed for this visit. BP Readings from Last 3 Encounters:  01/19/23 100/64  11/09/22 112/64  06/19/22 112/60   Wt Readings from Last 3 Encounters:  01/19/23 104 lb (47.2 kg)  12/31/22 102 lb (46.3 kg)  11/09/22 102 lb 12.8 oz (46.6 kg)   There is no height or weight on file to calculate BMI.    Physical Exam Constitutional:      General: She is not in acute distress.    Appearance: Normal appearance. She is not ill-appearing.  HENT:     Head: Normocephalic and atraumatic.     Right Ear: Tympanic membrane,  ear canal and external ear normal.     Left Ear: Tympanic membrane, ear canal and external ear normal.     Mouth/Throat:     Mouth: Mucous membranes are moist.     Pharynx: No oropharyngeal exudate or posterior oropharyngeal erythema.  Eyes:     Conjunctiva/sclera: Conjunctivae normal.  Cardiovascular:     Rate and Rhythm: Normal rate and regular rhythm.  Pulmonary:     Effort: Pulmonary effort is normal. No respiratory distress.     Breath sounds: Normal breath sounds. No wheezing or rales.  Musculoskeletal:     Cervical back: Neck supple. No tenderness.  Lymphadenopathy:     Cervical: No cervical adenopathy.  Skin:    General: Skin is warm and dry.  Neurological:     Mental Status: She is alert.            Assessment & Plan:    See Problem List for Assessment and Plan of chronic medical problems.

## 2023-07-18 NOTE — Patient Instructions (Addendum)
     Medications changes include :   cough syrup, augmentin and eye drops     Return if symptoms worsen or fail to improve.  

## 2023-07-19 ENCOUNTER — Ambulatory Visit (INDEPENDENT_AMBULATORY_CARE_PROVIDER_SITE_OTHER): Payer: Medicare Other | Admitting: Internal Medicine

## 2023-07-19 ENCOUNTER — Encounter: Payer: Self-pay | Admitting: Internal Medicine

## 2023-07-19 VITALS — BP 100/64 | HR 97 | Temp 98.0°F | Ht 60.0 in | Wt 103.2 lb

## 2023-07-19 DIAGNOSIS — J0120 Acute ethmoidal sinusitis, unspecified: Secondary | ICD-10-CM

## 2023-07-19 DIAGNOSIS — J42 Unspecified chronic bronchitis: Secondary | ICD-10-CM | POA: Diagnosis not present

## 2023-07-19 DIAGNOSIS — J209 Acute bronchitis, unspecified: Secondary | ICD-10-CM

## 2023-07-19 MED ORDER — IPRATROPIUM-ALBUTEROL 0.5-2.5 (3) MG/3ML IN SOLN
RESPIRATORY_TRACT | 12 refills | Status: DC
Start: 2023-07-19 — End: 2024-02-29

## 2023-07-19 MED ORDER — CEPHALEXIN 500 MG PO CAPS: 500.0000 mg | ORAL_CAPSULE | Freq: Three times a day (TID) | ORAL | 0 refills | Status: AC

## 2023-07-30 ENCOUNTER — Telehealth: Payer: Self-pay | Admitting: Internal Medicine

## 2023-07-30 NOTE — Telephone Encounter (Signed)
 This PT is having sinus headaches and is concerned about them. Dr. Maple Hudson has no appts until May.Would he be willing to open a slot for her?  She has seen one Dr. about this. Please see her chart. She has issues that may be aggravating her condition.  All this to say this is not just a "sick call". Her # [phone]

## 2023-08-05 NOTE — Progress Notes (Signed)
 Patient ID: Marilyn Blake, female    DOB: 07-05-1940, 83 y.o.   MRN: [id number]  HPI 02/03/2011-83 year old female never smoker followed for bronchiectasis, history of MAIC bronchitis, allergic rhinitis.   --------------------------------------------------------------------------------------   11/09/22- 83 year old female never smoker followed for bronchiectasis, recurrent MAIC bronchitis, allergic rhinitis, insomnia, complicated by hypothyroid, IBS -Singulair, Acute visit- reported more SOB, chest tightness, productive cough, yellow, malaise, no fever. She was sick for 2 weeks in May with increased cough treated by her primary physician with doxycycline and nebulizer.  Still has some dry cough. Known MAC treated years ago.  We discussed uncertain level of activity and can check chest x-ray. CXR 05/15/21- IMPRESSION: No change in the appearance of the chest x-ray dating to 01/25/2019, with background fibrotic changes and no definite evidence of superimposed acute cardiopulmonary disease  08/06/23- 83 year old female never smoker followed for bronchiectasis, recurrent MAIC bronchitis, allergic rhinitis, insomnia, complicated by hypothyroid, IBS -Singulair, Discussed the use of AI scribe software for clinical note transcription with the patient, who gave verbal consent to proceed.  History of Present Illness   The patient, with a history of chronic mucus production and recent sinus infection, presents with persistent sinus symptoms despite a 7-day course of Keflex completed around March 10th. Symptoms began in mid-February and include headaches across the front of the head, which are relieved by pressure, and nosebleeds from both nostrils. The patient also reports a sensation of fullness in the ears and uses Flonase and saline for relief.  In addition to the sinus symptoms, the patient reports chest pain when lying down, which she describes as located in the middle of the breastbone. The  pain does not worsen with deep breaths. The patient's chronic mucus production has not changed with the sinus infection. The patient also reports recent stomach cramps, for which she has resumed taking a stomach medication.     CXR 11/13/22 IMPRESSION: 1. No radiographic evidence of acute cardiopulmonary disease. 2. COPD. 3. Chronic fibrosis/scarring.    Review of Systems- see HPI Constitutional:   No-   weight loss, +night sweats, , chills, +fatigue, lassitude. HEENT:   +  headaches, difficulty swallowing, tooth/dental problems, sore throat,       No-  sneezing, itching, ear ache, +nasal congestion, post nasal drip,  CV:  atypicalchest pain, orthopnea, PND, swelling in lower extremities, anasarca,dizziness, palpitations Resp: + shortness of breath with exertion or at rest.             + productive cough,   non-productive cough,  no- coughing up of blood.              No- change in color of mucus.  No- wheezing.   Skin: No-   rash or lesions. GI:  No-   heartburn, indigestion, abdominal pain, nausea, vomiting,  GU:  MS:  No-   joint pain or swelling. . Neuro- grossly normal to observation,   Psych:  No- change in mood or affect. No depression or anxiety.  No memory loss.  Objective:   Physical Exam General- Alert, Oriented, Affect-appropriate, Distress- none acute, petite/ thin lady/ frail Skin- rash-none, lesions- none, excoriation- none Lymphadenopathy- none Head- atraumatic            Eyes- Gross vision intact, PERRLA, conjunctivae clear secretions            Ears-TMs and canals- normal and clear            Nose- Clear, No-Septal dev, mucus, polyps, erosion, perforation  Throat- Mallampati II , mucosa + red tonsil crypts/ no exudate , derainage- none, tonsils- atrophic, Neck- flexible , trachea midline, no stridor , thyroid nl, carotid no bruit Chest - symmetrical excursion , unlabored           Heart/CV- RRR , no murmur , no gallop  , no rub, nl s1 s2                            - JVD- none , edema- none, stasis changes- none, varices- none           Lung-  Unlabored,+ few scattered crackles,  wheeze-none, cough+dry , dullness-none, rub-none           Chest wall-  Abd- Br/ Gen/ Rectal- Not done, not indicated Extrem- cyanosis- none, clubbing, none, atrophy- none, strength- nl Neuro- grossly intact to observation Assessment and Plan:   Chronic Bronchitis with exacerbation Chronic Sinusitis Persistent sinusitis with headaches, nasal congestion, and epistaxis. Initial treatment with Keflex ineffective. Augmentin preferred for efficacy. CT scan warranted for persistent symptoms. - Prescribe Augmentin with food for a short course. - Order limited CT scan of sinuses. - Advise against Flonase; continue saline nasal spray. - Continue neti pot for nasal irrigation.  Chronic Cough and Mucus Production Chronic cough and mucus production unchanged with sinus infection. Monitoring required due to lung scarring. - Order chest x-ray to monitor changes.  Chest Pain Chest pain likely due to acid reflux, not cardiac or pulmonary causes. - Recommend over-the-counter Pepcid once daily before breakfast.

## 2023-08-06 ENCOUNTER — Encounter: Payer: Self-pay | Admitting: Internal Medicine

## 2023-08-06 ENCOUNTER — Ambulatory Visit

## 2023-08-06 ENCOUNTER — Ambulatory Visit (INDEPENDENT_AMBULATORY_CARE_PROVIDER_SITE_OTHER): Admitting: Internal Medicine

## 2023-08-06 VITALS — BP 118/68 | HR 79 | Temp 97.7°F | Ht 61.0 in | Wt 104.2 lb

## 2023-08-06 DIAGNOSIS — R079 Chest pain, unspecified: Secondary | ICD-10-CM | POA: Diagnosis not present

## 2023-08-06 DIAGNOSIS — J42 Unspecified chronic bronchitis: Secondary | ICD-10-CM | POA: Diagnosis not present

## 2023-08-06 DIAGNOSIS — J0100 Acute maxillary sinusitis, unspecified: Secondary | ICD-10-CM

## 2023-08-06 DIAGNOSIS — J209 Acute bronchitis, unspecified: Secondary | ICD-10-CM | POA: Diagnosis not present

## 2023-08-06 MED ORDER — AMOXICILLIN-POT CLAVULANATE 500-125 MG PO TABS: ORAL_TABLET | ORAL | 0 refills | Status: DC

## 2023-08-06 NOTE — Patient Instructions (Signed)
 Order- CXR dx Chronic bronchitis, remote MAC  Order- CT sinus without contrast   dx acute maxillary sinusitis    (do at [facility])  Script sent for augmentin antibiotic  Leave Flonase off till this settles down  Suggest you add an otc acid blocker like Pepcid, 1 each morning before breakfast. See if this helps the soreness in your chest.

## 2023-08-06 NOTE — Telephone Encounter (Signed)
 Called patient.  Patient has OV with Dr. Maple Hudson today at 11:00 am.  Will close this encounter.

## 2023-08-09 ENCOUNTER — Telehealth: Payer: Self-pay | Admitting: Internal Medicine

## 2023-08-09 NOTE — Telephone Encounter (Signed)
 Patient would like to schedule CT scan. Patient phone number is [phone] and [phone].

## 2023-08-11 ENCOUNTER — Ambulatory Visit (HOSPITAL_BASED_OUTPATIENT_CLINIC_OR_DEPARTMENT_OTHER)
Admission: RE | Admit: 2023-08-11 | Discharge: 2023-08-11 | Disposition: A | Discharge status: Home / Self Care | Source: Ambulatory Visit | Attending: Internal Medicine | Admitting: Internal Medicine

## 2023-08-11 DIAGNOSIS — J0100 Acute maxillary sinusitis, unspecified: Secondary | ICD-10-CM | POA: Insufficient documentation

## 2023-08-11 NOTE — Telephone Encounter (Signed)
Scan has been scheduled. 

## 2023-08-12 ENCOUNTER — Encounter: Payer: Self-pay | Admitting: Internal Medicine

## 2023-08-13 ENCOUNTER — Other Ambulatory Visit (HOSPITAL_COMMUNITY)

## 2023-08-25 ENCOUNTER — Ambulatory Visit: Payer: Self-pay

## 2023-08-25 NOTE — Telephone Encounter (Signed)
 Chief Complaint: chest pain Symptoms: chest pain, HTN, headaches Frequency: 1 month Pertinent Negatives: Patient denies blurry vision, SOB, one-sided weakness, visual changes, one-sided weakness Disposition: [x] ED /[] Urgent Care (no appt availability in office) / [] Appointment(In office/virtual)/ []  [facility]/ [] Home Care/ [] Refused Recommended Disposition /[] [facility]/ []  Follow-up with PCP Additional Notes: Daughter reports pt has had 5/10 "burning" intermittent CP for one month with concerns about HTN and frequent headaches. Pt saw her pulm doctor 3/21 and he performed a CXR (stable) and head CT (they are still awaiting results). No hx of HTN, takes no BP meds. Daughter states it went to the dentist on 3/31 and her BP was 173/93. Normally it is 100/60. Daughter has been taking her BP lately. It was 150/78 yesterday and 148/73 today. For intermittent CP with headaches and HTN RN advised pt go to the ED. Daughter agreeable, states she will talk to the pt about it. Daughter asked if RN could reschedule their 5/1 appt in the office with Okey Dupre for sooner. That appt shows up as cancelled d/t provider being OOO. RN scheduled pt for 4/25 at 1220 but advised daughter that the pt needs to go to the ED. Pt asked about UC. RN educated daughter about the difference between UC and ED and that ED is more appropriate here. Daughter verbalized understanding. RN advised daughter to call Ems for worsening.    Copied from CRM [id number]. Topic: Clinical - Red Word Triage >> Aug 25, 2023  3:50 PM Taleah C wrote: Red Word that prompted transfer to Nurse Triage: blood pressure has been high for several days, chest pain Reason for Disposition  [1] Chest pain (or "angina") comes and goes AND [2] is happening more often (increasing in frequency) or getting worse (increasing in severity)  (Exception: Chest pains that last only a few seconds.)  Answer Assessment - Initial Assessment Questions 1.  LOCATION: "Where does it hurt?"       "Burning" chest pain 2. RADIATION: "Does the pain go anywhere else?" (e.g., into neck, jaw, arms, back)     No 3. ONSET: "When did the chest pain begin?" (Minutes, hours or days)      Has been having burning CP since 3/21 when she saw her pulm doctor, he did a CXR and a head CT d/t headaches for at least a month. CP for a month. 4. PATTERN: "Does the pain come and go, or has it been constant since it started?"  "Does it get worse with exertion?"      Comes and goes  5. DURATION: "How long does it last" (e.g., seconds, minutes, hours)     "It just depends", maybe 30 minutes  6. SEVERITY: "How bad is the pain?"  (e.g., Scale 1-10; mild, moderate, or severe)    - MILD (1-3): doesn't interfere with normal activities     - MODERATE (4-7): interferes with normal activities or awakens from sleep    - SEVERE (8-10): excruciating pain, unable to do any normal activities       5-6/10 7. CARDIAC RISK FACTORS: "Do you have any history of heart problems or risk factors for heart disease?" (e.g., angina, prior heart attack; diabetes, high blood pressure, high cholesterol, smoker, or strong family history of heart disease)     Hyperlipidemia  8. 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 CAUSE: "What do  you think is causing the chest pain?"     Not sure 10. OTHER SYMPTOMS: "Do you have any other symptoms?" (e.g., dizziness, nausea, vomiting, sweating, fever, difficulty breathing, cough)       3/31 her BP was 173/93 at the dentist. [facility] it was 150/78. Today it 148/73. Normally it is 100/60s. Headaches for 1 month. Denies N/V. Denies SOB but she does have "pulmonary issues and breathes heavy anyway", breathing has not changed. Denies diaphoresis. Denies blurry vision.  Takes omeprazole. Denies one-sided weakness, facial droop, slurred  speech  Protocols used: Chest Pain-A-AH

## 2023-08-26 ENCOUNTER — Telehealth: Payer: Self-pay

## 2023-08-26 NOTE — Telephone Encounter (Signed)
 Copied from CRM [id number]. Topic: Clinical - Lab/Test Results >> Aug 25, 2023  3:37 PM Orinda Kenner C wrote: Reason for CRM: Patient's child Darl Pikes [phone] wants to have results for CT sinus. Please call back.  I called and spoke to pt. I informed pt that we do not have the results yet since she just had this done. I informed pt that these can take up to 2-3 weeks to come back. Pt verbalized understanding. NFN  Darl Pikes is NOT on DPR.

## 2023-08-27 ENCOUNTER — Telehealth: Payer: Self-pay

## 2023-08-27 NOTE — Telephone Encounter (Signed)
-----   Message from [place] sent at 08/25/2023  2:48 PM EDT ----- CXR is stable with chronic fibrotic scarring, unchanged.

## 2023-08-27 NOTE — Telephone Encounter (Signed)
 Patient was called about chest X-ray. She verbalized understanding. 08/27/2023

## 2023-09-10 ENCOUNTER — Ambulatory Visit: Admitting: Internal Medicine

## 2023-09-10 ENCOUNTER — Telehealth: Payer: Self-pay

## 2023-09-10 NOTE — Progress Notes (Signed)
 Error

## 2023-09-10 NOTE — Telephone Encounter (Signed)
 LMTCB in reagrds to results

## 2023-09-10 NOTE — Telephone Encounter (Signed)
-----   Message from [place] sent at 09/09/2023  9:20 PM EDT ----- CT of sinuses- no sinusitis, normal drainage. Incidental deviation to the right of the nasal septum. I don't suggest we do anything abut that.

## 2023-09-13 NOTE — Telephone Encounter (Signed)
 CRM received stating pt called back for ct results, please advise

## 2023-09-14 ENCOUNTER — Other Ambulatory Visit: Payer: Self-pay | Admitting: Internal Medicine

## 2023-09-14 NOTE — Progress Notes (Signed)
 Message sent to pt on mychart to notify

## 2023-09-14 NOTE — Telephone Encounter (Signed)
 Please advise if appropriate to continue

## 2023-09-14 NOTE — Telephone Encounter (Signed)
Doxycycline refilled.

## 2023-09-15 ENCOUNTER — Other Ambulatory Visit: Payer: Self-pay | Admitting: Internal Medicine

## 2023-09-16 ENCOUNTER — Ambulatory Visit: Admitting: Internal Medicine

## 2023-11-04 ENCOUNTER — Ambulatory Visit: Payer: Medicare Other | Admitting: Internal Medicine

## 2023-12-10 ENCOUNTER — Other Ambulatory Visit: Payer: Self-pay | Admitting: Internal Medicine

## 2024-01-03 ENCOUNTER — Ambulatory Visit: Payer: Medicare Other

## 2024-01-10 ENCOUNTER — Telehealth: Payer: Self-pay

## 2024-01-10 ENCOUNTER — Ambulatory Visit (INDEPENDENT_AMBULATORY_CARE_PROVIDER_SITE_OTHER)

## 2024-01-10 VITALS — Ht 61.0 in | Wt 104.0 lb

## 2024-01-10 DIAGNOSIS — Z0000 Encounter for general adult medical examination without abnormal findings: Secondary | ICD-10-CM

## 2024-01-10 NOTE — Telephone Encounter (Signed)
 Spoke with patient regarding prior message. Patient stated she was having some cough and congestion and she had some Doxycycline  and has taken them and it has helped but patient has been having some stomach  cramps. Dr.Young can you please advise.

## 2024-01-10 NOTE — Progress Notes (Signed)
 Subjective:   Marilyn Blake is a 83 y.o. who presents for a Medicare Wellness preventive visit.  As a reminder, Annual Wellness Visits don't include a physical exam, and some assessments may be limited, especially if this visit is performed virtually. We may recommend an in-person follow-up visit with your provider if needed.  Visit Complete: Virtual I connected with  Marilyn Blake on 01/10/24 by a audio enabled telemedicine application and verified that I am speaking with the correct person using two identifiers.  Patient Location: Home  Provider Location: Home Office  I discussed the limitations of evaluation and management by telemedicine. The patient expressed understanding and agreed to proceed.  Vital Signs: Because this visit was a virtual/telehealth visit, some criteria may be missing or patient reported. Any vitals not documented were not able to be obtained and vitals that have been documented are patient reported.  VideoDeclined- This patient declined Librarian, academic. Therefore the visit was completed with audio only.  Persons Participating in Visit: Patient.  AWV Questionnaire: No: Patient Medicare AWV questionnaire was not completed prior to this visit.  Cardiac Risk Factors include: advanced age (>72men, >43 women);dyslipidemia     Objective:    Today's Vitals   01/10/24 1107  Weight: 104 lb (47.2 kg)  Height: 5' 1 (1.549 m)   Body mass index is 19.65 kg/m.     01/10/2024   11:36 AM 12/31/2022    1:06 PM 11/03/2021    1:13 PM 11/24/2019   11:57 AM 07/25/2018    1:32 PM 06/17/2017    1:45 PM 11/12/2016    2:31 PM  Advanced Directives  Does Patient Have a Medical Advance Directive? Yes Yes No Yes Yes  No  No   Type of Building services engineer Power of [employer] Power of [place];Living will    Does patient want to make changes to medical advance directive?   No  - Patient declined No - Patient declined     Copy of Healthcare Power of Attorney in Chart?  No - copy requested  No - copy requested No - copy requested     Would patient like information on creating a medical advance directive?      Yes (ED - Information included in AVS)  No - Patient declined      Data saved with a previous flowsheet row definition    Current Medications (verified) Outpatient Encounter Medications as of 01/10/2024  Medication Sig   cetirizine (ZYRTEC) 10 MG tablet Take 10 mg by mouth daily.   clotrimazole -betamethasone  (LOTRISONE ) cream APPLY  CREAM TOPICALLY TWICE DAILY   diazepam  (VALIUM ) 5 MG tablet Take 1 tablet (5 mg total) by mouth every 12 (twelve) hours as needed. for anxiety   dicyclomine  (BENTYL ) 10 MG capsule TAKE 1 CAPSULE (10 MG TOTAL) BY MOUTH 4 TIMES A DAY BEFORE MEALS AND AT BEDTIME   doxycycline  (VIBRA -TABS) 100 MG tablet TAKE 1 TABLET BY MOUTH TWICE A DAY   ipratropium-albuterol  (DUONEB) 0.5-2.5 (3) MG/3ML SOLN INHALE 3 ML BY NEBULIZER EVERY 6 HOURS AS NEEDED   levothyroxine  (SYNTHROID ) 50 MCG tablet TAKE 1 TABLET BY MOUTH EVERY DAY   levothyroxine  (SYNTHROID ) 75 MCG tablet TAKE 1 TABLET BY MOUTH EVERY OTHER DAY ALTERNATING WITH   Multiple Minerals-Vitamins (CALCIUM & VIT D3 BONE HEALTH PO) Take by mouth. Reported on 09/09/2015   OMEGA 3 1000 MG CAPS Take by mouth daily.   omeprazole  (PRILOSEC) 20  MG capsule TAKE 1 CAPSULE BY MOUTH EVERY DAY   Probiotic Product (PROBIOTIC-10 PO) Take by mouth 1 day or 1 dose.   promethazine -dextromethorphan (PROMETHAZINE -DM) 6.25-15 MG/5ML syrup Take 5 mLs by mouth 4 (four) times daily as needed.   Psyllium (METAMUCIL PO) Take by mouth every other day.   simvastatin  (ZOCOR ) 20 MG tablet TAKE 1 TABLET BY MOUTH AT BEDTIME   triamcinolone  cream (KENALOG ) 0.1 % APPLY TO AFFECTED AREA TWICE A DAY   amoxicillin -clavulanate (AUGMENTIN ) 500-125 MG tablet 1 twice daily (Patient not taking: Reported on 01/10/2024)   No  facility-administered encounter medications on file as of 01/10/2024.    Allergies (verified) Ethambutol hcl and Moxifloxacin   History: Past Medical History:  Diagnosis Date   ALLERGIC RHINITIS    ANXIETY    Arthritis    BACTEREMIA, MYCOBACTERIUM AVIUM COMPLEX    BRONCHIECTASIS    COLONIC POLYPS, HX OF    Fundic gland polyps of stomach, benign    GERD    GOUT    Headache    HYPERLIPIDEMIA    HYPOTHYROIDISM    Irritable bowel syndrome    OSTEOPENIA    Past Surgical History:  Procedure Laterality Date   ABDOMINAL HYSTERECTOMY     BLADDER SUSPENSION     CARPAL TUNNEL RELEASE Left    COLONOSCOPY     MASS EXCISION Right 08/10/2013   Procedure: DEBRIDE DISTAL INTERPHALANGEAL JOINT/EXCISION MUCOID CYST RIGHT LONG FINGER and right small finger;  Surgeon: Lamar LULLA Leonor Mickey., MD;  Location: [facility];  Service: Orthopedics;  Laterality: Right;   THYROIDECTOMY     Hurthele cell tumor   UPPER GASTROINTESTINAL ENDOSCOPY     Family History  Problem Relation Age of Onset   Cervical cancer Mother 32        died age 27   Heart attack Father 4   Colon cancer Neg Hx    Esophageal cancer Neg Hx    Stomach cancer Neg Hx    Rectal cancer Neg Hx    Social History   Socioeconomic History   Marital status: Widowed    Spouse name: Not on file   Number of children: 3   Years of education: Not on file   Highest education level: Not on file  Occupational History   Occupation: Retired    Associate Professor: RETIRED  Tobacco Use   Smoking status: Never   Smokeless tobacco: Never   Tobacco comments:    Married, Insurance claims handler   Vaping status: Never Used  Substance and Sexual Activity   Alcohol use: No    Alcohol/week: 0.0 standard drinks of alcohol   Drug use: No   Sexual activity: Never  Other Topics Concern   Not on file  Social History Narrative   Lives alone/2025   Social Drivers of Health   Financial Resource Strain: Low Risk  (01/10/2024)   Overall  Financial Resource Strain (CARDIA)    Difficulty of Paying Living Expenses: Not hard at all  Food Insecurity: No Food Insecurity (01/10/2024)   Hunger Vital Sign    Worried About Running Out of Food in the Last Year: Never true    Ran Out of Food in the Last Year: Never true  Transportation Needs: No Transportation Needs (01/10/2024)   PRAPARE - Administrator, Civil Service (Medical): No    Lack of Transportation (Non-Medical): No  Physical Activity: Inactive (01/10/2024)   Exercise Vital Sign    Days of Exercise per Week:  0 days    Minutes of Exercise per Session: 0 min  Stress: No Stress Concern Present (01/10/2024)   [employer] of Occupational Health - Occupational Stress Questionnaire    Feeling of Stress: Only a little  Social Connections: Moderately Integrated (01/10/2024)   Social Connection and Isolation Panel    Frequency of Communication with Friends and Family: Three times a week    Frequency of Social Gatherings with Friends and Family: Never    Attends Religious Services: More than 4 times per year    Active Member of [employer] or Organizations: Yes    Attends Banker Meetings: Never    Marital Status: Widowed    Tobacco Counseling Counseling given: Not Answered Tobacco comments: Married, Housewife    Clinical Intake:  Pre-visit preparation completed: Yes  Pain : No/denies pain     BMI - recorded: 19.65 Nutritional Status: BMI of 19-24  Normal Nutritional Risks: Nausea/ vomitting/ diarrhea (nausea) Diabetes: No  Lab Results  Component Value Date   HGBA1C 5.8 12/23/2017     How often do you need to have someone help you when you read instructions, pamphlets, or other written materials from your doctor or pharmacy?: 1 - Never  Interpreter Needed?: No  Information entered by :: Angie Piercey, RMA   Activities of Daily Living     01/10/2024   11:08 AM  In your present state of health, do you have any difficulty performing  the following activities:  Hearing? 1  Comment need hearing test  Vision? 0  Difficulty concentrating or making decisions? 0  Walking or climbing stairs? 0  Dressing or bathing? 0  Doing errands, shopping? 0  Preparing Food and eating ? N  Using the Toilet? N  In the past six months, have you accidently leaked urine? N  Do you have problems with loss of bowel control? N  Managing your Medications? N  Managing your Finances? N  Housekeeping or managing your Housekeeping? N    Patient Care Team: Rollene Almarie LABOR, MD as PCP - General (Internal Medicine) Neysa Reggy BIRCH, MD (Pulmonary Disease) Avram Lupita BRAVO, MD (Gastroenterology) Randall Gravel (Dentistry)  I have updated your Care Teams any recent Medical Services you may have received from other providers in the past year.     Assessment:   This is a routine wellness examination for Marilyn Blake.  Hearing/Vision screen Hearing Screening - Comments:: Would like a hearing test Vision Screening - Comments:: Denies vision issues./Cataract surgery/[address]    Goals Addressed               This Visit's Progress     Patient Stated (pt-stated)        Maintain health       Depression Screen     01/10/2024   11:44 AM 12/31/2022    1:10 PM 06/19/2022    2:43 PM 11/03/2021    1:14 PM 11/03/2021    1:11 PM 12/19/2020   10:47 AM 11/24/2019   11:55 AM  PHQ 2/9 Scores  PHQ - 2 Score 1 0 0 2 2 0 0  PHQ- 9 Score 2 0 0 9 10      Fall Risk     01/10/2024   11:38 AM 01/19/2023    2:05 PM 12/31/2022    1:09 PM 06/19/2022    2:43 PM 12/19/2020   10:47 AM  Fall Risk   Falls in the past year? 0 0 0 1 0  Number falls in past  yr: 0 0 0 0 0  Injury with Fall? 0 0 0 1 0  Risk for fall due to :   No Fall Risks    Follow up Falls evaluation completed;Falls prevention discussed Falls evaluation completed Falls prevention discussed      MEDICARE RISK AT HOME:  Medicare Risk at Home Any stairs in or around the home?: Yes  (basement) If so, are there any without handrails?: No Home free of loose throw rugs in walkways, pet beds, electrical cords, etc?: Yes Adequate lighting in your home to reduce risk of falls?: Yes Life alert?: No Use of a cane, walker or w/c?: No Grab bars in the bathroom?: Yes Shower chair or bench in shower?: Yes Elevated toilet seat or a handicapped toilet?: Yes  TIMED UP AND GO:  Was the test performed?  No  Cognitive Function: 6CIT completed    06/17/2017    1:58 PM  MMSE - Mini Mental State Exam  Orientation to time 5   Orientation to Place 5   Registration 3   Attention/ Calculation 5   Recall 2   Language- name 2 objects 2   Language- repeat 1  Language- follow 3 step command 3   Language- read & follow direction 1   Write a sentence 1   Copy design 1   Total score 29      Data saved with a previous flowsheet row definition        01/10/2024   11:38 AM 12/31/2022    1:10 PM  6CIT Screen  What Year? 0 points 0 points  What month? 0 points 0 points  What time? 0 points 0 points  Count back from 20 0 points 0 points  Months in reverse 0 points 0 points  Repeat phrase 0 points 0 points  Total Score 0 points 0 points    Immunizations Immunization History  Administered Date(s) Administered   Fluad Quad(high Dose 65+) 01/25/2019, 02/14/2021   Fluad Trivalent(High Dose 65+) 03/24/2023   INFLUENZA, HIGH DOSE SEASONAL PF 02/11/2016, 03/12/2018   Influenza Split 02/03/2011, 02/15/2012   Influenza Whole 02/16/2008, 03/25/2009, 02/03/2010   Influenza,inj,Quad PF,6+ Mos 01/18/2013, 03/15/2014, 01/31/2015   Influenza-Unspecified 01/31/2015, 04/01/2017, 03/12/2018, 03/29/2020   PFIZER(Purple Top)SARS-COV-2 Vaccination 07/11/2019, 08/01/2019, 04/17/2020   PNEUMOCOCCAL CONJUGATE-20 05/15/2021   Pneumococcal Conjugate-13 03/27/2014   Pneumococcal Polysaccharide-23 08/14/2008   Td 03/27/2009   Tdap 09/20/2019   Zoster Recombinant(Shingrix) 10/15/2021    Screening  Tests Health Maintenance  Topic Date Due   Zoster Vaccines- Shingrix (2 of 2) 12/10/2021   COVID-19 Vaccine (4 - 2024-25 season) 01/17/2023   INFLUENZA VACCINE  12/17/2023   Medicare Annual Wellness (AWV)  01/09/2025   DTaP/Tdap/Td (3 - Td or Tdap) 09/19/2029   Pneumococcal Vaccine: 50+ Years  Completed   DEXA SCAN  Completed   HPV VACCINES  Aged Out   Meningococcal B Vaccine  Aged Out    Health Maintenance  Health Maintenance Due  Topic Date Due   Zoster Vaccines- Shingrix (2 of 2) 12/10/2021   COVID-19 Vaccine (4 - 2024-25 season) 01/17/2023   INFLUENZA VACCINE  12/17/2023   Health Maintenance Items Addressed: See Nurse Notes at the end of this note  Additional Screening:  Vision Screening: Recommended annual ophthalmology exams for early detection of glaucoma and other disorders of the eye. Would you like a referral to an eye doctor? No    Dental Screening: Recommended annual dental exams for proper oral hygiene  Community Resource Referral / Chronic  Care Management: CRR required this visit?  No   CCM required this visit?  No   Plan:    I have personally reviewed and noted the following in the patient's chart:   Medical and social history Use of alcohol, tobacco or illicit drugs  Current medications and supplements including opioid prescriptions. Patient is not currently taking opioid prescriptions. Functional ability and status Nutritional status Physical activity Advanced directives List of other physicians Hospitalizations, surgeries, and ER visits in previous 12 months Vitals Screenings to include cognitive, depression, and falls Referrals and appointments  In addition, I have reviewed and discussed with patient certain preventive protocols, quality metrics, and best practice recommendations. A written personalized care plan for preventive services as well as general preventive health recommendations were provided to patient.   Azalya Galyon L Shakeem Stern,  CMA   01/10/2024   After Visit Summary: (MyChart) Due to this being a telephonic visit, the after visit summary with patients personalized plan was offered to patient via MyChart   Notes: Patient would like to discuss getting set up for a hearing test.  She would like to discuss that during her next office visit.  Patient had no other concerns to address today.

## 2024-01-10 NOTE — Telephone Encounter (Signed)
 Antibiotics may cause stomach upset. Sorry the doxycycline  bothered her. Was she able to take enough to get her infection cleared up? Or does she need us  to send a different antibiotic?

## 2024-01-10 NOTE — Patient Instructions (Signed)
 Ms. Bozard , Thank you for taking time out of your busy schedule to complete your Annual Wellness Visit with me. I enjoyed our conversation and look forward to speaking with you again next year. I, as well as your care team,  appreciate your ongoing commitment to your health goals. Please review the following plan we discussed and let me know if I can assist you in the future. Your Game plan/ To Do List    Follow up Visits: We will see or speak with you next year for your Next Medicare AWV with our clinical staff Have you seen your provider in the last 6 months (3 months if uncontrolled diabetes)? Yes.  Last visit on 07/19/2023.  Clinician Recommendations:  Aim for 30 minutes of exercise or brisk walking, 6-8 glasses of water, and 5 servings of fruits and vegetables each day.       This is a list of the screenings recommended for you:  Health Maintenance  Topic Date Due   Zoster (Shingles) Vaccine (2 of 2) 12/10/2021   COVID-19 Vaccine (4 - 2024-25 season) 01/17/2023   Flu Shot  12/17/2023   Medicare Annual Wellness Visit  01/09/2025   DTaP/Tdap/Td vaccine (3 - Td or Tdap) 09/19/2029   Pneumococcal Vaccine for age over 28  Completed   DEXA scan (bone density measurement)  Completed   HPV Vaccine  Aged Out   Meningitis B Vaccine  Aged Out    Advanced directives: (Copy Requested) Please bring a copy of your health care power of attorney and living will to the office to be added to your chart at your convenience. You can mail to [facility] [address] [place], [place] [address] or email to [email or web address] Advance Care Planning is important because it:  [x]  Makes sure you receive the medical care that is consistent with your values, goals, and preferences  [x]  It provides guidance to your family and loved ones and reduces their decisional burden about whether or not they are making the right decisions based on your wishes.  Follow the link provided in your  after visit summary or read over the paperwork we have mailed to you to help you started getting your Advance Directives in place. If you need assistance in completing these, please reach out to us  so that we can help you!  See attachments for Preventive Care and Fall Prevention Tips.

## 2024-01-10 NOTE — Telephone Encounter (Signed)
 Spoke with patient regarding prior message. Dr.Young patient would like for you to send something else to patient's pharmacy of choice .

## 2024-01-10 NOTE — Telephone Encounter (Signed)
 Copied from CRM #[id number]. Topic: Clinical - Medication Question >> Jan 07, 2024 10:53 AM Celestine FALCON wrote: Reason for CRM: Pt is calling due to having a cough and congestion, and she is wanting to know if Dr. Neysa would prescribe her a medication to assist with it.   She stated Dr. Neysa has prescribed doxycycline  (VIBRA -TABS) 100 MG tablet and amoxicillin -clavulanate (AUGMENTIN ) 500-125 MG tablet to her before, and she is wanting to know if that is the medication he would want her on.  [facility] [address], [place] - [address] [place] [address] Phone: [phone] Fax: [phone]  Pt's phone number: [phone] ok to leave a vm.  ATC X1 LVM for patient to call our office back.

## 2024-01-11 NOTE — Telephone Encounter (Signed)
 Copied from CRM [id number]. Topic: Clinical - Medication Question >> Jan 11, 2024  9:32 AM Isabell A wrote: Reason for CRM: Patient states it was her understanding that she was suppose to receive an antibiotic yesterday.  Callback number: [phone]   Patient calling back, Dr. Neysa can you please advise

## 2024-01-12 MED ORDER — AMOXICILLIN-POT CLAVULANATE 875-125 MG PO TABS: 1.0000 | ORAL_TABLET | Freq: Two times a day (BID) | ORAL | 0 refills | Status: DC

## 2024-01-12 NOTE — Telephone Encounter (Signed)
Pt is aware. Nothing further needed 

## 2024-01-12 NOTE — Telephone Encounter (Signed)
 [facility]

## 2024-01-24 ENCOUNTER — Ambulatory Visit: Admitting: Internal Medicine

## 2024-01-27 ENCOUNTER — Ambulatory Visit: Payer: Self-pay | Admitting: Internal Medicine

## 2024-01-27 NOTE — Telephone Encounter (Signed)
Please send doxycycline 100 mg, # 14, 1 twice daily 

## 2024-01-27 NOTE — Telephone Encounter (Signed)
 Please advise if ABX appropriate

## 2024-01-27 NOTE — Telephone Encounter (Signed)
 FYI Only or Action Required?: Action required by provider: clinical question for provider.  Patient is followed in Pulmonology for bronchiectasis, last seen on 08/06/2023 by Neysa Reggy BIRCH, MD.  Called Nurse Triage reporting Hemoptysis.  Symptoms began yesterday.  Symptoms are: completely resolved.  Triage Disposition: Home Care  Patient/caregiver understands and will follow disposition?: Yes         Copied from CRM [id number]. Topic: Clinical - Red Word Triage >> Jan 27, 2024 12:06 PM Whitney O wrote: [facility] that prompted transfer to Nurse Triage: coughed up blood late yesterday  talk with dr young a couple of weeks ago micro bacteria and he sent a prescription for amoxicillin  and i finished it up and over the weekend  my sinuses . And coughed up blood Has been taking sinus medication with Asprin in it . Needing to know if I need a antibiotic  or I dont know           Reason for Disposition  Few streaks of blood mixed in with yellow or green sputum (all other triage questions negative)  Answer Assessment - Initial Assessment Questions Patient reports three episodes of streaky blood after coughing yesterday. She states at this time she has not noticed any blood, and believes it was due to her taking Aspirin . Patient reports she is still experiencing a cough with green sputum and wanted to know if Dr. Neysa wanted to prescribe her anything for that. Patient reports she is unable to come in for an available appointment today due to transportation. Please advise.       1. ONSET: When did the cough begin?      Yesterday  2. SEVERITY: How bad is the cough today? Did the blood appear after a coughing spell?      Came up after a few coughs  3. SPUTUM: Describe the color of your sputum (e.g., none, dry cough; clear, white, yellow, green)     Green, which she states she has all the time  4. HEMOPTYSIS: How much blood? (e.g., flecks, streaks, tablespoons)      Bright red streaks, occurred 2-3 times  5. DIFFICULTY BREATHING: Are you having difficulty breathing? If Yes, ask: How bad is it? (e.g., mild, moderate, severe)      No worse than her usual   6. FEVER: Do you have a fever? If Yes, ask: What is your temperature, how was it measured, and when did it start?     No 7. CARDIAC HISTORY: Do you have any history of heart disease? (e.g., heart attack, congestive heart failure)      No 8. LUNG HISTORY: Do you have any history of lung disease?  (e.g., pulmonary embolus, asthma, emphysema)     Yes 9. PE RISK FACTORS: Do you have a history of blood clots? Note: Other risk factors include recent major surgery, recent prolonged travel, being bedridden.     No 10. OTHER SYMPTOMS: Do you have any other symptoms? (e.g., runny nose, wheezing, chest pain)       Sinus congestion, cough  Protocols used: Coughing Up Blood-A-AH

## 2024-01-27 NOTE — Telephone Encounter (Signed)
 Lm for patient.

## 2024-02-01 ENCOUNTER — Ambulatory Visit: Admitting: Internal Medicine

## 2024-02-01 ENCOUNTER — Encounter: Payer: Self-pay | Admitting: Internal Medicine

## 2024-02-01 ENCOUNTER — Ambulatory Visit: Payer: Self-pay | Admitting: Internal Medicine

## 2024-02-01 ENCOUNTER — Telehealth: Payer: Self-pay

## 2024-02-01 ENCOUNTER — Other Ambulatory Visit (HOSPITAL_COMMUNITY): Payer: Self-pay

## 2024-02-01 VITALS — BP 122/80 | HR 87 | Temp 97.6°F | Ht 61.0 in | Wt 101.0 lb

## 2024-02-01 DIAGNOSIS — E782 Mixed hyperlipidemia: Secondary | ICD-10-CM | POA: Diagnosis not present

## 2024-02-01 DIAGNOSIS — F419 Anxiety disorder, unspecified: Secondary | ICD-10-CM

## 2024-02-01 DIAGNOSIS — E039 Hypothyroidism, unspecified: Secondary | ICD-10-CM

## 2024-02-01 DIAGNOSIS — J471 Bronchiectasis with (acute) exacerbation: Secondary | ICD-10-CM

## 2024-02-01 LAB — LIPID PANEL
Cholesterol: 154 mg/dL (ref 0–200)
HDL: 50.9 mg/dL (ref >39.00)
LDL Cholesterol: 84 mg/dL (ref 0–99)
NonHDL: 102.61
Total CHOL/HDL Ratio: 3
Triglycerides: 92 mg/dL (ref 0.0–149.0)
VLDL: 18.4 mg/dL (ref 0.0–40.0)

## 2024-02-01 LAB — CBC
HCT: 38.1 % (ref 36.0–46.0)
Hemoglobin: 12.6 g/dL (ref 12.0–15.0)
MCHC: 33.1 g/dL (ref 30.0–36.0)
MCV: 94.2 fl (ref 78.0–100.0)
Platelets: 252 K/uL (ref 150.0–400.0)
RBC: 4.04 Mil/uL (ref 3.87–5.11)
RDW: 13.7 % (ref 11.5–15.5)
WBC: 11.1 K/uL — ABNORMAL HIGH (ref 4.0–10.5)

## 2024-02-01 LAB — T4, FREE: Free T4: 1.38 ng/dL (ref 0.60–1.60)

## 2024-02-01 LAB — TSH: TSH: 1.25 u[IU]/mL (ref 0.35–5.50)

## 2024-02-01 MED ORDER — DOXYCYCLINE HYCLATE 100 MG PO TABS: 100.0000 mg | ORAL_TABLET | Freq: Two times a day (BID) | ORAL | 0 refills | Status: DC

## 2024-02-01 MED ORDER — DIAZEPAM 5 MG PO TABS: 5.0000 mg | ORAL_TABLET | Freq: Two times a day (BID) | ORAL | 0 refills | Status: DC | PRN

## 2024-02-01 MED ORDER — PROMETHAZINE-DM 6.25-15 MG/5ML PO SYRP: 5.0000 mL | ORAL_SOLUTION | Freq: Four times a day (QID) | ORAL | 0 refills | Status: AC | PRN

## 2024-02-01 MED ORDER — CEPHALEXIN 500 MG PO CAPS: 500.0000 mg | ORAL_CAPSULE | Freq: Two times a day (BID) | ORAL | 0 refills | Status: AC

## 2024-02-01 NOTE — Telephone Encounter (Signed)
 ATC home # and left VM to return call. ATC cell # and left a VM to return call.   cephALEXin  (KEFLEX ) 500 MG capsule 500 mg, 2 times daily 0 ordered        Summary: Take 1 capsule (500 mg total) by mouth 2 (two) times daily for 7 days., Starting Tue 02/01/2024, Until Tue 02/08/2024, Normal Dose, Route, Frequency: 500 mg, Oral, 2 times dailyStart: 09/16/2025End: 09/23/2025Ordered On: 09/16/2025[id number] [place], [place] - [address]ReportDx Associated: Taking: Long-term: Med Note:        Change Discontinue Directions: Take 1 capsule (500 mg total) by mouth 2 (two) times daily for 7 days. Ordering Department: [facility] Authorized By: Rollene Almarie LABOR, MD Dispense: 14 capsule     Looks like her PCP sent something in for her.

## 2024-02-01 NOTE — Assessment & Plan Note (Signed)
 Not taking omeprazole  currently.

## 2024-02-01 NOTE — Assessment & Plan Note (Signed)
Checking lipid panel and adjust as needed simvastatin.

## 2024-02-01 NOTE — Patient Instructions (Addendum)
We have sent in the keflex to take 1 pill twice a day for 1 week.

## 2024-02-01 NOTE — Assessment & Plan Note (Signed)
 With flare rx keflex  1 week supply. She has taken augmentin  recently and doxycycline  she did not fill from pulmonary but this bothered her stomach last time she took it.

## 2024-02-01 NOTE — Telephone Encounter (Signed)
 Pharmacy Patient Advocate Encounter   Received notification from CoverMyMeds that prior authorization for diazePAM  5MG  tablets  is required/requested.   Insurance verification completed.   The patient is insured through [facility] MEDICAID .   Per test claim: PA required; PA submitted to above mentioned insurance via Latent Key/confirmation #/EOC [id number] Status is pending

## 2024-02-01 NOTE — Progress Notes (Signed)
 Subjective:   Patient ID: Marilyn Blake, female    DOB: 1941-01-01, 83 y.o.   MRN: [id number]  Discussed the use of AI scribe software for clinical note transcription with the patient, who gave verbal consent to proceed.  History of Present Illness Marilyn Blake is an 83 year old female who presents with a persistent cough and respiratory symptoms.  She has been experiencing a persistent cough for three weeks, which is most severe at night. Despite completing a course of amoxicillin , her symptoms have not improved. She uses Nyquil at night and Mucinex during the day to manage her symptoms. She also has a nebulizer and uses breathing treatments as needed. The cough is associated with mucus production and a sensation of throat 'choking up' at night. Pain accompanies the coughing, which her daughter attributes to the coughing itself. She has a history of sinus issues and is concerned about potential allergens in her home environment.  She has a history of anxiety and has been using diazepam  as needed since last September, following the loss of her husband. She prefers not to take daily medication for anxiety and manages her symptoms with diazepam  when necessary. She has experienced jitteriness after a change in her levothyroxine  dosage, which she has been on for 28 years without prior issues.  She expresses concern about her current pharmacy's communication regarding prescriptions and is considering switching pharmacies. She has previously taken doxycycline , which upset her stomach.  Review of Systems  Constitutional: Negative.   HENT:  Positive for congestion and postnasal drip.   Eyes: Negative.   Respiratory:  Positive for cough. Negative for chest tightness and shortness of breath.   Cardiovascular:  Negative for chest pain, palpitations and leg swelling.  Gastrointestinal:  Negative for abdominal distention, abdominal pain, constipation, diarrhea, nausea and vomiting.  Musculoskeletal:  Negative.   Skin: Negative.   Neurological: Negative.   Psychiatric/Behavioral: Negative.      Objective:  Physical Exam Constitutional:      Appearance: She is well-developed.  HENT:     Head: Normocephalic and atraumatic.     Comments: Oropharynx with redness and clear drainage, nose with swollen turbinates, TMs normal bilaterally.  Neck:     Thyroid : No thyromegaly.  Cardiovascular:     Rate and Rhythm: Normal rate and regular rhythm.  Pulmonary:     Effort: Pulmonary effort is normal. No respiratory distress.     Breath sounds: Normal breath sounds. No wheezing or rales.     Comments: Stable lung exam, coughing during visit Abdominal:     General: Bowel sounds are normal. There is no distension.     Palpations: Abdomen is soft.     Tenderness: There is no abdominal tenderness.  Musculoskeletal:        General: No tenderness.     Cervical back: Normal range of motion.  Lymphadenopathy:     Cervical: No cervical adenopathy.  Skin:    General: Skin is warm and dry.  Neurological:     Mental Status: She is alert and oriented to person, place, and time.     Coordination: Coordination normal.     Vitals:   02/01/24 1354  BP: 122/80  Pulse: 87  Temp: 97.6 F (36.4 C)  TempSrc: Oral  SpO2: 93%  Weight: 101 lb (45.8 kg)  Height: 5' 1 (1.549 m)  Flu shot given at visit  Assessment and Plan Assessment & Plan Chronic cough with recurrent respiratory infections in setting of bronchiectasis She has a persistent cough with  recurrent infections. Previous treatment with amoxicillin  was ineffective. Consider environmental allergens or mycobacterial infection as potential causes. Her cough worsens at night with mucus and throat discomfort. She has not received doxycycline ; however, cefalexin is preferred. Prescribe cefalexin for the respiratory infection and refill her prescription cough medicine. Consider an environmental assessment of her home for potential allergens or  irritants.  Hypothyroidism   A recent change in her levothyroxine  dosage may be affecting her anxiety. She had long-term stability on the previous dose. Check thyroid  function tests to assess the current levothyroxine  dosage.  Anxiety disorder   She experiences anxiety and prefers as-needed medication. Diazepam  is effective without side effects. Non-pharmacological strategies were discussed. Continue diazepam  as needed for anxiety and encourage the use of non-pharmacological coping strategies.

## 2024-02-01 NOTE — Assessment & Plan Note (Signed)
 Uses diazepam  rarely and refilled today to fill when needed. We discussed if daily medicine was needed but she doesnot have daily symptoms and discussed coping strategies.

## 2024-02-01 NOTE — Assessment & Plan Note (Signed)
Checking TSH and adjust levothyroxine as needed.

## 2024-02-02 ENCOUNTER — Telehealth: Payer: Self-pay

## 2024-02-02 NOTE — Telephone Encounter (Signed)
 Please see other encounter,. This was resolved. NFN

## 2024-02-02 NOTE — Telephone Encounter (Signed)
 Copied from CRM [id number]. Topic: Clinical - Medical Advice >> Feb 02, 2024  8:27 AM Isabell A wrote: Reason for CRM: Patient returning phone call from [facility] - would like to confirm which medication she should be taking.   Callback number: [phone]  I called and spoke to pt. I informed her that we would not send in Doxycycline  since her pcp sent in Cephalexin . Pt verbalized understanding. NFN

## 2024-02-05 LAB — COMPREHENSIVE METABOLIC PANEL WITH GFR
ALT: 14 U/L (ref 0–35)
AST: 23 U/L (ref 0–37)
Albumin: 4.1 g/dL (ref 3.5–5.2)
Alkaline Phosphatase: 96 U/L (ref 39–117)
BUN: 11 mg/dL (ref 6–23)
CO2: 28 meq/L (ref 19–32)
Calcium: 9.9 mg/dL (ref 8.4–10.5)
Chloride: 99 meq/L (ref 96–112)
Creatinine, Ser: 0.64 mg/dL (ref 0.40–1.20)
GFR: 81.05 mL/min (ref >60.00)
Glucose, Bld: 88 mg/dL (ref 70–99)
Potassium: 3.9 meq/L (ref 3.5–5.1)
Sodium: 134 meq/L — ABNORMAL LOW (ref 135–145)
Total Bilirubin: 0.4 mg/dL (ref 0.2–1.2)
Total Protein: 8 g/dL (ref 6.0–8.3)

## 2024-02-08 ENCOUNTER — Ambulatory Visit: Admitting: Internal Medicine

## 2024-02-09 ENCOUNTER — Other Ambulatory Visit (HOSPITAL_COMMUNITY): Payer: Self-pay

## 2024-02-09 NOTE — Telephone Encounter (Signed)
 Pharmacy Patient Advocate Encounter  Received notification from [employer] that Prior Authorization for diazePAM  5MG  tablets  has been APPROVED from 02/09/2024 to 03/10/2024   PA #/Case ID/Reference #: [id number]

## 2024-02-28 NOTE — Progress Notes (Unsigned)
 Patient ID: Marilyn Blake, female    DOB: 13-Feb-1941, 83 y.o.   MRN: [id number]  HPI 02/03/2011-83 year old female never smoker followed for bronchiectasis, history of MAIC bronchitis, allergic rhinitis.   --------------------------------------------------------------------------------------    08/06/23- 83 year old female never smoker followed for bronchiectasis, recurrent MAIC bronchitis, allergic rhinitis, insomnia, complicated by hypothyroid, IBS -Singulair , Discussed the use of AI scribe software for clinical note transcription with the patient, who gave verbal consent to proceed.  History of Present Illness   The patient, with a history of chronic mucus production and recent sinus infection, presents with persistent sinus symptoms despite a 7-day course of Keflex  completed around March 10th. Symptoms began in mid-February and include headaches across the front of the head, which are relieved by pressure, and nosebleeds from both nostrils. The patient also reports a sensation of fullness in the ears and uses Flonase and saline for relief.  In addition to the sinus symptoms, the patient reports chest pain when lying down, which she describes as located in the middle of the breastbone. The pain does not worsen with deep breaths. The patient's chronic mucus production has not changed with the sinus infection. The patient also reports recent stomach cramps, for which she has resumed taking a stomach medication.     CXR 11/13/22 IMPRESSION: 1. No radiographic evidence of acute cardiopulmonary disease. 2. COPD. 3. Chronic fibrosis/scarring.   Assessment and Plan:   Chronic Bronchitis with exacerbation Chronic Sinusitis Persistent sinusitis with headaches, nasal congestion, and epistaxis. Initial treatment with Keflex  ineffective. Augmentin  preferred for efficacy. CT scan warranted for persistent symptoms. - Prescribe Augmentin  with food for a short course. - Order limited CT scan of  sinuses. - Advise against Flonase; continue saline nasal spray. - Continue neti pot for nasal irrigation.  Chronic Cough and Mucus Production Chronic cough and mucus production unchanged with sinus infection. Monitoring required due to lung scarring. - Order chest x-ray to monitor changes.  Chest Pain Chest pain likely due to acid reflux, not cardiac or pulmonary causes. - Recommend over-the-counter Pepcid  once daily before breakfast.     02/29/24-  83 year old female never smoker followed for bronchiectasis, recurrent MAIC bronchitis, allergic rhinitis, insomnia, complicated by hypothyroid, IBS -Singulair ,  CT sinus 08/11/23 IMPRESSION: 1. Normally aerated paranasal sinuses. Patent sinus drainage pathways. 2. Nasal septum bows 3-4 mm towards the right. CXR 08/06/23-   IMPRESSION: 1. No acute cardiopulmonary disease. 2. Stable changes of interstitial fibrosis, bronchiectasis and more confluent areas of scarring.  Review of Systems- see HPI Constitutional:   No-   weight loss, +night sweats, , chills, +fatigue, lassitude. HEENT:   +  headaches, difficulty swallowing, tooth/dental problems, sore throat,       No-  sneezing, itching, ear ache, +nasal congestion, post nasal drip,  CV:  atypicalchest pain, orthopnea, PND, swelling in lower extremities, anasarca,dizziness, palpitations Resp: + shortness of breath with exertion or at rest.             + productive cough,   non-productive cough,  no- coughing up of blood.              No- change in color of mucus.  No- wheezing.   Skin: No-   rash or lesions. GI:  No-   heartburn, indigestion, abdominal pain, nausea, vomiting,  GU:  MS:  No-   joint pain or swelling. . Neuro- grossly normal to observation,   Psych:  No- change in mood or affect. No depression or anxiety.  No memory loss.  Objective:   Physical Exam General- Alert, Oriented, Affect-appropriate, Distress- none acute, petite/ thin lady/ frail Skin- rash-none,  lesions- none, excoriation- none Lymphadenopathy- none Head- atraumatic            Eyes- Gross vision intact, PERRLA, conjunctivae clear secretions            Ears-TMs and canals- normal and clear            Nose- Clear, No-Septal dev, mucus, polyps, erosion, perforation             Throat- Mallampati II , mucosa + red tonsil crypts/ no exudate , derainage- none, tonsils- atrophic, Neck- flexible , trachea midline, no stridor , thyroid  nl, carotid no bruit Chest - symmetrical excursion , unlabored           Heart/CV- RRR , no murmur , no gallop  , no rub, nl s1 s2                           - JVD- none , edema- none, stasis changes- none, varices- none           Lung-  Unlabored,+ few scattered crackles,  wheeze-none, cough+dry , dullness-none, rub-none           Chest wall-  Abd- Br/ Gen/ Rectal- Not done, not indicated Extrem- cyanosis- none, clubbing, none, atrophy- none, strength- nl Neuro- grossly intact to observation

## 2024-02-29 ENCOUNTER — Ambulatory Visit

## 2024-02-29 ENCOUNTER — Encounter: Payer: Self-pay | Admitting: Internal Medicine

## 2024-02-29 ENCOUNTER — Ambulatory Visit: Admitting: Internal Medicine

## 2024-02-29 VITALS — BP 138/74 | HR 81 | Temp 97.5°F | Ht 62.0 in | Wt 103.6 lb

## 2024-02-29 DIAGNOSIS — J329 Chronic sinusitis, unspecified: Secondary | ICD-10-CM

## 2024-02-29 DIAGNOSIS — R079 Chest pain, unspecified: Secondary | ICD-10-CM | POA: Diagnosis not present

## 2024-02-29 DIAGNOSIS — R053 Chronic cough: Secondary | ICD-10-CM

## 2024-02-29 DIAGNOSIS — J471 Bronchiectasis with (acute) exacerbation: Secondary | ICD-10-CM

## 2024-02-29 DIAGNOSIS — J479 Bronchiectasis, uncomplicated: Secondary | ICD-10-CM

## 2024-02-29 NOTE — Patient Instructions (Signed)
 Order- CXR   dx Bronchiectasis  Speak with Dr Rollene about the soreness in your chest  Go back to using your flutter valve. See if it helps clear some mucus from your chest.

## 2024-03-05 ENCOUNTER — Ambulatory Visit: Payer: Self-pay | Admitting: Internal Medicine

## 2024-04-17 ENCOUNTER — Other Ambulatory Visit: Payer: Self-pay | Admitting: Internal Medicine

## 2024-04-17 DIAGNOSIS — Z1231 Encounter for screening mammogram for malignant neoplasm of breast: Secondary | ICD-10-CM

## 2024-05-16 ENCOUNTER — Telehealth: Payer: Self-pay

## 2024-05-16 NOTE — Telephone Encounter (Signed)
 Copied from CRM [id number]. Topic: Appointments - Scheduling Inquiry for Clinic >> May 16, 2024  9:58 AM [name] wrote: Reason for CRM: Patient states she was told by Dr. Neysa that she needs to follow up with her PCP regarding her chronic bronchitis. States that she was given medication to treat but it is not helping, just took the last tablet today. Please reach out, no appointments available on my end and patient declined NT. Only wants to see PCP - [phone] >> May 16, 2024  1:36 PM Clotilda DASEN wrote: Clinic will contact patient

## 2024-05-19 NOTE — Telephone Encounter (Signed)
 LVM for pt to call office

## 2024-05-23 ENCOUNTER — Ambulatory Visit
Admission: RE | Admit: 2024-05-23 | Discharge: 2024-05-23 | Disposition: A | Source: Ambulatory Visit | Attending: Internal Medicine | Admitting: Internal Medicine

## 2024-05-23 DIAGNOSIS — Z1231 Encounter for screening mammogram for malignant neoplasm of breast: Secondary | ICD-10-CM

## 2024-05-25 ENCOUNTER — Ambulatory Visit: Admitting: Podiatry

## 2024-05-26 ENCOUNTER — Ambulatory Visit: Payer: Self-pay | Admitting: Internal Medicine

## 2024-05-27 ENCOUNTER — Other Ambulatory Visit: Payer: Self-pay | Admitting: Internal Medicine

## 2024-05-29 ENCOUNTER — Ambulatory Visit: Admitting: Internal Medicine

## 2024-06-06 ENCOUNTER — Ambulatory Visit: Admitting: Internal Medicine

## 2024-06-06 ENCOUNTER — Encounter: Payer: Self-pay | Admitting: Internal Medicine

## 2024-06-06 VITALS — BP 100/60 | HR 81 | Temp 97.6°F | Ht 62.0 in | Wt 105.2 lb

## 2024-06-06 DIAGNOSIS — K219 Gastro-esophageal reflux disease without esophagitis: Secondary | ICD-10-CM

## 2024-06-06 DIAGNOSIS — R0602 Shortness of breath: Secondary | ICD-10-CM

## 2024-06-06 DIAGNOSIS — J479 Bronchiectasis, uncomplicated: Secondary | ICD-10-CM

## 2024-06-06 LAB — COMPREHENSIVE METABOLIC PANEL WITH GFR
ALT: 12 U/L (ref 3–35)
AST: 23 U/L (ref 5–37)
Albumin: 4.2 g/dL (ref 3.5–5.2)
Alkaline Phosphatase: 98 U/L (ref 39–117)
BUN: 11 mg/dL (ref 6–23)
CO2: 32 meq/L (ref 19–32)
Calcium: 9.8 mg/dL (ref 8.4–10.5)
Chloride: 98 meq/L (ref 96–112)
Creatinine, Ser: 0.57 mg/dL (ref 0.40–1.20)
GFR: 83.14 mL/min
Glucose, Bld: 86 mg/dL (ref 70–99)
Potassium: 4 meq/L (ref 3.5–5.1)
Sodium: 136 meq/L (ref 135–145)
Total Bilirubin: 0.3 mg/dL (ref 0.2–1.2)
Total Protein: 8.5 g/dL — ABNORMAL HIGH (ref 6.0–8.3)

## 2024-06-06 LAB — CBC
HCT: 40.1 % (ref 36.0–46.0)
Hemoglobin: 13.3 g/dL (ref 12.0–15.0)
MCHC: 33.3 g/dL (ref 30.0–36.0)
MCV: 93.7 fl (ref 78.0–100.0)
Platelets: 225 K/uL (ref 150.0–400.0)
RBC: 4.28 Mil/uL (ref 3.87–5.11)
RDW: 13.7 % (ref 11.5–15.5)
WBC: 10.9 K/uL — ABNORMAL HIGH (ref 4.0–10.5)

## 2024-06-06 LAB — BRAIN NATRIURETIC PEPTIDE: Pro B Natriuretic peptide (BNP): 50 pg/mL (ref 1.0–100.0)

## 2024-06-06 NOTE — Patient Instructions (Addendum)
 We have done the EKG which is normal and will do the labs and echocardiogram.

## 2024-06-06 NOTE — Progress Notes (Unsigned)
 "  Subjective:   Patient ID: Marilyn Blake, female    DOB: 1940/10/11, 84 y.o.   MRN: [id number]  Discussed the use of AI scribe software for clinical note transcription with the patient, who gave verbal consent to proceed.  History of Present Illness Marilyn Blake is an 84 year old female with lung disease who presents with chest pain and shortness of breath.  She has been experiencing intermittent chest pain for approximately three months. The pain varies in duration, lasting from a few minutes to all day, and occurs without specific triggers. She initially mentioned this pain in October, and a chest x-ray was performed. She was advised to take Pepcid , which did not alleviate the pain, but finds methadone to be more effective.  She experiences significant shortness of breath, particularly when climbing stairs, which requires her to stop and rest. This symptom has been present since around Christmas, coinciding with a period of illness. She feels very winded and weak, and notes that she has to sit down more often when performing activities such as cooking. She has not been very active since Christmas due to the cold weather.  Her past medical history includes lung disease diagnosed in 2005, with no significant symptoms until recently. She recalls having a stress test and echocardiogram approximately seven to eight years ago. She has a history of coughing up sputum, though this is rare.  She is currently using breathing treatments but has been using a reduced dose. She was previously taking cold and cough medication but discontinued it.  EKG: Rate 79, axis normal, interval normal, sinus, no st or t wave changes mild LAE not new, no significant change compared to prior 2021  Review of Systems  Constitutional:  Positive for activity change and fatigue.  HENT: Negative.    Eyes: Negative.   Respiratory:  Positive for cough and shortness of breath. Negative for chest tightness.   Cardiovascular:   Positive for chest pain. Negative for palpitations and leg swelling.  Gastrointestinal:  Negative for abdominal distention, abdominal pain, constipation, diarrhea, nausea and vomiting.  Musculoskeletal: Negative.   Skin: Negative.   Neurological: Negative.   Psychiatric/Behavioral: Negative.      Objective:  Physical Exam Constitutional:      Appearance: She is well-developed.  HENT:     Head: Normocephalic and atraumatic.  Cardiovascular:     Rate and Rhythm: Normal rate and regular rhythm.  Pulmonary:     Effort: Pulmonary effort is normal. No respiratory distress.     Breath sounds: Normal breath sounds. No wheezing or rales.  Abdominal:     General: Bowel sounds are normal. There is no distension.     Palpations: Abdomen is soft.     Tenderness: There is no abdominal tenderness.  Musculoskeletal:     Cervical back: Normal range of motion.  Skin:    General: Skin is warm and dry.  Neurological:     Mental Status: She is alert and oriented to person, place, and time.     Coordination: Coordination normal.     Vitals:   06/06/24 1411  BP: 100/60  Pulse: 81  Temp: 97.6 F (36.4 C)  TempSrc: Oral  SpO2: 92%  Weight: 105 lb 3.2 oz (47.7 kg)  Height: 5' 2 (1.575 m)    Assessment and Plan Assessment & Plan Bronchiectasis   She has experienced intermittent chest pain and dyspnea for three months, worsened by exertion. A recent chest x-ray revealed increased scarring. The differential diagnosis includes cardiac  issues, with previous stress test and echocardiogram conducted 7-8 years ago. An EKG was performed and blood work ordered. An echocardiogram will be considered if necessary. A high-resolution CT scan will be considered if cardiac evaluation is normal and symptoms persist. Arrangements are being made for a new pulmonologist due to the retirement of her current provider.  Shortness of breath on exertion EKG done today to assess and no changes since 2021. We will  check CBC and CMP and BNP. Depending on results we have planned to recheck an echo (prior grade 1 diastolic dysfunction) to check for changes. If echo stable may need further pulmonary assessment last CT >10 years ago and she does have bronchiectasis which can progress to fibrosis.   GERD She has continued omeprazole  and addition of pepcid  did not help. She does not have symptoms and we will continue omeprazole .    "

## 2024-06-07 ENCOUNTER — Other Ambulatory Visit: Payer: Self-pay

## 2024-06-07 ENCOUNTER — Ambulatory Visit: Payer: Self-pay | Admitting: Internal Medicine

## 2024-06-07 DIAGNOSIS — R0602 Shortness of breath: Secondary | ICD-10-CM

## 2024-06-07 MED ORDER — SIMVASTATIN 20 MG PO TABS: 20.0000 mg | ORAL_TABLET | Freq: Every day | ORAL | 3 refills | Status: AC

## 2024-06-09 MED ORDER — DIAZEPAM 5 MG PO TABS: 5.0000 mg | ORAL_TABLET | Freq: Two times a day (BID) | ORAL | 0 refills | Status: AC | PRN

## 2024-06-29 ENCOUNTER — Ambulatory Visit: Admitting: Podiatry

## 2024-07-11 ENCOUNTER — Other Ambulatory Visit (HOSPITAL_BASED_OUTPATIENT_CLINIC_OR_DEPARTMENT_OTHER)

## 2025-01-10 ENCOUNTER — Ambulatory Visit
